# Patient Record
Sex: Female | Born: 1965 | Race: Black or African American | Hispanic: No | Marital: Single | Smoking: Never smoker
Health system: Southern US, Community
[De-identification: ages and names within clinical notes are randomized; demographics above are authoritative.]

## PROBLEM LIST (undated history)

## (undated) DIAGNOSIS — I10 Essential (primary) hypertension: Secondary | ICD-10-CM

## (undated) DIAGNOSIS — J309 Allergic rhinitis, unspecified: Secondary | ICD-10-CM

## (undated) DIAGNOSIS — E785 Hyperlipidemia, unspecified: Secondary | ICD-10-CM

## (undated) DIAGNOSIS — D649 Anemia, unspecified: Secondary | ICD-10-CM

## (undated) DIAGNOSIS — E559 Vitamin D deficiency, unspecified: Secondary | ICD-10-CM

## (undated) DIAGNOSIS — E113293 Type 2 diabetes mellitus with mild nonproliferative diabetic retinopathy without macular edema, bilateral: Secondary | ICD-10-CM

## (undated) DIAGNOSIS — K219 Gastro-esophageal reflux disease without esophagitis: Secondary | ICD-10-CM

## (undated) DIAGNOSIS — E119 Type 2 diabetes mellitus without complications: Secondary | ICD-10-CM

## (undated) HISTORY — PX: ENDOMETRIAL BIOPSY: SHX622

## (undated) HISTORY — DX: Type 2 diabetes mellitus without complications: E11.9

## (undated) HISTORY — DX: Allergic rhinitis, unspecified: J30.9

## (undated) HISTORY — DX: Hyperlipidemia, unspecified: E78.5

## (undated) HISTORY — DX: Gastro-esophageal reflux disease without esophagitis: K21.9

## (undated) HISTORY — DX: Vitamin D deficiency, unspecified: E55.9

## (undated) HISTORY — DX: Anemia, unspecified: D64.9

## (undated) HISTORY — DX: Essential (primary) hypertension: I10

## (undated) HISTORY — DX: Type 2 diabetes mellitus with mild nonproliferative diabetic retinopathy without macular edema, bilateral: E11.3293

---

## 1999-02-08 ENCOUNTER — Ambulatory Visit (HOSPITAL_COMMUNITY): Admission: RE | Admit: 1999-02-08 | Discharge: 1999-02-08 | Payer: Self-pay | Admitting: Pulmonary Disease

## 1999-02-08 ENCOUNTER — Encounter: Payer: Self-pay | Admitting: Pulmonary Disease

## 1999-02-11 ENCOUNTER — Encounter: Payer: Self-pay | Admitting: Pulmonary Disease

## 1999-02-11 ENCOUNTER — Ambulatory Visit (HOSPITAL_COMMUNITY): Admission: RE | Admit: 1999-02-11 | Discharge: 1999-02-11 | Payer: Self-pay | Admitting: Pulmonary Disease

## 1999-08-06 ENCOUNTER — Other Ambulatory Visit: Admission: RE | Admit: 1999-08-06 | Discharge: 1999-08-06 | Payer: Self-pay | Admitting: Obstetrics and Gynecology

## 2000-08-18 ENCOUNTER — Other Ambulatory Visit: Admission: RE | Admit: 2000-08-18 | Discharge: 2000-08-18 | Payer: Self-pay | Admitting: Obstetrics and Gynecology

## 2001-02-18 ENCOUNTER — Encounter (INDEPENDENT_AMBULATORY_CARE_PROVIDER_SITE_OTHER): Payer: Self-pay

## 2001-02-18 ENCOUNTER — Other Ambulatory Visit: Admission: RE | Admit: 2001-02-18 | Discharge: 2001-02-18 | Payer: Self-pay | Admitting: Obstetrics and Gynecology

## 2004-08-05 ENCOUNTER — Ambulatory Visit: Payer: Self-pay | Admitting: Family Medicine

## 2004-08-05 ENCOUNTER — Ambulatory Visit: Payer: Self-pay | Admitting: *Deleted

## 2005-02-24 ENCOUNTER — Ambulatory Visit (HOSPITAL_COMMUNITY): Admission: RE | Admit: 2005-02-24 | Discharge: 2005-02-24 | Payer: Self-pay | Admitting: Family Medicine

## 2005-02-24 ENCOUNTER — Ambulatory Visit: Payer: Self-pay | Admitting: Family Medicine

## 2005-03-06 ENCOUNTER — Ambulatory Visit (HOSPITAL_COMMUNITY): Admission: RE | Admit: 2005-03-06 | Discharge: 2005-03-06 | Payer: Self-pay | Admitting: Family Medicine

## 2005-08-20 ENCOUNTER — Ambulatory Visit: Payer: Self-pay | Admitting: Family Medicine

## 2005-11-09 ENCOUNTER — Ambulatory Visit: Payer: Self-pay | Admitting: Family Medicine

## 2006-01-27 ENCOUNTER — Ambulatory Visit: Payer: Self-pay | Admitting: Family Medicine

## 2006-01-28 ENCOUNTER — Ambulatory Visit: Payer: Self-pay | Admitting: Family Medicine

## 2006-02-02 ENCOUNTER — Ambulatory Visit: Payer: Self-pay | Admitting: Family Medicine

## 2006-06-18 ENCOUNTER — Ambulatory Visit: Payer: Self-pay | Admitting: Family Medicine

## 2006-08-02 ENCOUNTER — Ambulatory Visit: Payer: Self-pay | Admitting: Family Medicine

## 2006-10-27 ENCOUNTER — Ambulatory Visit: Payer: Self-pay | Admitting: Family Medicine

## 2007-01-03 ENCOUNTER — Ambulatory Visit: Payer: Self-pay | Admitting: Family Medicine

## 2007-01-17 ENCOUNTER — Ambulatory Visit: Payer: Self-pay | Admitting: Family Medicine

## 2007-02-22 ENCOUNTER — Ambulatory Visit: Payer: Self-pay | Admitting: Family Medicine

## 2007-05-25 ENCOUNTER — Encounter (INDEPENDENT_AMBULATORY_CARE_PROVIDER_SITE_OTHER): Payer: Self-pay | Admitting: *Deleted

## 2007-05-30 ENCOUNTER — Ambulatory Visit: Payer: Self-pay | Admitting: Internal Medicine

## 2007-06-01 ENCOUNTER — Ambulatory Visit: Payer: Self-pay | Admitting: Internal Medicine

## 2007-06-06 ENCOUNTER — Encounter: Payer: Self-pay | Admitting: Family Medicine

## 2007-06-06 ENCOUNTER — Ambulatory Visit: Payer: Self-pay | Admitting: Internal Medicine

## 2007-06-06 LAB — CONVERTED CEMR LAB
ALT: 17 units/L (ref 0–35)
Albumin: 3.6 g/dL (ref 3.5–5.2)
Alkaline Phosphatase: 39 units/L (ref 39–117)
BUN: 17 mg/dL (ref 6–23)
Basophils Absolute: 0 10*3/uL (ref 0.0–0.1)
Basophils Relative: 0 % (ref 0–1)
Chlamydia, DNA Probe: NEGATIVE
Eosinophils Relative: 1 % (ref 0–5)
Hemoglobin: 11.8 g/dL — ABNORMAL LOW (ref 12.0–15.0)
Lymphocytes Relative: 31 % (ref 12–46)
MCHC: 31.1 g/dL (ref 30.0–36.0)
Monocytes Absolute: 0.5 10*3/uL (ref 0.2–0.7)
Neutro Abs: 4.1 10*3/uL (ref 1.7–7.7)
Neutrophils Relative %: 61 % (ref 43–77)
Platelets: 319 10*3/uL (ref 150–400)
Potassium: 4.3 meq/L (ref 3.5–5.3)
RBC: 4.4 M/uL (ref 3.87–5.11)
RDW: 14.5 % — ABNORMAL HIGH (ref 11.5–14.0)
Sodium: 141 meq/L (ref 135–145)
Total Bilirubin: 0.3 mg/dL (ref 0.3–1.2)
Total Protein: 5.8 g/dL — ABNORMAL LOW (ref 6.0–8.3)
Triglycerides: 88 mg/dL (ref <150)
WBC: 6.8 10*3/uL (ref 4.0–10.5)

## 2007-10-19 ENCOUNTER — Ambulatory Visit: Payer: Self-pay | Admitting: Internal Medicine

## 2007-10-27 ENCOUNTER — Ambulatory Visit: Payer: Self-pay | Admitting: Internal Medicine

## 2007-11-21 ENCOUNTER — Ambulatory Visit: Payer: Self-pay | Admitting: Internal Medicine

## 2007-12-19 ENCOUNTER — Ambulatory Visit: Payer: Self-pay | Admitting: Internal Medicine

## 2008-01-23 ENCOUNTER — Ambulatory Visit: Payer: Self-pay | Admitting: Internal Medicine

## 2008-01-23 ENCOUNTER — Encounter (INDEPENDENT_AMBULATORY_CARE_PROVIDER_SITE_OTHER): Payer: Self-pay | Admitting: Family Medicine

## 2008-01-23 LAB — CONVERTED CEMR LAB
Albumin: 4 g/dL (ref 3.5–5.2)
BUN: 13 mg/dL (ref 6–23)
Calcium: 9.2 mg/dL (ref 8.4–10.5)
Cholesterol: 175 mg/dL (ref 0–200)
LDL Cholesterol: 85 mg/dL (ref 0–99)
Potassium: 3.9 meq/L (ref 3.5–5.3)
Total CHOL/HDL Ratio: 2.9
Total Protein: 7 g/dL (ref 6.0–8.3)
VLDL: 30 mg/dL (ref 0–40)

## 2008-01-24 ENCOUNTER — Ambulatory Visit (HOSPITAL_COMMUNITY): Admission: RE | Admit: 2008-01-24 | Discharge: 2008-01-24 | Payer: Self-pay | Admitting: Family Medicine

## 2008-02-13 ENCOUNTER — Ambulatory Visit: Payer: Self-pay | Admitting: Internal Medicine

## 2008-02-28 ENCOUNTER — Ambulatory Visit: Payer: Self-pay | Admitting: Internal Medicine

## 2008-03-11 ENCOUNTER — Emergency Department (HOSPITAL_COMMUNITY): Admission: EM | Admit: 2008-03-11 | Discharge: 2008-03-11 | Payer: Self-pay | Admitting: *Deleted

## 2008-04-02 ENCOUNTER — Ambulatory Visit: Payer: Self-pay | Admitting: Family Medicine

## 2008-06-11 ENCOUNTER — Ambulatory Visit: Payer: Self-pay | Admitting: Internal Medicine

## 2008-06-26 ENCOUNTER — Encounter (INDEPENDENT_AMBULATORY_CARE_PROVIDER_SITE_OTHER): Payer: Self-pay | Admitting: Family Medicine

## 2008-06-26 ENCOUNTER — Ambulatory Visit: Payer: Self-pay | Admitting: Family Medicine

## 2008-06-26 LAB — CONVERTED CEMR LAB
Basophils Absolute: 0 10*3/uL (ref 0.0–0.1)
CO2: 23 meq/L (ref 19–32)
Eosinophils Absolute: 0.1 10*3/uL (ref 0.0–0.7)
Eosinophils Relative: 1 % (ref 0–5)
HCT: 37.1 % (ref 36.0–46.0)
Helicobacter Pylori Antibody-IgG: 0.4
Hemoglobin: 11.6 g/dL — ABNORMAL LOW (ref 12.0–15.0)
Lymphocytes Relative: 25 % (ref 12–46)
Lymphs Abs: 2 10*3/uL (ref 0.7–4.0)
MCHC: 31.3 g/dL (ref 30.0–36.0)
MCV: 85.7 fL (ref 78.0–100.0)
Monocytes Absolute: 0.6 10*3/uL (ref 0.1–1.0)
Monocytes Relative: 7 % (ref 3–12)
Neutro Abs: 5.5 10*3/uL (ref 1.7–7.7)
Potassium: 3.9 meq/L (ref 3.5–5.3)
RBC: 4.33 M/uL (ref 3.87–5.11)
TSH: 1.01 microintl units/mL (ref 0.350–4.50)
Total Protein: 6.4 g/dL (ref 6.0–8.3)
WBC: 8.2 10*3/uL (ref 4.0–10.5)

## 2008-07-09 ENCOUNTER — Ambulatory Visit (HOSPITAL_COMMUNITY): Admission: RE | Admit: 2008-07-09 | Discharge: 2008-07-09 | Payer: Self-pay | Admitting: Family Medicine

## 2008-08-20 ENCOUNTER — Ambulatory Visit (HOSPITAL_COMMUNITY): Admission: RE | Admit: 2008-08-20 | Discharge: 2008-08-20 | Payer: Self-pay | Admitting: Internal Medicine

## 2008-08-20 ENCOUNTER — Ambulatory Visit: Payer: Self-pay | Admitting: Internal Medicine

## 2008-09-17 ENCOUNTER — Ambulatory Visit (HOSPITAL_COMMUNITY): Admission: RE | Admit: 2008-09-17 | Discharge: 2008-09-17 | Payer: Self-pay | Admitting: Internal Medicine

## 2008-10-25 ENCOUNTER — Encounter (INDEPENDENT_AMBULATORY_CARE_PROVIDER_SITE_OTHER): Payer: Self-pay | Admitting: Family Medicine

## 2008-10-25 ENCOUNTER — Ambulatory Visit: Payer: Self-pay | Admitting: Internal Medicine

## 2008-10-25 LAB — CONVERTED CEMR LAB

## 2008-11-26 ENCOUNTER — Ambulatory Visit: Payer: Self-pay | Admitting: Internal Medicine

## 2008-11-27 ENCOUNTER — Ambulatory Visit: Payer: Self-pay | Admitting: Internal Medicine

## 2009-01-21 ENCOUNTER — Ambulatory Visit: Payer: Self-pay | Admitting: Internal Medicine

## 2009-02-18 ENCOUNTER — Ambulatory Visit: Payer: Self-pay | Admitting: Internal Medicine

## 2009-04-01 ENCOUNTER — Ambulatory Visit: Payer: Self-pay | Admitting: Internal Medicine

## 2009-05-20 ENCOUNTER — Encounter (INDEPENDENT_AMBULATORY_CARE_PROVIDER_SITE_OTHER): Payer: Self-pay | Admitting: Adult Health

## 2009-05-20 ENCOUNTER — Ambulatory Visit: Payer: Self-pay | Admitting: Internal Medicine

## 2009-05-20 LAB — CONVERTED CEMR LAB
AST: 15 units/L (ref 0–37)
Albumin: 4.4 g/dL (ref 3.5–5.2)
Alkaline Phosphatase: 52 units/L (ref 39–117)
Basophils Absolute: 0 10*3/uL (ref 0.0–0.1)
Basophils Relative: 0 % (ref 0–1)
CO2: 20 meq/L (ref 19–32)
Chloride: 107 meq/L (ref 96–112)
Eosinophils Relative: 0 % (ref 0–5)
Lymphocytes Relative: 21 % (ref 12–46)
Lymphs Abs: 1.8 10*3/uL (ref 0.7–4.0)
MCHC: 30.9 g/dL (ref 30.0–36.0)
MCV: 79.3 fL (ref 78.0–100.0)
Neutro Abs: 6.1 10*3/uL (ref 1.7–7.7)
Potassium: 4.2 meq/L (ref 3.5–5.3)
RBC: 4.45 M/uL (ref 3.87–5.11)
RDW: 15.8 % — ABNORMAL HIGH (ref 11.5–15.5)
Sodium: 141 meq/L (ref 135–145)
Total Bilirubin: 0.3 mg/dL (ref 0.3–1.2)
Total Protein: 7.5 g/dL (ref 6.0–8.3)
WBC: 8.6 10*3/uL (ref 4.0–10.5)

## 2009-05-23 ENCOUNTER — Ambulatory Visit: Payer: Self-pay | Admitting: Internal Medicine

## 2009-06-17 ENCOUNTER — Ambulatory Visit: Payer: Self-pay | Admitting: Internal Medicine

## 2009-06-17 ENCOUNTER — Encounter (INDEPENDENT_AMBULATORY_CARE_PROVIDER_SITE_OTHER): Payer: Self-pay | Admitting: Adult Health

## 2009-06-17 ENCOUNTER — Encounter: Payer: Self-pay | Admitting: Internal Medicine

## 2009-06-17 LAB — CONVERTED CEMR LAB
Basophils Absolute: 0 10*3/uL (ref 0.0–0.1)
Basophils Relative: 1 % (ref 0–1)
Hemoglobin: 10.4 g/dL — ABNORMAL LOW (ref 12.0–15.0)
Lymphocytes Relative: 25 % (ref 12–46)
MCHC: 29.8 g/dL — ABNORMAL LOW (ref 30.0–36.0)
Monocytes Absolute: 0.7 10*3/uL (ref 0.1–1.0)
Neutrophils Relative %: 63 % (ref 43–77)
Platelets: 379 10*3/uL (ref 150–400)
RDW: 15.9 % — ABNORMAL HIGH (ref 11.5–15.5)
TSH: 1.012 microintl units/mL (ref 0.350–4.500)
WBC: 6.7 10*3/uL (ref 4.0–10.5)

## 2009-08-19 ENCOUNTER — Ambulatory Visit: Payer: Self-pay | Admitting: Internal Medicine

## 2009-08-19 ENCOUNTER — Encounter (INDEPENDENT_AMBULATORY_CARE_PROVIDER_SITE_OTHER): Payer: Self-pay | Admitting: Adult Health

## 2009-08-19 LAB — CONVERTED CEMR LAB
AST: 12 units/L (ref 0–37)
Albumin: 4.3 g/dL (ref 3.5–5.2)
BUN: 16 mg/dL (ref 6–23)
Basophils Absolute: 0 10*3/uL (ref 0.0–0.1)
Chloride: 103 meq/L (ref 96–112)
Eosinophils Relative: 1 % (ref 0–5)
HCT: 37.3 % (ref 36.0–46.0)
Hemoglobin: 11.4 g/dL — ABNORMAL LOW (ref 12.0–15.0)
MCHC: 30.6 g/dL (ref 30.0–36.0)
Neutro Abs: 6.1 10*3/uL (ref 1.7–7.7)
Neutrophils Relative %: 69 % (ref 43–77)
Potassium: 4 meq/L (ref 3.5–5.3)
RBC: 4.63 M/uL (ref 3.87–5.11)
Sodium: 138 meq/L (ref 135–145)
Total Bilirubin: 0.3 mg/dL (ref 0.3–1.2)
WBC: 8.8 10*3/uL (ref 4.0–10.5)

## 2009-08-22 ENCOUNTER — Encounter (INDEPENDENT_AMBULATORY_CARE_PROVIDER_SITE_OTHER): Payer: Self-pay | Admitting: Adult Health

## 2009-11-18 ENCOUNTER — Ambulatory Visit: Payer: Self-pay | Admitting: Internal Medicine

## 2009-11-18 ENCOUNTER — Encounter (INDEPENDENT_AMBULATORY_CARE_PROVIDER_SITE_OTHER): Payer: Self-pay | Admitting: Adult Health

## 2009-11-18 LAB — CONVERTED CEMR LAB
Alkaline Phosphatase: 51 units/L (ref 39–117)
BUN: 15 mg/dL (ref 6–23)
Calcium: 9.1 mg/dL (ref 8.4–10.5)
Chloride: 103 meq/L (ref 96–112)
Cholesterol: 163 mg/dL (ref 0–200)
Creatinine, Ser: 0.7 mg/dL (ref 0.40–1.20)
LDL Cholesterol: 89 mg/dL (ref 0–99)
Potassium: 4.5 meq/L (ref 3.5–5.3)
Total CHOL/HDL Ratio: 2.5
Total Protein: 7 g/dL (ref 6.0–8.3)
Triglycerides: 51 mg/dL (ref <150)

## 2009-12-02 ENCOUNTER — Ambulatory Visit (HOSPITAL_COMMUNITY): Admission: RE | Admit: 2009-12-02 | Discharge: 2009-12-02 | Payer: Self-pay | Admitting: Internal Medicine

## 2009-12-02 ENCOUNTER — Ambulatory Visit: Payer: Self-pay | Admitting: Internal Medicine

## 2010-02-17 ENCOUNTER — Encounter (INDEPENDENT_AMBULATORY_CARE_PROVIDER_SITE_OTHER): Payer: Self-pay | Admitting: Adult Health

## 2010-02-17 ENCOUNTER — Ambulatory Visit: Payer: Self-pay | Admitting: Internal Medicine

## 2010-02-17 LAB — CONVERTED CEMR LAB
Eosinophils Relative: 1 % (ref 0–5)
HCT: 37.6 % (ref 36.0–46.0)
Lymphs Abs: 1.7 10*3/uL (ref 0.7–4.0)
MCHC: 32.2 g/dL (ref 30.0–36.0)
Monocytes Absolute: 0.8 10*3/uL (ref 0.1–1.0)
Vit D, 25-Hydroxy: 29 ng/mL — ABNORMAL LOW (ref 30–89)
WBC: 6.9 10*3/uL (ref 4.0–10.5)

## 2010-02-27 ENCOUNTER — Ambulatory Visit: Payer: Self-pay | Admitting: Internal Medicine

## 2010-02-27 ENCOUNTER — Telehealth (INDEPENDENT_AMBULATORY_CARE_PROVIDER_SITE_OTHER): Payer: Self-pay | Admitting: *Deleted

## 2010-02-28 ENCOUNTER — Encounter (INDEPENDENT_AMBULATORY_CARE_PROVIDER_SITE_OTHER): Payer: Self-pay | Admitting: Internal Medicine

## 2010-03-14 ENCOUNTER — Ambulatory Visit: Payer: Self-pay | Admitting: Family Medicine

## 2010-07-21 ENCOUNTER — Encounter (INDEPENDENT_AMBULATORY_CARE_PROVIDER_SITE_OTHER): Payer: Self-pay | Admitting: *Deleted

## 2010-07-21 LAB — CONVERTED CEMR LAB
BUN: 15 mg/dL (ref 6–23)
Calcium: 9.2 mg/dL (ref 8.4–10.5)
Creatinine, Ser: 0.69 mg/dL (ref 0.40–1.20)
MCV: 83.1 fL (ref 78.0–100.0)
Platelets: 363 10*3/uL (ref 150–400)
Potassium: 4 meq/L (ref 3.5–5.3)
RBC: 4.33 M/uL (ref 3.87–5.11)
RDW: 14.9 % (ref 11.5–15.5)
WBC: 6.7 10*3/uL (ref 4.0–10.5)

## 2010-07-24 ENCOUNTER — Encounter (INDEPENDENT_AMBULATORY_CARE_PROVIDER_SITE_OTHER): Payer: Self-pay | Admitting: *Deleted

## 2010-07-24 LAB — CONVERTED CEMR LAB: Ferritin: 27 ng/mL (ref 10–291)

## 2010-08-08 ENCOUNTER — Encounter: Admission: RE | Admit: 2010-08-08 | Discharge: 2010-08-08 | Payer: Self-pay | Admitting: Cardiovascular Disease

## 2010-08-08 ENCOUNTER — Ambulatory Visit: Payer: Self-pay | Admitting: Cardiovascular Disease

## 2010-09-18 ENCOUNTER — Telehealth (INDEPENDENT_AMBULATORY_CARE_PROVIDER_SITE_OTHER): Payer: Self-pay | Admitting: *Deleted

## 2010-09-19 ENCOUNTER — Ambulatory Visit (HOSPITAL_COMMUNITY)
Admission: RE | Admit: 2010-09-19 | Discharge: 2010-09-19 | Payer: Self-pay | Source: Home / Self Care | Attending: Cardiovascular Disease | Admitting: Cardiovascular Disease

## 2010-09-19 ENCOUNTER — Ambulatory Visit: Admit: 2010-09-19 | Payer: Self-pay

## 2010-09-19 ENCOUNTER — Encounter: Payer: Self-pay | Admitting: Cardiovascular Disease

## 2010-09-19 ENCOUNTER — Ambulatory Visit: Admission: RE | Admit: 2010-09-19 | Discharge: 2010-09-19 | Payer: Self-pay | Source: Home / Self Care

## 2010-10-03 ENCOUNTER — Encounter: Payer: Self-pay | Admitting: Cardiovascular Disease

## 2010-10-03 DIAGNOSIS — J45909 Unspecified asthma, uncomplicated: Secondary | ICD-10-CM | POA: Insufficient documentation

## 2010-10-03 DIAGNOSIS — K219 Gastro-esophageal reflux disease without esophagitis: Secondary | ICD-10-CM | POA: Insufficient documentation

## 2010-10-03 DIAGNOSIS — I10 Essential (primary) hypertension: Secondary | ICD-10-CM | POA: Insufficient documentation

## 2010-10-03 DIAGNOSIS — D649 Anemia, unspecified: Secondary | ICD-10-CM | POA: Insufficient documentation

## 2010-10-03 DIAGNOSIS — E119 Type 2 diabetes mellitus without complications: Secondary | ICD-10-CM | POA: Insufficient documentation

## 2010-10-07 NOTE — Progress Notes (Signed)
Summary: triage/abnormal vaginal bleeding  Phone Note Call from Patient   Caller: Patient Reason for Call: Talk to Nurse Summary of Call: Patient called on call nurse last night to report abnormal vaginal bleeding..She is 45 years old. Patient states she has been having her period since 02/15/10 and that she is now having bright red blood and using super tampons every 2 hours. Patient put in acute slot with Dr. Pamalee Leyden. Initial call taken by: Conchita Paris,  February 27, 2010 9:02 AM

## 2010-10-09 NOTE — Progress Notes (Signed)
Summary: stress echo appt  Phone Note Outgoing Call Call back at [facility] Phone [phone]   Call placed by: Stanton Kidney, EMT-P,  September 18, 2010 1:10 PM Action Taken: Phone Call Completed Summary of Call: Left message reference stress echo appt. Stanton Kidney, EMT-P  September 18, 2010 1:10 PM

## 2010-11-25 ENCOUNTER — Encounter: Payer: Self-pay | Admitting: Internal Medicine

## 2010-11-25 LAB — CONVERTED CEMR LAB
ALT: 22 units/L (ref 0–35)
AST: 16 units/L (ref 0–37)
Alkaline Phosphatase: 74 units/L (ref 39–117)
BUN: 15 mg/dL (ref 6–23)
Basophils Absolute: 0 10*3/uL (ref 0.0–0.1)
Creatinine, Ser: 0.7 mg/dL (ref 0.40–1.20)
Glucose, Bld: 231 mg/dL — ABNORMAL HIGH (ref 70–99)
Hemoglobin: 12.1 g/dL (ref 12.0–15.0)
Lymphocytes Relative: 29 % (ref 12–46)
Lymphs Abs: 1.7 10*3/uL (ref 0.7–4.0)
MCHC: 31 g/dL (ref 30.0–36.0)
Monocytes Absolute: 0.5 10*3/uL (ref 0.1–1.0)
Monocytes Relative: 8 % (ref 3–12)
Neutro Abs: 3.5 10*3/uL (ref 1.7–7.7)
Neutrophils Relative %: 61 % (ref 43–77)
Platelets: 331 10*3/uL (ref 150–400)
Potassium: 4.2 meq/L (ref 3.5–5.3)
Sed Rate: 5 mm/hr (ref 0–22)
Total Bilirubin: 0.4 mg/dL (ref 0.3–1.2)
Total Protein: 7.6 g/dL (ref 6.0–8.3)
WBC: 5.8 10*3/uL (ref 4.0–10.5)

## 2010-11-27 ENCOUNTER — Ambulatory Visit: Payer: Self-pay | Admitting: Cardiovascular Disease

## 2010-12-29 ENCOUNTER — Encounter: Payer: Self-pay | Admitting: Cardiovascular Disease

## 2010-12-29 ENCOUNTER — Ambulatory Visit (INDEPENDENT_AMBULATORY_CARE_PROVIDER_SITE_OTHER): Payer: Self-pay | Admitting: Cardiovascular Disease

## 2010-12-29 VITALS — BP 142/98 | HR 102 | Ht 64.0 in | Wt 224.0 lb

## 2010-12-29 DIAGNOSIS — I10 Essential (primary) hypertension: Secondary | ICD-10-CM

## 2010-12-29 MED ORDER — METOPROLOL TARTRATE 25 MG PO TABS: 25.0000 mg | ORAL_TABLET | Freq: Two times a day (BID) | ORAL | Status: DC

## 2010-12-29 NOTE — Progress Notes (Signed)
Huntley Dec Date of Birth  07-Mar-1966 [facility] [address]     [address] [place], [place]  [address] [phone]  Fax  [phone]  History of Present Illness:  Kristine Bauer is a middle-aged female with a history of hypertension and tachycardia. She also has had some atypical chest pain. She was sent for stress echocardiogram which was negative for ischemia. I felt that her symptoms sounded more like gastroesophageal reflux disease.  She's not had any recent episodes of chest pain. She does have a chronic cough which she relates to being surrounded by lots of the perfume and other respiratory irritants such as cigarette smoke.  She does not get any regular exercise. She does not have any episodes of chest pain or shortness breath.  Current Outpatient Prescriptions on File Prior to Visit  Medication Sig Dispense Refill  . Albuterol (VENTOLIN IN) Inhale into the lungs as needed.        Marland Kitchen amLODipine (NORVASC) 10 MG tablet Take 10 mg by mouth daily.        . benazepril-hydrochlorthiazide (LOTENSIN HCT) 20-12.5 MG per tablet Take 1 tablet by mouth daily.        . ferrous sulfate 325 (65 FE) MG tablet Take 325 mg by mouth daily.        . fluticasone (FLONASE) 50 MCG/ACT nasal spray 1 spray by Nasal route daily.        . Fluticasone-Salmeterol (ADVAIR DISKUS) 250-50 MCG/DOSE AEPB Inhale 1 puff into the lungs 2 (two) times daily.       Marland Kitchen glimepiride (AMARYL) 4 MG tablet Take 4 mg by mouth 2 (two) times daily.       . Loratadine-Pseudoephedrine (ALLERGY RELIEF D PO) Take by mouth as needed.        . Multiple Vitamins-Iron (DAILY MULTIVITAMINS/IRON PO) Take by mouth daily.        . pantoprazole (PROTONIX) 40 MG tablet Take 40 mg by mouth daily.          No Known Allergies  Past Medical History  Diagnosis Date  . Chest pain     LEFT SIDED-SEVERAL YEARS  . Diabetes mellitus, type 2   . HTN (hypertension)   . Asthma   . GERD (gastroesophageal reflux  disease)   . Anemia     Past Surgical History  Procedure Date  . Endometrial biopsy     History  Smoking status  . Never Smoker   Smokeless tobacco  . Not on file    History  Alcohol Use No    Family History  Problem Relation Age of Onset  . Hypertension Father   . Glaucoma Father   . Hypertension Mother   . Diabetes type II Mother   . Multiple sclerosis Sister     Reviw of Systems:  Reviewed in the HPI.  All other systems are negative.  Physical Exam: BP 142/98  Pulse 102  Ht 5\' 4"  (1.626 m)  Wt 224 lb (101.606 kg)  BMI 38.45 kg/m2  LMP 12/18/2010 The patient is alert and oriented x 3.  The mood and affect are normal.  The skin is warm and dry.  Color is normal.  The HEENT exam reveals that the sclera are nonicteric.  The mucous membranes are moist.  The carotids are 2+ without bruits.  There is no thyromegaly.  There is no JVD.  The lungs are clear.  The chest wall is non tender.  The heart exam reveals a  regular rate with a normal S1 and S2.  There are no murmurs, gallops, or rubs.  The PMI is not displaced.   Abdominal exam reveals good bowel sounds.  There is no guarding or rebound.  There is no hepatosplenomegaly or tenderness.  There are no masses.  Exam of the legs reveal no clubbing, cyanosis, or edema.  The legs are without rashes.  The distal pulses are intact.  Cranial nerves II - XII are intact.  Motor and sensory functions are intact.  The gait is normal.  Assessment / Plan:

## 2010-12-29 NOTE — Assessment & Plan Note (Signed)
Kristine Bauer presents with moderate hypertension and also some tachycardia. We'll start her on metoprolol 25 mg twice a day.  In talking with her, it is clear that she still to fairly high salt diet. She sprinkle salt and Lawry's seasoning salt A lot of the food.  I've asked her to cut out a lot of her salt. I've asked her to start a regular exercise program and to start by walking 1 mile a day. Given her a handout on low salt diets. I'll see her back in 3 months.

## 2010-12-29 NOTE — Patient Instructions (Signed)
1.5 Gram Low Sodium Diet A 1.5 gram sodium diet restricts the amount of sodium in the diet to no more than 1.5 grams (g) or 1500 milligrams (mg) daily. The American Heart Association recommends Americans over the age of 20 to consume no more than 1500 mg of sodium each day to reduce the risk of developing high blood pressure. Research also shows that limiting sodium may reduce heart attack and stroke risk. Many foods contain sodium for flavor and sometimes as a preservative. When the amount of sodium in a diet needs to be low, it is important to know what to look for when choosing foods and drinks. The following includes some information and guidelines to help make it easier for you to adapt to a low sodium diet. QUICK TIPS  Do not add salt to food.   Avoid convenience items and fast food.   Choose unsalted snack foods.   Buy lower sodium products, often labeled as "lower sodium" or "no salt added."   Check food labels to learn how much sodium is in 1 serving.   When eating at a restaurant, ask that your food be prepared with less salt or none, if possible.  READING FOOD LABELS FOR SODIUM INFORMATION The nutrition facts label is a good place to find how much sodium is in foods. Look for products with no more than 400 mg of sodium per serving. Remember that 1.5 g = 1500 mg. The food label may also list foods as:  Sodium-free: Less than 5 mg in a serving.   Very low sodium: 35 mg or less in a serving.   Low-sodium: 140 mg or less in a serving.   Light in sodium: 50% less sodium in a serving. For example, if a food that usually has 300 mg of sodium is changed to become light in sodium, it will have 150 mg of sodium.   Reduced sodium: 25% less sodium in a serving. For example, if a food that usually has 400 mg of sodium is changed to reduced sodium, it will have 300 mg of sodium.  CHOOSING FOODS AVOID  CHOOSE   Grains:  Salted crackers and snack items. Some cereals, including instant hot  cereals. Bread stuffing and biscuit mixes. Seasoned rice or pasta mixes.  Grains:  Unsalted snack items. Low-sodium cereals, oats, puffed wheat and rice, shredded wheat. English muffins and bread. Pasta.   Meats:  Salted, canned, smoked, spiced, pickled meats, including fish and poultry. Bacon, ham, sausage, cold cuts, hot dogs, anchovies.  Meats:  Low-sodium canned tuna and salmon. Fresh or frozen meat, poultry, and fish.   Dairy:  Processed cheese and spreads. Cottage cheese. Buttermilk and condensed milk. Regular cheese.  Dairy:  Milk. Low-sodium cottage cheese. Yogurt. Sour cream. Low-sodium cheese.   Fruits and Vegetables:  Regular canned vegetables. Regular canned tomato sauce and paste. Frozen vegetables in sauces. Olives. Pickles. Relishes. Sauerkraut.  Fruits and Vegetables:  Low-sodium canned vegetables. Low-sodium tomato sauce and paste. Frozen or fresh vegetables. Fresh and frozen fruit.   Condiments:  Canned and packaged gravies. Worcestershire sauce. Tartar sauce. Barbecue sauce. Soy sauce. Steak sauce. Ketchup. Onion, garlic, and table salt. Meat flavorings and tenderizers.  Condiments:  Fresh and dried herbs and spices. Low-sodium varieties of mustard and ketchup. Lemon juice. Tabasco sauce. Horseradish.   SAMPLE 1.5 GRAM SODIUM MEAL PLAN Meal  Foods  Sodium (mg)   Breakfast  1 cup low-fat milk  1 whole-wheat English muffin 1 tablespoon heart-healthy margarine 1 hard-boiled egg   1 small orange  143  [phone] 0   Lunch  1 cup raw carrots  2 tablespoons no salt added peanut butter 2 slices whole-wheat bread 1 tablespoon jelly  cup red grapes  76  5 270 6 2   Dinner  1 cup whole-wheat pasta  1 cup low-sodium tomato sauce 3 ounces (oz) lean ground beef 1 small side salad (1 cup raw spinach leaves,  cup cucumber,  cup yellow bell pepper) with 1 teaspoon olive oil and 1 teaspoon red wine vinegar  2  73 57 25   Snack  1  container low-fat vanilla yogurt  3 graham cracker squares  107  127   Nutrient Analysis Calories: 1745 Protein: 75 g Carbohydrate: 237 g Fat: 57 g Sodium: 1425 mg Information from www.eatright.org. Document Released: 08/24/2005 Document Re-Released: 02/11/2010 [employer] Patient Information 2011 [employer]. 

## 2011-03-30 ENCOUNTER — Ambulatory Visit (INDEPENDENT_AMBULATORY_CARE_PROVIDER_SITE_OTHER): Payer: Self-pay | Admitting: Cardiovascular Disease

## 2011-03-30 ENCOUNTER — Encounter: Payer: Self-pay | Admitting: Cardiovascular Disease

## 2011-03-30 VITALS — BP 120/90 | HR 72 | Ht 64.0 in | Wt 225.0 lb

## 2011-03-30 DIAGNOSIS — I10 Essential (primary) hypertension: Secondary | ICD-10-CM

## 2011-03-30 DIAGNOSIS — R079 Chest pain, unspecified: Secondary | ICD-10-CM

## 2011-03-30 MED ORDER — METOPROLOL TARTRATE 25 MG PO TABS: 25.0000 mg | ORAL_TABLET | Freq: Two times a day (BID) | ORAL | Status: DC

## 2011-03-30 NOTE — Assessment & Plan Note (Signed)
Kristine Bauer has not had any episodes of chest pain quite some time. I think that these are relatively atypical and I do not think that she has any significant coronary artery disease.

## 2011-03-30 NOTE — Assessment & Plan Note (Signed)
Kristine Bauer still eats a high fat high salt 5. I've encouraged her to stay away from fatty foods. I've asked her to bake food instead of putting in the deep fat fryer.

## 2011-03-30 NOTE — Progress Notes (Signed)
Kristine Bauer Date of Birth  [birth year] [facility] [address]     [address] [place], [place]  [address] [phone]  Fax  [phone]  History of Present Illness:  Kristine Bauer is a middle-aged female with a history of left-sided chest pain for the past many years. She also has a history of hypertension, gastroesophageal reflux disease, and type 2 diabetes mellitus. She seems a little depressed today. She has not been out exercise. She does not having her current episodes of chest pain. She's been trying to stick to her diet but she's had difficulty in staying away from fried foods.  Current Outpatient Prescriptions on File Prior to Visit  Medication Sig Dispense Refill  . Albuterol (VENTOLIN IN) Inhale into the lungs as needed.        Marland Kitchen amLODipine (NORVASC) 10 MG tablet Take 10 mg by mouth daily.        . benazepril-hydrochlorthiazide (LOTENSIN HCT) 20-12.5 MG per tablet Take 1 tablet by mouth daily.        . ferrous sulfate 325 (65 FE) MG tablet Take 325 mg by mouth daily.        . Fluticasone-Salmeterol (ADVAIR DISKUS) 250-50 MCG/DOSE AEPB Inhale 1 puff into the lungs 2 (two) times daily.       Marland Kitchen gabapentin (NEURONTIN) 300 MG capsule Take 300 mg by mouth as needed.        . metoprolol tartrate (LOPRESSOR) 25 MG tablet Take 1 tablet (25 mg total) by mouth 2 (two) times daily.  60 tablet  11  . Multiple Vitamins-Iron (DAILY MULTIVITAMINS/IRON PO) Take by mouth daily.        Marland Kitchen glimepiride (AMARYL) 4 MG tablet Take 4 mg by mouth 2 (two) times daily.         No Known Allergies  Past Medical History  Diagnosis Date  . Chest pain     LEFT SIDED-SEVERAL YEARS  . Diabetes mellitus, type 2   . HTN (hypertension)   . Asthma   . GERD (gastroesophageal reflux disease)   . Anemia     Past Surgical History  Procedure Date  . Endometrial biopsy     History  Smoking status  . Never Smoker   Smokeless tobacco  . Not on file    History    Alcohol Use No    Family History  Problem Relation Age of Onset  . Hypertension Father   . Glaucoma Father   . Hypertension Mother   . Diabetes type II Mother   . Multiple sclerosis Sister     Reviw of Systems:  Reviewed in the HPI.  All other systems are negative.  Physical Exam: BP 120/90  Pulse 72  Ht 5\' 4"  (1.626 m)  Wt 225 lb (102.059 kg)  BMI 38.62 kg/m2 The patient is alert and oriented x 3.  The mood and affect are normal.   Skin: warm and dry.  Color is normal.    HEENT:   the sclera are nonicteric.  The mucous membranes are moist.  The carotids are 2+ without bruits.  There is no thyromegaly.  There is no JVD.    Lungs: clear.  The chest wall is non tender.    Heart: regular rate with a normal S1 and S2.  There is a soft systolic murmur. The PMI is not displaced.     Abdomen: good bowel sounds.  There is no guarding or rebound.  There is no hepatosplenomegaly or tenderness.  There are no masses.   Extremities:  no clubbing, cyanosis, or edema.  The legs are without rashes.  The distal pulses are intact.   Neuro:  Cranial nerves II - XII are intact.  Motor and sensory functions are intact.    The gait is normal.   Assessment / Plan:

## 2011-06-08 ENCOUNTER — Other Ambulatory Visit (HOSPITAL_COMMUNITY): Payer: Self-pay | Admitting: Family Medicine

## 2011-06-08 DIAGNOSIS — Z1231 Encounter for screening mammogram for malignant neoplasm of breast: Secondary | ICD-10-CM

## 2011-06-15 ENCOUNTER — Ambulatory Visit (HOSPITAL_COMMUNITY): Payer: Self-pay

## 2011-06-29 ENCOUNTER — Ambulatory Visit (HOSPITAL_COMMUNITY)
Admission: RE | Admit: 2011-06-29 | Discharge: 2011-06-29 | Disposition: A | Discharge status: Home / Self Care | Payer: Self-pay | Source: Ambulatory Visit | Attending: Family Medicine | Admitting: Family Medicine

## 2011-06-29 DIAGNOSIS — Z1231 Encounter for screening mammogram for malignant neoplasm of breast: Secondary | ICD-10-CM | POA: Insufficient documentation

## 2011-09-11 ENCOUNTER — Encounter (HOSPITAL_COMMUNITY): Payer: Self-pay

## 2011-09-11 ENCOUNTER — Emergency Department (INDEPENDENT_AMBULATORY_CARE_PROVIDER_SITE_OTHER)
Admission: EM | Admit: 2011-09-11 | Discharge: 2011-09-11 | Disposition: A | Discharge status: Home / Self Care | Payer: Self-pay | Source: Home / Self Care | Attending: Emergency Medicine | Admitting: Emergency Medicine

## 2011-09-11 ENCOUNTER — Emergency Department (INDEPENDENT_AMBULATORY_CARE_PROVIDER_SITE_OTHER): Payer: Self-pay

## 2011-09-11 DIAGNOSIS — M778 Other enthesopathies, not elsewhere classified: Secondary | ICD-10-CM

## 2011-09-11 DIAGNOSIS — M65839 Other synovitis and tenosynovitis, unspecified forearm: Secondary | ICD-10-CM

## 2011-09-11 MED ORDER — MELOXICAM 7.5 MG PO TABS: 7.5000 mg | ORAL_TABLET | Freq: Every day | ORAL | Status: AC

## 2011-09-11 NOTE — ED Notes (Signed)
C/o rt wrist and thumb pain for 2 months.  Denies injury.  Taking ibuprofen without relief.

## 2011-09-11 NOTE — ED Provider Notes (Addendum)
History     CSN: [id number]  Arrival date & time 09/11/11  1155   First MD Initiated Contact with Patient 09/11/11 1409      Chief Complaint  Patient presents with  . Hand Pain    (Consider location/radiation/quality/duration/timing/severity/associated sxs/prior treatment) HPI Comments: For about 2 months , my thumb area has been hurting, I have not fallen or have any injury that I remember" I do write and use my hands a lot"   Patient is a 46 y.o. female presenting with hand pain. The history is provided by the patient.  Hand Pain This is a new problem. The current episode started more than 1 week ago (x 2 months). The problem has not changed since onset.The symptoms are aggravated by bending. The symptoms are relieved by ice. Treatments tried: motrin. The treatment provided no relief.    Past Medical History  Diagnosis Date  . Chest pain     LEFT SIDED-SEVERAL YEARS  . Diabetes mellitus, type 2   . HTN (hypertension)   . Asthma   . GERD (gastroesophageal reflux disease)   . Anemia     Past Surgical History  Procedure Date  . Endometrial biopsy     Family History  Problem Relation Age of Onset  . Hypertension Father   . Glaucoma Father   . Hypertension Mother   . Diabetes type II Mother   . Multiple sclerosis Sister     History  Substance Use Topics  . Smoking status: Never Smoker   . Smokeless tobacco: Not on file  . Alcohol Use: No    OB History    Grav Para Term Preterm Abortions TAB SAB Ect Mult Living                  Review of Systems  Constitutional: Negative for fever.  Skin: Negative for rash.  Neurological: Negative for weakness.    Allergies  Review of patient's allergies indicates no known allergies.  Home Medications   Current Outpatient Rx  Name Route Sig Dispense Refill  . VENTOLIN IN Inhalation Inhale into the lungs as needed.      Marland Kitchen AMLODIPINE BESYLATE 10 MG PO TABS Oral Take 10 mg by mouth daily.      Marland Kitchen  BENAZEPRIL-HYDROCHLOROTHIAZIDE 20-12.5 MG PO TABS Oral Take 1 tablet by mouth daily.      . CHLORPHENIRAMINE MALEATE 4 MG PO TABS Oral Take 4 mg by mouth.      Marland Kitchen FLUTICASONE-SALMETEROL 250-50 MCG/DOSE IN AEPB Inhalation Inhale 1 puff into the lungs 2 (two) times daily.     Marland Kitchen GLIMEPIRIDE 4 MG PO TABS Oral Take 4 mg by mouth 2 (two) times daily.     Marland Kitchen DAILY MULTIVITAMINS/IRON PO Oral Take by mouth daily.      Marland Kitchen SITAGLIPTIN PHOSPHATE 100 MG PO TABS Oral Take 100 mg by mouth daily.      Marland Kitchen CETIRIZINE HCL 10 MG PO TABS Oral Take 10 mg by mouth daily.      Marland Kitchen FERROUS SULFATE 325 (65 FE) MG PO TABS Oral Take 325 mg by mouth daily.      Marland Kitchen GABAPENTIN 300 MG PO CAPS Oral Take 300 mg by mouth as needed.      . MELOXICAM 7.5 MG PO TABS Oral Take 1 tablet (7.5 mg total) by mouth daily. 14 tablet 0  . METOPROLOL TARTRATE 25 MG PO TABS Oral Take 1 tablet (25 mg total) by mouth 2 (two) times daily. 180 tablet 3  BP 124/82  Pulse 82  Temp(Src) 97.7 F (36.5 C) (Oral)  Resp 18  SpO2 100%  LMP 08/29/2011  Physical Exam  Musculoskeletal: She exhibits tenderness.       Hands: Skin: Skin is warm. No erythema.    ED Course  Procedures (including critical care time)  Labs Reviewed - No data to display Dg Hand Complete Right  09/11/2011  *RADIOLOGY REPORT*  Clinical Data: Right hand pain  RIGHT HAND - COMPLETE 3+ VIEW  Comparison: None.  Findings: No fracture or dislocation is seen.  The joint spaces are preserved.  The visualized soft tissues are unremarkable.  IMPRESSION: No acute osseous abnormality is seen.  Original Report Authenticated By: Charline Bills, M.D.     1. Tendinitis of right wrist       MDM  R thumb and and radial distal region tenderness (atraumatic). Normal x-rays        [name], MD 09/11/11 1801  [name], MD 09/11/11 [phone]

## 2012-03-21 ENCOUNTER — Ambulatory Visit (INDEPENDENT_AMBULATORY_CARE_PROVIDER_SITE_OTHER): Payer: BC Managed Care – PPO | Admitting: Family Medicine

## 2012-03-21 VITALS — BP 124/76 | HR 85 | Temp 98.3°F | Resp 18 | Ht 63.0 in | Wt 227.4 lb

## 2012-03-21 DIAGNOSIS — R05 Cough: Secondary | ICD-10-CM

## 2012-03-21 DIAGNOSIS — J45909 Unspecified asthma, uncomplicated: Secondary | ICD-10-CM

## 2012-03-21 MED ORDER — AZITHROMYCIN 250 MG PO TABS: ORAL_TABLET | ORAL | Status: AC

## 2012-03-21 MED ORDER — FLUTICASONE FUROATE 27.5 MCG/SPRAY NA SUSP: 2.0000 | Freq: Every day | NASAL | Status: DC

## 2012-03-21 NOTE — Progress Notes (Signed)
Date:  03/21/2012   Name:  Kristine Bauer   DOB:  02/21/1966   MRN:  [id number]  PCP:  Norberto Sorenson, MD    Chief Complaint: Cough, Headache and Sinusitis   History of Present Illness:  Kristine Bauer is a 46 y.o. very pleasant female patient who presents with the following:  She has noted a cough, right sided HA, sneezing, ears are itchy/ draining, throat is itching.  She has also noted some post- tussive emesis.  She has not noted a fever.  No chills.  She did have some body aches a couple of days ago.  She thinks she has noted a "whoop" after she coughs sometimes.    She has a history of ?asthma vs RAD, as well as DM.  She has albuterol and advair inhalers.  Her PCP is Dr. Daphane Shepherd.  She is concerned about whooping cough.  She thinks she had a tetanus shot in the last few years but she is not sure.  Called [facility], where she has been treated in the past to inquire about her most recent Td or Tdap, but could not find this information.   Patient Active Problem List  Diagnosis  . Diabetes mellitus, type 2  . Chest pain  . HTN (hypertension)  . Asthma  . GERD (gastroesophageal reflux disease)  . Anemia    Past Medical History  Diagnosis Date  . Chest pain     LEFT SIDED-SEVERAL YEARS  . Diabetes mellitus, type 2   . HTN (hypertension)   . Asthma   . GERD (gastroesophageal reflux disease)   . Anemia     Past Surgical History  Procedure Date  . Endometrial biopsy     History  Substance Use Topics  . Smoking status: Never Smoker   . Smokeless tobacco: Not on file  . Alcohol Use: No    Family History  Problem Relation Age of Onset  . Hypertension Father   . Glaucoma Father   . Hypertension Mother   . Diabetes type II Mother   . Multiple sclerosis Sister     No Known Allergies  Medication list has been reviewed and updated.  Current Outpatient Prescriptions on File Prior to Visit  Medication Sig Dispense Refill  . Albuterol (VENTOLIN IN) Inhale into the lungs  as needed.        Marland Kitchen amLODipine (NORVASC) 10 MG tablet Take 10 mg by mouth daily.        . benazepril-hydrochlorthiazide (LOTENSIN HCT) 20-12.5 MG per tablet Take 1 tablet by mouth daily.        . cetirizine (ZYRTEC) 10 MG tablet Take 10 mg by mouth daily.        . fluticasone (VERAMYST) 27.5 MCG/SPRAY nasal spray Place 2 sprays into the nose daily.      . Fluticasone-Salmeterol (ADVAIR DISKUS) 250-50 MCG/DOSE AEPB Inhale 1 puff into the lungs 2 (two) times daily.       Marland Kitchen glimepiride (AMARYL) 4 MG tablet Take 4 mg by mouth 2 (two) times daily.       . Multiple Vitamins-Iron (DAILY MULTIVITAMINS/IRON PO) Take by mouth daily.        . sitaGLIPtin (JANUVIA) 100 MG tablet Take 100 mg by mouth daily.        . chlorpheniramine (CHLOR-TRIMETON) 4 MG tablet Take 4 mg by mouth.        . ferrous sulfate 325 (65 FE) MG tablet Take 325 mg by mouth daily.        Marland Kitchen  gabapentin (NEURONTIN) 300 MG capsule Take 300 mg by mouth as needed.        . meloxicam (MOBIC) 7.5 MG tablet Take 1 tablet (7.5 mg total) by mouth daily.  14 tablet  0  . metoprolol tartrate (LOPRESSOR) 25 MG tablet Take 1 tablet (25 mg total) by mouth 2 (two) times daily.  180 tablet  3    Review of Systems:  As per HPI- otherwise negative.   Physical Examination: Filed Vitals:   03/21/12 1130  BP: 124/76  Pulse: 85  Temp: 98.3 F (36.8 C)  Resp: 18   Filed Vitals:   03/21/12 1130  Height: 5\' 3"  (1.6 m)  Weight: 227 lb 6.4 oz (103.148 kg)   Body mass index is 40.28 kg/(m^2). Ideal Body Weight: Weight in (lb) to have BMI = 25: 140.8   GEN: WDWN, NAD, Non-toxic, A & O x 3, obese HEENT: Atraumatic, Normocephalic. Neck supple. No masses, No LAD.  Tm and oropharynx wnl.   Ears and Nose: No external deformity. CV: RRR, No M/G/R. No JVD. No thrill. No extra heart sounds. PULM: CTA B, no wheezes, crackles, rhonchi. No retractions. No resp. distress. No accessory muscle use. EXTR: No c/c/e NEURO Normal gait.  PSYCH: Normally  interactive. Conversant. Not depressed or anxious appearing.  Calm demeanor.    Assessment and Plan: 1. Cough  azithromycin (ZITHROMAX) 250 MG tablet  2. RAD (reactive airway disease)  fluticasone (VERAMYST) 27.5 MCG/SPRAY nasal spray  suspect bronchitis.  Will cover with azithromycin, which also treats pertussis.  Encouraged her to find out when she had her last tetanus shot.  If she did not have have a Tdap she should get this once well.   Refilled her veramyst.  She has albuterol at home.  Patient (or parent if minor) instructed to return to clinic or call if not better in 2-3 day(s). Sooner if worse.     Abbe Amsterdam, MD

## 2012-04-05 ENCOUNTER — Telehealth: Payer: Self-pay

## 2012-04-05 NOTE — Telephone Encounter (Signed)
Pt states she was feeling better but now she has started coughing again and she is concerned, please contact pt @ [phone]

## 2012-04-06 NOTE — Telephone Encounter (Signed)
Called patient she was here 15 days ago, concerned she is still coughing, she needs to return to clinic for recheck. I have left mssg for her to call me back.

## 2012-04-07 NOTE — Telephone Encounter (Signed)
I spoke to patient and advised she needs to follow up, she was given Dr Patsy Lager hours and will come back in to see her

## 2012-05-24 ENCOUNTER — Ambulatory Visit (INDEPENDENT_AMBULATORY_CARE_PROVIDER_SITE_OTHER): Payer: BC Managed Care – PPO | Admitting: Family Medicine

## 2012-05-24 ENCOUNTER — Other Ambulatory Visit: Payer: Self-pay | Admitting: Family Medicine

## 2012-05-24 ENCOUNTER — Ambulatory Visit: Payer: BC Managed Care – PPO

## 2012-05-24 ENCOUNTER — Telehealth: Payer: Self-pay

## 2012-05-24 VITALS — BP 156/88 | HR 100 | Temp 98.0°F | Resp 16 | Ht 63.0 in | Wt 223.0 lb

## 2012-05-24 DIAGNOSIS — R062 Wheezing: Secondary | ICD-10-CM

## 2012-05-24 DIAGNOSIS — R059 Cough, unspecified: Secondary | ICD-10-CM

## 2012-05-24 DIAGNOSIS — R05 Cough: Secondary | ICD-10-CM

## 2012-05-24 DIAGNOSIS — J309 Allergic rhinitis, unspecified: Secondary | ICD-10-CM

## 2012-05-24 LAB — POCT CBC
Granulocyte percent: 63.9 %G (ref 37–80)
HCT, POC: 40.8 % (ref 37.7–47.9)
Hemoglobin: 12.1 g/dL — AB (ref 12.2–16.2)
Lymph, poc: 2.7 (ref 0.6–3.4)
MCH, POC: 24.8 pg — AB (ref 27–31.2)
MCHC: 29.7 g/dL — AB (ref 31.8–35.4)
MCV: 83.7 fL (ref 80–97)
MID (cbc): 0.9 (ref 0–0.9)
MPV: 10.3 fL (ref 0–99.8)
POC Granulocyte: 6.3 (ref 2–6.9)
POC LYMPH PERCENT: 27.3 %L (ref 10–50)
POC MID %: 8.8 %M (ref 0–12)
Platelet Count, POC: 367 10*3/uL (ref 142–424)
RBC: 4.87 M/uL (ref 4.04–5.48)
RDW, POC: 14.8 %
WBC: 9.9 10*3/uL (ref 4.6–10.2)

## 2012-05-24 MED ORDER — IPRATROPIUM BROMIDE 0.03 % NA SOLN: 2.0000 | Freq: Two times a day (BID) | NASAL | Status: DC

## 2012-05-24 MED ORDER — ALBUTEROL SULFATE (2.5 MG/3ML) 0.083% IN NEBU
2.5000 mg | INHALATION_SOLUTION | Freq: Once | RESPIRATORY_TRACT | Status: AC
  Administered 2012-05-24: 2.5 mg via RESPIRATORY_TRACT

## 2012-05-24 MED ORDER — AZITHROMYCIN 250 MG PO TABS: ORAL_TABLET | ORAL | Status: DC

## 2012-05-24 MED ORDER — AMOXICILLIN 875 MG PO TABS: 875.0000 mg | ORAL_TABLET | Freq: Two times a day (BID) | ORAL | Status: DC

## 2012-05-24 MED ORDER — HYDROCODONE-HOMATROPINE 5-1.5 MG/5ML PO SYRP: 5.0000 mL | ORAL_SOLUTION | Freq: Three times a day (TID) | ORAL | Status: DC | PRN

## 2012-05-24 NOTE — Telephone Encounter (Signed)
PT HAD THE PHARMACY CALL ABOUT A REFILL AND IT WAS DENIED, SHE WOULD LIKE TO SPEAK WITH THE PERSON THAT DENIED HER REFILL REQUEST AND DIDN'T WANT TO GET OFF THE PHONE. PLEASE CALL [phone]

## 2012-05-24 NOTE — Telephone Encounter (Signed)
Spoke with patient and advised that she would need to be seen for a refill on antibiotics. Patient states understanding and will come in to be seen.

## 2012-05-24 NOTE — Addendum Note (Signed)
Addended by: Jacqualyn Posey on: 05/24/2012 04:37 PM   Modules accepted: Orders

## 2012-05-24 NOTE — Progress Notes (Signed)
  Subjective:    Patient ID: Kristine Bauer, female    DOB: 06/18/1966, 46 y.o.   MRN: [id number]  HPI 46 year old female presents with 1 week history of cough, nasal congestion, and fatigue. She has been taking mucinex and tussin which have not been helping. She does have a history of asthma treated with advair and ventolin prn.  She admits she has been using her ventolin more frequently with this illness.  Denies fever, chills, nausea, vomiting, headache, sinus pain/pressure, or abdominal pain.  She complains of tightness in her chest with subjective wheeze.      Review of Systems  All other systems reviewed and are negative.       Objective:   Physical Exam  Constitutional: She is oriented to person, place, and time. She appears well-developed and well-nourished.  HENT:  Head: Normocephalic and atraumatic.  Right Ear: External ear normal.  Left Ear: External ear normal.  Mouth/Throat: No oropharyngeal exudate.  Eyes: Conjunctivae normal are normal.  Neck: Normal range of motion.  Cardiovascular: Normal rate, regular rhythm and normal heart sounds.   Pulmonary/Chest: Effort normal and breath sounds normal.  Lymphadenopathy:    She has no cervical adenopathy.  Neurological: She is alert and oriented to person, place, and time.  Psychiatric: She has a normal mood and affect. Her behavior is normal. Judgment and thought content normal.    Results for orders placed in visit on 05/24/12  POCT CBC      Component Value Range   WBC 9.9  4.6 - 10.2 K/uL   Lymph, poc 2.7  0.6 - 3.4   POC LYMPH PERCENT 27.3  10 - 50 %L   MID (cbc) 0.9  0 - 0.9   POC MID % 8.8  0 - 12 %M   POC Granulocyte 6.3  2 - 6.9   Granulocyte percent 63.9  37 - 80 %G   RBC 4.87  4.04 - 5.48 M/uL   Hemoglobin 12.1 (*) 12.2 - 16.2 g/dL   HCT, POC 40.9  81.1 - 47.9 %   MCV 83.7  80 - 97 fL   MCH, POC 24.8 (*) 27 - 31.2 pg   MCHC 29.7 (*) 31.8 - 35.4 g/dL   RDW, POC 91.4     Platelet Count, POC 367  142 - 424  K/uL   MPV 10.3  0 - 99.8 fL   [facility] reading (PRIMARY) by  Dr. Milus Glazier as normal chest x-ray with slight hyperinflation.        Assessment & Plan:   1. Cough  Take hycodan as needed for cough Continue albuterol prn and advair bid as directed DG Chest 2 View, POCT CBC, azithromycin (ZITHROMAX) 250 MG tablet, HYDROcodone-homatropine (HYCODAN) 5-1.5 MG/5ML syrup  2. Wheezing  albuterol (PROVENTIL) (2.5 MG/3ML) 0.083% nebulizer solution 2.5 mg  3. Allergic rhinitis Add atrovent nasal spray  ipratropium (ATROVENT) 0.03 % nasal spray   Will cover for bronchitis with Z-pack.  Patient is a diabetic and has not had any follow up for quite some time so will hold on prednisone.  Follow up if symptoms worsen or fail to improve.

## 2012-06-07 ENCOUNTER — Other Ambulatory Visit: Payer: Self-pay | Admitting: Family Medicine

## 2012-06-07 DIAGNOSIS — Z1231 Encounter for screening mammogram for malignant neoplasm of breast: Secondary | ICD-10-CM

## 2012-06-24 ENCOUNTER — Ambulatory Visit: Payer: BC Managed Care – PPO

## 2012-06-24 ENCOUNTER — Ambulatory Visit (INDEPENDENT_AMBULATORY_CARE_PROVIDER_SITE_OTHER): Payer: BC Managed Care – PPO | Admitting: Family Medicine

## 2012-06-24 VITALS — BP 122/68 | HR 89 | Temp 98.2°F | Resp 18 | Ht 63.0 in | Wt 228.0 lb

## 2012-06-24 DIAGNOSIS — R079 Chest pain, unspecified: Secondary | ICD-10-CM

## 2012-06-24 DIAGNOSIS — E119 Type 2 diabetes mellitus without complications: Secondary | ICD-10-CM

## 2012-06-24 DIAGNOSIS — Z23 Encounter for immunization: Secondary | ICD-10-CM

## 2012-06-24 DIAGNOSIS — M545 Low back pain, unspecified: Secondary | ICD-10-CM

## 2012-06-24 DIAGNOSIS — G4700 Insomnia, unspecified: Secondary | ICD-10-CM

## 2012-06-24 DIAGNOSIS — R350 Frequency of micturition: Secondary | ICD-10-CM

## 2012-06-24 DIAGNOSIS — K219 Gastro-esophageal reflux disease without esophagitis: Secondary | ICD-10-CM

## 2012-06-24 LAB — POCT CBC
Lymph, poc: 2.8 (ref 0.6–3.4)
MCH, POC: 24.7 pg — AB (ref 27–31.2)
MCHC: 29.5 g/dL — AB (ref 31.8–35.4)
MCV: 83.9 fL (ref 80–97)
MID (cbc): 0.8 (ref 0–0.9)
POC MID %: 8 %M (ref 0–12)
Platelet Count, POC: 356 10*3/uL (ref 142–424)
RBC: 4.82 M/uL (ref 4.04–5.48)
WBC: 9.6 10*3/uL (ref 4.6–10.2)

## 2012-06-24 LAB — POCT UA - MICROSCOPIC ONLY
Casts, Ur, LPF, POC: NEGATIVE
Crystals, Ur, HPF, POC: NEGATIVE
Mucus, UA: POSITIVE
WBC, Ur, HPF, POC: NEGATIVE
Yeast, UA: NEGATIVE

## 2012-06-24 LAB — POCT URINALYSIS DIPSTICK
Bilirubin, UA: NEGATIVE
Blood, UA: NEGATIVE
Glucose, UA: 100
Ketones, UA: NEGATIVE
Leukocytes, UA: NEGATIVE
Nitrite, UA: NEGATIVE
Protein, UA: NEGATIVE
Spec Grav, UA: 1.025
Urobilinogen, UA: 0.2
pH, UA: 7

## 2012-06-24 LAB — GLUCOSE, POCT (MANUAL RESULT ENTRY): POC Glucose: 219 mg/dl — AB (ref 70–99)

## 2012-06-24 MED ORDER — TRAZODONE HCL 50 MG PO TABS: 25.0000 mg | ORAL_TABLET | Freq: Every evening | ORAL | Status: DC | PRN

## 2012-06-24 MED ORDER — CYCLOBENZAPRINE HCL 5 MG PO TABS: 5.0000 mg | ORAL_TABLET | Freq: Three times a day (TID) | ORAL | Status: DC | PRN

## 2012-06-24 MED ORDER — RANITIDINE HCL 150 MG PO TABS: 150.0000 mg | ORAL_TABLET | Freq: Two times a day (BID) | ORAL | Status: DC

## 2012-06-24 NOTE — Progress Notes (Signed)
[address]   [place], [place]  [address]   [phone]  Subjective:    Patient ID: Kristine Bauer, female    DOB: [birth year], 46 y.o.   MRN: [id number]  HPIThis 46 y.o. female presents for evaluation of low back pain, UTI.  Onset  Of severe lower back pain two days ago. No injury or overuse/excessive activity that could have caused back pain.   Difficulty walking.  Located diffusely lower back.  Intermittent radiation into legs.  No n/t/w.  Normal b/b function.  Increased urination in past two days.  No saddle paresthesias. No history of DDD or back issues.  Severity 10/10.  No dysuria; mild incontinence with coughing which is new in past two days.  No hematuria.  Nocturia x normal.  No fever/chills/sweats.  No nausea or vomiting.  Trying to get out of bed or chair makes worse.  No heavy lifting or overuse.  Desk work.  Has taken Ibuprofen and Neurontin for pain with minimal relief.  Pain is excrutiating getting out of chair or bed.  2. Chest pain:  Onset in past several days; pressure sensation. No associated radiation into jaw or shoulder; no shortness of breath; has suffered with cough in past few weeks. No associated nausea.  No vomiting.   No leg pain or swelling or leg redness; no recent immobilization or surgeries.  +heartburn recently.  Last stress test 2012 per cardiology.  Last visit with cardiology 04/2011.  Severity 9/10; duration of each episode varies 5 minutes - 30 minutes.  Occurred while laying supine for EKG; duration 5 minutes.  Occurs with back pain or stress.  No exertional component that patient has noticed. Very stressful job; sleep schedule is off.  Suffers with insomnia.  3.  DMII: last labs performed in at [facility] in 04/2012.  Does not recall last HgbA1c.  Did not check sugar this morning.  Has not received flu vaccine this fall.   Review of Systems  Constitutional: Negative for fever, chills, diaphoresis and fatigue.  Respiratory: Positive for cough. Negative for  shortness of breath, wheezing and stridor.   Cardiovascular: Positive for chest pain. Negative for palpitations and leg swelling.  Gastrointestinal: Negative for nausea, vomiting, abdominal pain, diarrhea and abdominal distention.  Genitourinary: Positive for frequency. Negative for dysuria, urgency, hematuria and flank pain.  Musculoskeletal: Positive for myalgias and back pain.        Past Medical History  Diagnosis Date  . Chest pain     LEFT SIDED-SEVERAL YEARS  . Diabetes mellitus, type 2   . HTN (hypertension)   . Asthma   . GERD (gastroesophageal reflux disease)   . Anemia     Past Surgical History  Procedure Date  . Endometrial biopsy     Prior to Admission medications   Medication Sig Start Date End Date Taking? Authorizing Provider  Albuterol (VENTOLIN IN) Inhale into the lungs as needed.     Yes Historical Provider, MD  amLODipine (NORVASC) 10 MG tablet Take 10 mg by mouth daily.     Yes Historical Provider, MD  aspirin 81 MG tablet Take 81 mg by mouth daily.   Yes Historical Provider, MD  benazepril-hydrochlorthiazide (LOTENSIN HCT) 20-12.5 MG per tablet Take 1 tablet by mouth daily.     Yes Historical Provider, MD  cetirizine (ZYRTEC) 10 MG tablet Take 10 mg by mouth daily.     Yes Historical Provider, MD  fluticasone (VERAMYST) 27.5 MCG/SPRAY nasal spray Place 2 sprays into the nose  daily. 03/21/12  Yes Gwenlyn Found Copland, MD  Fluticasone-Salmeterol (ADVAIR DISKUS) 250-50 MCG/DOSE AEPB Inhale 1 puff into the lungs 2 (two) times daily.    Yes Historical Provider, MD  gabapentin (NEURONTIN) 300 MG capsule Take 300 mg by mouth as needed.     Yes Historical Provider, MD  glimepiride (AMARYL) 4 MG tablet Take 4 mg by mouth 2 (two) times daily.    Yes Historical Provider, MD  ibuprofen (ADVIL,MOTRIN) 200 MG tablet Take 200 mg by mouth every 6 (six) hours as needed.   Yes Historical Provider, MD  Multiple Vitamins-Iron (DAILY MULTIVITAMINS/IRON PO) Take by mouth daily.      Yes Historical Provider, MD  sitaGLIPtin (JANUVIA) 100 MG tablet Take 100 mg by mouth daily.     Yes Historical Provider, MD  amoxicillin (AMOXIL) 875 MG tablet Take 1 tablet (875 mg total) by mouth 2 (two) times daily. 05/24/12   Heather Jaquita Rector, PA-C  chlorpheniramine (CHLOR-TRIMETON) 4 MG tablet Take 4 mg by mouth.      Historical Provider, MD  dextromethorphan-guaiFENesin (MUCINEX DM) 30-600 MG per 12 hr tablet Take 1 tablet by mouth every 12 (twelve) hours.    Historical Provider, MD  ferrous sulfate 325 (65 FE) MG tablet Take 325 mg by mouth daily.      Historical Provider, MD  HYDROcodone-homatropine (HYCODAN) 5-1.5 MG/5ML syrup Take 5 mLs by mouth every 8 (eight) hours as needed for cough. 05/24/12   Heather Jaquita Rector, PA-C  ipratropium (ATROVENT) 0.03 % nasal spray Place 2 sprays into the nose 2 (two) times daily. 05/24/12   Heather Jaquita Rector, PA-C  meloxicam (MOBIC) 7.5 MG tablet Take 1 tablet (7.5 mg total) by mouth daily. 09/11/11 09/17/12  [name], MD  metoprolol tartrate (LOPRESSOR) 25 MG tablet Take 1 tablet (25 mg total) by mouth 2 (two) times daily. 03/30/11 03/29/12  Vesta Mixer, MD    No Known Allergies  History   Social History  . Marital Status: Single    Spouse Name: N/A    Number of Children: N/A  . Years of Education: N/A   Occupational History  . Not on file.   Social History Main Topics  . Smoking status: Never Smoker   . Smokeless tobacco: Not on file  . Alcohol Use: No  . Drug Use: Not on file  . Sexually Active: Not on file   Other Topics Concern  . Not on file   Social History Narrative  . No narrative on file    Family History  Problem Relation Age of Onset  . Hypertension Father   . Glaucoma Father   . Hypertension Mother   . Diabetes type II Mother   . Multiple sclerosis Sister     Objective:   Physical Exam  Nursing note and vitals reviewed. Constitutional: She is oriented to person, place, and time. She appears well-developed and  well-nourished. No distress.  HENT:  Head: Normocephalic and atraumatic.  Eyes: Conjunctivae normal are normal. Pupils are equal, round, and reactive to light.  Neck: Normal range of motion. Neck supple.  Cardiovascular: Normal rate, regular rhythm, normal heart sounds and intact distal pulses.   No murmur heard. Pulmonary/Chest: Effort normal and breath sounds normal. No respiratory distress. She has no wheezes. She has no rales. She exhibits no tenderness.  Abdominal: Soft. Bowel sounds are normal. She exhibits no distension and no mass. There is no tenderness. There is no rebound and no guarding.  Musculoskeletal:  Right shoulder: She exhibits normal range of motion and no tenderness.       Left shoulder: She exhibits normal range of motion and no tenderness.       Lumbar back: She exhibits decreased range of motion and pain. She exhibits no tenderness, no bony tenderness, no swelling, no edema, no deformity, no spasm and normal pulse.       LUMBAR SPINE:  DECREASED ROM ALL DIRECTIONS DUE TO PAIN; STRAIGHT LEG RAISES EQUIVOCAL; TOE AND HEEL WALKING INTACT; MARCHING INTACT; MOTOR 5/5 BLE.   CHEST: NON-TENDER TO PALPATION.   Neurological: She is alert and oriented to person, place, and time. No cranial nerve deficit or sensory deficit. She exhibits normal muscle tone. Coordination normal.  Skin: Skin is warm and dry. No rash noted. She is not diaphoretic.  Psychiatric: She has a normal mood and affect. Her behavior is normal. Judgment and thought content normal.    [facility] reading (PRIMARY) by  Dr. Katrinka Blazing.  CXR: NAD; poor inspiration.   LS spine films:  Narrowing L1-2, L2-3; mild spurring; no acute.  EKG:  NSR; no acute changes.  Results for orders placed in visit on 06/24/12  POCT URINALYSIS DIPSTICK      Component Value Range   Color, UA yellow     Clarity, UA clear     Glucose, UA 100     Bilirubin, UA neg     Ketones, UA neg     Spec Grav, UA 1.025     Blood, UA neg     pH,  UA 7.0     Protein, UA neg     Urobilinogen, UA 0.2     Nitrite, UA neg     Leukocytes, UA Negative    POCT UA - MICROSCOPIC ONLY      Component Value Range   WBC, Ur, HPF, POC neg     RBC, urine, microscopic 0-1     Bacteria, U Microscopic 1+     Mucus, UA pos     Epithelial cells, urine per micros 2-3     Crystals, Ur, HPF, POC neg     Casts, Ur, LPF, POC neg     Yeast, UA neg    POCT CBC      Component Value Range   WBC 9.6  4.6 - 10.2 K/uL   Lymph, poc 2.8  0.6 - 3.4   POC LYMPH PERCENT 29.3  10 - 50 %L   MID (cbc) 0.8  0 - 0.9   POC MID % 8.0  0 - 12 %M   POC Granulocyte 6.0  2 - 6.9   Granulocyte percent 62.7  37 - 80 %G   RBC 4.82  4.04 - 5.48 M/uL   Hemoglobin 11.9 (*) 12.2 - 16.2 g/dL   HCT, POC 96.0  45.4 - 47.9 %   MCV 83.9  80 - 97 fL   MCH, POC 24.7 (*) 27 - 31.2 pg   MCHC 29.5 (*) 31.8 - 35.4 g/dL   RDW, POC 09.8     Platelet Count, POC 356  142 - 424 K/uL   MPV 10.3  0 - 99.8 fL  GLUCOSE, POCT (MANUAL RESULT ENTRY)      Component Value Range   POC Glucose 219 (*) 70 - 99 mg/dl    INFLUENZA VACCINE ADMINISTERED.     Assessment & Plan:   1. Urine frequency  POCT urinalysis dipstick, POCT UA - Microscopic Only, Urine culture  2. Back pain, lumbosacral  POCT CBC, Comprehensive metabolic panel, DG Lumbar Spine Complete  3. Chest pain  DG Chest 2 View, EKG 12-Lead  4. Type II or unspecified type diabetes mellitus without mention of complication, not stated as uncontrolled  POCT glucose (manual entry)  5. Flu vaccine need  Flu vaccine greater than or equal to 3yo preservative free IM     1. Urinary Frequency: New.  Send urine culture.  Separate issue from back pain.    2.  Lumbosacral pain/strain: New.  Consistent with musculoskeletal etiology and not urological process.  Treat with rest, frequent ambulation, stretches.  Rx provided for Flexeril 5mg  qhs; continue Advil/Ibuprofen or Naproxen tid scheduled for next week.  Home exercises provided to perform  daily. Call office in 2-3 weeks if no improvement for ortho referral. 3.  Chest pain, atypical: New to this provider. Per cardiology notes, chronic left sided chest pain.  Atypical chest pain; no exertional component at this time.  Occurs with back pain and with stress/anxiety; also suffering with some GERD symptoms; rx for Ranitidine 150mg  bid provided to start.  CXR normal; EKG stable. If persists, recommend cardiology follow-up. If acutely worsens, to ED.   4.  DMII: stable; recommend monitoring sugars closely. 5.  S/p flu vaccine. 6.  GERD: worsening; rx for Ranitidine provided. 7. Insomnia: worsening/uncontrolled; rx for Trazodone 50mg  one po qhs provided.   Meds ordered this encounter  Medications  . cyclobenzaprine (FLEXERIL) 5 MG tablet    Sig: Take 1 tablet (5 mg total) by mouth 3 (three) times daily as needed for muscle spasms.    Dispense:  30 tablet    Refill:  1  . traZODone (DESYREL) 50 MG tablet    Sig: Take 0.5-1 tablets (25-50 mg total) by mouth at bedtime as needed for sleep.    Dispense:  30 tablet    Refill:  3  . ranitidine (ZANTAC) 150 MG tablet    Sig: Take 1 tablet (150 mg total) by mouth 2 (two) times daily.    Dispense:  60 tablet    Refill:  3

## 2012-06-24 NOTE — Patient Instructions (Addendum)
1. Urine frequency  POCT urinalysis dipstick, POCT UA - Microscopic Only, Urine culture  2. Back pain, lumbosacral  POCT CBC, Comprehensive metabolic panel, DG Lumbar Spine Complete, cyclobenzaprine (FLEXERIL) 5 MG tablet  3. Chest pain  DG Chest 2 View, EKG 12-Lead, ranitidine (ZANTAC) 150 MG tablet  4. Type II or unspecified type diabetes mellitus without mention of complication, not stated as uncontrolled  POCT glucose (manual entry)  5. Flu vaccine need  Flu vaccine greater than or equal to 3yo preservative free IM  6. Insomnia  traZODone (DESYREL) 50 MG tablet   Low Back Sprain with Rehab  A sprain is an injury in which a ligament is torn. The ligaments of the lower back are vulnerable to sprains. However, they are strong and require great force to be injured. These ligaments are important for stabilizing the spinal column. Sprains are classified into three categories. Grade 1 sprains cause pain, but the tendon is not lengthened. Grade 2 sprains include a lengthened ligament, due to the ligament being stretched or partially ruptured. With grade 2 sprains there is still function, although the function may be decreased. Grade 3 sprains involve a complete tear of the tendon or muscle, and function is usually impaired. SYMPTOMS   Severe pain in the lower back.  Sometimes, a feeling of a "pop," "snap," or tear, at the time of injury.  Tenderness and sometimes swelling at the injury site.  Uncommonly, bruising (contusion) within 48 hours of injury.  Muscle spasms in the back. CAUSES  Low back sprains occur when a force is placed on the ligaments that is greater than they can handle. Common causes of injury include:  Performing a stressful act while off-balance.  Repetitive stressful activities that involve movement of the lower back.  Direct hit (trauma) to the lower back. RISK INCREASES WITH:  Contact sports (football, wrestling).  Collisions (major skiing accidents).  Sports that  require throwing or lifting (baseball, weightlifting).  Sports involving twisting of the spine (gymnastics, diving, tennis, golf).  Poor strength and flexibility.  Inadequate protection.  Previous back injury or surgery (especially fusion). PREVENTION  Wear properly fitted and padded protective equipment.  Warm up and stretch properly before activity.  Allow for adequate recovery between workouts.  Maintain physical fitness:  Strength, flexibility, and endurance.  Cardiovascular fitness.  Maintain a healthy body weight. PROGNOSIS  If treated properly, low back sprains usually heal with non-surgical treatment. The length of time for healing depends on the severity of the injury.  RELATED COMPLICATIONS   Recurring symptoms, resulting in a chronic problem.  Chronic inflammation and pain in the low back.  Delayed healing or resolution of symptoms, especially if activity is resumed too soon.  Prolonged impairment.  Unstable or arthritic joints of the low back. TREATMENT  Treatment first involves the use of ice and medicine, to reduce pain and inflammation. The use of strengthening and stretching exercises may help reduce pain with activity. These exercises may be performed at home or with a therapist. Severe injuries may require referral to a therapist for further evaluation and treatment, such as ultrasound. Your caregiver may advise that you wear a back brace or corset, to help reduce pain and discomfort. Often, prolonged bed rest results in greater harm then benefit. Corticosteroid injections may be recommended. However, these should be reserved for the most serious cases. It is important to avoid using your back when lifting objects. At night, sleep on your back on a firm mattress, with a pillow placed  under your knees. If non-surgical treatment is unsuccessful, surgery may be needed.  MEDICATION   If pain medicine is needed, nonsteroidal anti-inflammatory medicines (aspirin  and ibuprofen), or other minor pain relievers (acetaminophen), are often advised.  Do not take pain medicine for 7 days before surgery.  Prescription pain relievers may be given, if your caregiver thinks they are needed. Use only as directed and only as much as you need.  Ointments applied to the skin may be helpful.  Corticosteroid injections may be given by your caregiver. These injections should be reserved for the most serious cases, because they may only be given a certain number of times. HEAT AND COLD  Cold treatment (icing) should be applied for 10 to 15 minutes every 2 to 3 hours for inflammation and pain, and immediately after activity that aggravates your symptoms. Use ice packs or an ice massage.  Heat treatment may be used before performing stretching and strengthening activities prescribed by your caregiver, physical therapist, or athletic trainer. Use a heat pack or a warm water soak. SEEK MEDICAL CARE IF:   Symptoms get worse or do not improve in 2 to 4 weeks, despite treatment.  You develop numbness or weakness in either leg.  You lose bowel or bladder function.  Any of the following occur after surgery: fever, increased pain, swelling, redness, drainage of fluids, or bleeding in the affected area.  New, unexplained symptoms develop. (Drugs used in treatment may produce side effects.) EXERCISES  RANGE OF MOTION (ROM) AND STRETCHING EXERCISES - Low Back Sprain Most people with lower back pain will find that their symptoms get worse with excessive bending forward (flexion) or arching at the lower back (extension). The exercises that will help resolve your symptoms will focus on the opposite motion.  Your physician, physical therapist or athletic trainer will help you determine which exercises will be most helpful to resolve your lower back pain. Do not complete any exercises without first consulting with your caregiver. Discontinue any exercises which make your symptoms  worse, until you speak to your caregiver. If you have pain, numbness or tingling which travels down into your buttocks, leg or foot, the goal of the therapy is for these symptoms to move closer to your back and eventually resolve. Sometimes, these leg symptoms will get better, but your lower back pain may worsen. This is often an indication of progress in your rehabilitation. Be very alert to any changes in your symptoms and the activities in which you participated in the 24 hours prior to the change. Sharing this information with your caregiver will allow him or her to most efficiently treat your condition. These exercises may help you when beginning to rehabilitate your injury. Your symptoms may resolve with or without further involvement from your physician, physical therapist or athletic trainer. While completing these exercises, remember:   Restoring tissue flexibility helps normal motion to return to the joints. This allows healthier, less painful movement and activity.  An effective stretch should be held for at least 30 seconds.  A stretch should never be painful. You should only feel a gentle lengthening or release in the stretched tissue. FLEXION RANGE OF MOTION AND STRETCHING EXERCISES: STRETCH  Flexion, Single Knee to Chest   Lie on a firm bed or floor with both legs extended in front of you.  Keeping one leg in contact with the floor, bring your opposite knee to your chest. Hold your leg in place by either grabbing behind your thigh or at your  knee.  Pull until you feel a gentle stretch in your low back. Hold __________ seconds.  Slowly release your grasp and repeat the exercise with the opposite side. Repeat __________ times. Complete this exercise __________ times per day.  STRETCH  Flexion, Double Knee to Chest  Lie on a firm bed or floor with both legs extended in front of you.  Keeping one leg in contact with the floor, bring your opposite knee to your chest.  Tense your  stomach muscles to support your back and then lift your other knee to your chest. Hold your legs in place by either grabbing behind your thighs or at your knees.  Pull both knees toward your chest until you feel a gentle stretch in your low back. Hold __________ seconds.  Tense your stomach muscles and slowly return one leg at a time to the floor. Repeat __________ times. Complete this exercise __________ times per day.  STRETCH  Low Trunk Rotation  Lie on a firm bed or floor. Keeping your legs in front of you, bend your knees so they are both pointed toward the ceiling and your feet are flat on the floor.  Extend your arms out to the side. This will stabilize your upper body by keeping your shoulders in contact with the floor.  Gently and slowly drop both knees together to one side until you feel a gentle stretch in your low back. Hold for __________ seconds.  Tense your stomach muscles to support your lower back as you bring your knees back to the starting position. Repeat the exercise to the other side. Repeat __________ times. Complete this exercise __________ times per day  EXTENSION RANGE OF MOTION AND FLEXIBILITY EXERCISES: STRETCH  Extension, Prone on Elbows   Lie on your stomach on the floor, a bed will be too soft. Place your palms about shoulder width apart and at the height of your head.  Place your elbows under your shoulders. If this is too painful, stack pillows under your chest.  Allow your body to relax so that your hips drop lower and make contact more completely with the floor.  Hold this position for __________ seconds.  Slowly return to lying flat on the floor. Repeat __________ times. Complete this exercise __________ times per day.  RANGE OF MOTION  Extension, Prone Press Ups  Lie on your stomach on the floor, a bed will be too soft. Place your palms about shoulder width apart and at the height of your head.  Keeping your back as relaxed as possible, slowly  straighten your elbows while keeping your hips on the floor. You may adjust the placement of your hands to maximize your comfort. As you gain motion, your hands will come more underneath your shoulders.  Hold this position __________ seconds.  Slowly return to lying flat on the floor. Repeat __________ times. Complete this exercise __________ times per day.  RANGE OF MOTION- Quadruped, Neutral Spine   Assume a hands and knees position on a firm surface. Keep your hands under your shoulders and your knees under your hips. You may place padding under your knees for comfort.  Drop your head and point your tailbone toward the ground below you. This will round out your lower back like an angry cat. Hold this position for __________ seconds.  Slowly lift your head and release your tail bone so that your back sags into a large arch, like an old horse.  Hold this position for __________ seconds.  Repeat this until  you feel limber in your low back.  Now, find your "sweet spot." This will be the most comfortable position somewhere between the two previous positions. This is your neutral spine. Once you have found this position, tense your stomach muscles to support your low back.  Hold this position for __________ seconds. Repeat __________ times. Complete this exercise __________ times per day.  STRENGTHENING EXERCISES - Low Back Sprain These exercises may help you when beginning to rehabilitate your injury. These exercises should be done near your "sweet spot." This is the neutral, low-back arch, somewhere between fully rounded and fully arched, that is your least painful position. When performed in this safe range of motion, these exercises can be used for people who have either a flexion or extension based injury. These exercises may resolve your symptoms with or without further involvement from your physician, physical therapist or athletic trainer. While completing these exercises, remember:    Muscles can gain both the endurance and the strength needed for everyday activities through controlled exercises.  Complete these exercises as instructed by your physician, physical therapist or athletic trainer. Increase the resistance and repetitions only as guided.  You may experience muscle soreness or fatigue, but the pain or discomfort you are trying to eliminate should never worsen during these exercises. If this pain does worsen, stop and make certain you are following the directions exactly. If the pain is still present after adjustments, discontinue the exercise until you can discuss the trouble with your caregiver. STRENGTHENING Deep Abdominals, Pelvic Tilt   Lie on a firm bed or floor. Keeping your legs in front of you, bend your knees so they are both pointed toward the ceiling and your feet are flat on the floor.  Tense your lower abdominal muscles to press your low back into the floor. This motion will rotate your pelvis so that your tail bone is scooping upwards rather than pointing at your feet or into the floor. With a gentle tension and even breathing, hold this position for __________ seconds. Repeat __________ times. Complete this exercise __________ times per day.  STRENGTHENING  Abdominals, Crunches   Lie on a firm bed or floor. Keeping your legs in front of you, bend your knees so they are both pointed toward the ceiling and your feet are flat on the floor. Cross your arms over your chest.  Slightly tip your chin down without bending your neck.  Tense your abdominals and slowly lift your trunk high enough to just clear your shoulder blades. Lifting higher can put excessive stress on the lower back and does not further strengthen your abdominal muscles.  Control your return to the starting position. Repeat __________ times. Complete this exercise __________ times per day.  STRENGTHENING  Quadruped, Opposite UE/LE Lift   Assume a hands and knees position on a firm  surface. Keep your hands under your shoulders and your knees under your hips. You may place padding under your knees for comfort.  Find your neutral spine and gently tense your abdominal muscles so that you can maintain this position. Your shoulders and hips should form a rectangle that is parallel with the floor and is not twisted.  Keeping your trunk steady, lift your right hand no higher than your shoulder and then your left leg no higher than your hip. Make sure you are not holding your breath. Hold this position for __________ seconds.  Continuing to keep your abdominal muscles tense and your back steady, slowly return to your starting position.  Repeat with the opposite arm and leg. Repeat __________ times. Complete this exercise __________ times per day.  STRENGTHENING  Abdominals and Quadriceps, Straight Leg Raise   Lie on a firm bed or floor with both legs extended in front of you.  Keeping one leg in contact with the floor, bend the other knee so that your foot can rest flat on the floor.  Find your neutral spine, and tense your abdominal muscles to maintain your spinal position throughout the exercise.  Slowly lift your straight leg off the floor about 6 inches for a count of 15, making sure to not hold your breath.  Still keeping your neutral spine, slowly lower your leg all the way to the floor. Repeat this exercise with each leg __________ times. Complete this exercise __________ times per day. POSTURE AND BODY MECHANICS CONSIDERATIONS - Low Back Sprain Keeping correct posture when sitting, standing or completing your activities will reduce the stress put on different body tissues, allowing injured tissues a chance to heal and limiting painful experiences. The following are general guidelines for improved posture. Your physician or physical therapist will provide you with any instructions specific to your needs. While reading these guidelines, remember:  The exercises prescribed  by your provider will help you have the flexibility and strength to maintain correct postures.  The correct posture provides the best environment for your joints to work. All of your joints have less wear and tear when properly supported by a spine with good posture. This means you will experience a healthier, less painful body.  Correct posture must be practiced with all of your activities, especially prolonged sitting and standing. Correct posture is as important when doing repetitive low-stress activities (typing) as it is when doing a single heavy-load activity (lifting). RESTING POSITIONS Consider which positions are most painful for you when choosing a resting position. If you have pain with flexion-based activities (sitting, bending, stooping, squatting), choose a position that allows you to rest in a less flexed posture. You would want to avoid curling into a fetal position on your side. If your pain worsens with extension-based activities (prolonged standing, working overhead), avoid resting in an extended position such as sleeping on your stomach. Most people will find more comfort when they rest with their spine in a more neutral position, neither too rounded nor too arched. Lying on a non-sagging bed on your side with a pillow between your knees, or on your back with a pillow under your knees will often provide some relief. Keep in mind, being in any one position for a prolonged period of time, no matter how correct your posture, can still lead to stiffness. PROPER SITTING POSTURE In order to minimize stress and discomfort on your spine, you must sit with correct posture. Sitting with good posture should be effortless for a healthy body. Returning to good posture is a gradual process. Many people can work toward this most comfortably by using various supports until they have the flexibility and strength to maintain this posture on their own. When sitting with proper posture, your ears will fall  over your shoulders and your shoulders will fall over your hips. You should use the back of the chair to support your upper back. Your lower back will be in a neutral position, just slightly arched. You may place a small pillow or folded towel at the base of your lower back for  support.  When working at a desk, create an environment that supports good, upright posture. Without  extra support, muscles tire, which leads to excessive strain on joints and other tissues. Keep these recommendations in mind: CHAIR:  A chair should be able to slide under your desk when your back makes contact with the back of the chair. This allows you to work closely.  The chair's height should allow your eyes to be level with the upper part of your monitor and your hands to be slightly lower than your elbows. BODY POSITION  Your feet should make contact with the floor. If this is not possible, use a foot rest.  Keep your ears over your shoulders. This will reduce stress on your neck and low back. INCORRECT SITTING POSTURES  If you are feeling tired and unable to assume a healthy sitting posture, do not slouch or slump. This puts excessive strain on your back tissues, causing more damage and pain. Healthier options include:  Using more support, like a lumbar pillow.  Switching tasks to something that requires you to be upright or walking.  Talking a brief walk.  Lying down to rest in a neutral-spine position. PROLONGED STANDING WHILE SLIGHTLY LEANING FORWARD  When completing a task that requires you to lean forward while standing in one place for a long time, place either foot up on a stationary 2-4 inch high object to help maintain the best posture. When both feet are on the ground, the lower back tends to lose its slight inward curve. If this curve flattens (or becomes too large), then the back and your other joints will experience too much stress, tire more quickly, and can cause pain. CORRECT STANDING  POSTURES Proper standing posture should be assumed with all daily activities, even if they only take a few moments, like when brushing your teeth. As in sitting, your ears should fall over your shoulders and your shoulders should fall over your hips. You should keep a slight tension in your abdominal muscles to brace your spine. Your tailbone should point down to the ground, not behind your body, resulting in an over-extended swayback posture.  INCORRECT STANDING POSTURES  Common incorrect standing postures include a forward head, locked knees and/or an excessive swayback. WALKING Walk with an upright posture. Your ears, shoulders and hips should all line-up. PROLONGED ACTIVITY IN A FLEXED POSITION When completing a task that requires you to bend forward at your waist or lean over a low surface, try to find a way to stabilize 3 out of 4 of your limbs. You can place a hand or elbow on your thigh or rest a knee on the surface you are reaching across. This will provide you more stability, so that your muscles do not tire as quickly. By keeping your knees relaxed, or slightly bent, you will also reduce stress across your lower back. CORRECT LIFTING TECHNIQUES DO :  Assume a wide stance. This will provide you more stability and the opportunity to get as close as possible to the object which you are lifting.  Tense your abdominals to brace your spine. Bend at the knees and hips. Keeping your back locked in a neutral-spine position, lift using your leg muscles. Lift with your legs, keeping your back straight.  Test the weight of unknown objects before attempting to lift them.  Try to keep your elbows locked down at your sides in order get the best strength from your shoulders when carrying an object.  Always ask for help when lifting heavy or awkward objects. INCORRECT LIFTING TECHNIQUES DO NOT:   Lock your knees when  lifting, even if it is a small object.  Bend and twist. Pivot at your feet or  move your feet when needing to change directions.  Assume that you can safely pick up even a paperclip without proper posture. Document Released: 08/24/2005 Document Revised: 11/16/2011 Document Reviewed: 12/06/2008 [facility] Patient Information 2013 [place], [place].

## 2012-06-25 LAB — COMPREHENSIVE METABOLIC PANEL
AST: 18 U/L (ref 0–37)
BUN: 16 mg/dL (ref 6–23)
CO2: 22 mEq/L (ref 19–32)
Calcium: 9.6 mg/dL (ref 8.4–10.5)
Chloride: 102 mEq/L (ref 96–112)
Creat: 0.82 mg/dL (ref 0.50–1.10)
Total Bilirubin: 0.3 mg/dL (ref 0.3–1.2)

## 2012-06-26 LAB — URINE CULTURE: Colony Count: 30000

## 2012-07-04 ENCOUNTER — Ambulatory Visit (HOSPITAL_COMMUNITY)
Admission: RE | Admit: 2012-07-04 | Discharge: 2012-07-04 | Disposition: A | Discharge status: Home / Self Care | Payer: BC Managed Care – PPO | Source: Ambulatory Visit | Attending: Family Medicine | Admitting: Family Medicine

## 2012-07-04 DIAGNOSIS — Z1231 Encounter for screening mammogram for malignant neoplasm of breast: Secondary | ICD-10-CM | POA: Insufficient documentation

## 2012-08-01 ENCOUNTER — Ambulatory Visit (INDEPENDENT_AMBULATORY_CARE_PROVIDER_SITE_OTHER): Payer: BC Managed Care – PPO | Admitting: Physician Assistant

## 2012-08-01 VITALS — BP 139/78 | HR 103 | Temp 98.9°F | Resp 18 | Ht 62.75 in | Wt 226.4 lb

## 2012-08-01 DIAGNOSIS — I10 Essential (primary) hypertension: Secondary | ICD-10-CM

## 2012-08-01 DIAGNOSIS — J309 Allergic rhinitis, unspecified: Secondary | ICD-10-CM

## 2012-08-01 DIAGNOSIS — K219 Gastro-esophageal reflux disease without esophagitis: Secondary | ICD-10-CM

## 2012-08-01 DIAGNOSIS — E119 Type 2 diabetes mellitus without complications: Secondary | ICD-10-CM

## 2012-08-01 LAB — POCT GLYCOSYLATED HEMOGLOBIN (HGB A1C): Hemoglobin A1C: 8.5

## 2012-08-01 MED ORDER — SITAGLIPTIN PHOSPHATE 100 MG PO TABS: 100.0000 mg | ORAL_TABLET | Freq: Every day | ORAL | Status: DC

## 2012-08-01 MED ORDER — FLUTICASONE PROPIONATE 50 MCG/ACT NA SUSP: 2.0000 | Freq: Every day | NASAL | Status: DC

## 2012-08-01 MED ORDER — HYDROCHLOROTHIAZIDE 12.5 MG PO TABS: 12.5000 mg | ORAL_TABLET | Freq: Every day | ORAL | Status: DC

## 2012-08-01 MED ORDER — RANITIDINE HCL 150 MG PO TABS: 150.0000 mg | ORAL_TABLET | Freq: Two times a day (BID) | ORAL | Status: DC

## 2012-08-01 MED ORDER — GLIMEPIRIDE 4 MG PO TABS: 4.0000 mg | ORAL_TABLET | Freq: Every day | ORAL | Status: DC

## 2012-08-01 MED ORDER — BENAZEPRIL HCL 20 MG PO TABS: 20.0000 mg | ORAL_TABLET | Freq: Every day | ORAL | Status: DC

## 2012-08-01 MED ORDER — AMLODIPINE BESYLATE 10 MG PO TABS: 10.0000 mg | ORAL_TABLET | Freq: Every day | ORAL | Status: DC

## 2012-08-01 NOTE — Progress Notes (Signed)
  Subjective:    Patient ID: Kristine Bauer, female    DOB: [birth year], 46 y.o.   MRN: [id number]  HPI 46 year old female presents for medication refills.  She is a previous patient of [facility] and has come here to establish care with Dr. Clelia Croft. Has recently lost her job and so her insurance will be ending this week.  She is needing refills on her hypertension and diabetes medications.  Plans to find a new job and insurance within the next 6 months if possible.     Review of Systems  Respiratory: Negative for chest tightness, shortness of breath and wheezing.   Genitourinary: Negative for frequency.  All other systems reviewed and are negative.       Objective:   Physical Exam  Constitutional: She is oriented to person, place, and time. She appears well-developed and well-nourished.  HENT:  Head: Normocephalic and atraumatic.  Right Ear: External ear normal.  Left Ear: External ear normal.  Eyes: Conjunctivae normal are normal.  Neck: Normal range of motion.  Cardiovascular: Normal rate, regular rhythm and normal heart sounds.   Pulmonary/Chest: Effort normal and breath sounds normal.  Neurological: She is alert and oriented to person, place, and time.  Psychiatric: She has a normal mood and affect. Her behavior is normal. Judgment and thought content normal.          Assessment & Plan:   1. Diabetes mellitus  POCT glycosylated hemoglobin (Hb A1C), glimepiride (AMARYL) 4 MG tablet, sitaGLIPtin (JANUVIA) 100 MG tablet  2. Hypertension  benazepril (LOTENSIN) 20 MG tablet, hydrochlorothiazide (HYDRODIURIL) 12.5 MG tablet, amLODipine (NORVASC) 10 MG tablet  3. Allergic rhinitis  fluticasone (FLONASE) 50 MCG/ACT nasal spray  4. GERD (gastroesophageal reflux disease)  ranitidine (ZANTAC) 150 MG tablet  Diabetes - still uncontrolled.  I have refilled 6 months of her chronic medications Try coupon card for Januvia, will consider an alternative if too expensive Follow up in 6 months  for labwork and recheck due to financial restrictions.

## 2012-08-02 ENCOUNTER — Other Ambulatory Visit: Payer: Self-pay | Admitting: Physician Assistant

## 2012-08-02 DIAGNOSIS — R079 Chest pain, unspecified: Secondary | ICD-10-CM

## 2012-08-02 MED ORDER — RANITIDINE HCL 150 MG PO TABS: 150.0000 mg | ORAL_TABLET | Freq: Two times a day (BID) | ORAL | Status: DC

## 2012-08-05 ENCOUNTER — Telehealth: Payer: Self-pay

## 2012-08-05 DIAGNOSIS — I10 Essential (primary) hypertension: Secondary | ICD-10-CM

## 2012-08-05 DIAGNOSIS — E119 Type 2 diabetes mellitus without complications: Secondary | ICD-10-CM

## 2012-08-05 MED ORDER — SITAGLIPTIN PHOSPHATE 100 MG PO TABS: 100.0000 mg | ORAL_TABLET | Freq: Every day | ORAL | Status: DC

## 2012-08-05 MED ORDER — AMLODIPINE BESYLATE 10 MG PO TABS: 10.0000 mg | ORAL_TABLET | Freq: Every day | ORAL | Status: DC

## 2012-08-05 MED ORDER — BENAZEPRIL-HYDROCHLOROTHIAZIDE 20-12.5 MG PO TABS: 1.0000 | ORAL_TABLET | Freq: Every day | ORAL | Status: DC

## 2012-08-05 MED ORDER — GLIMEPIRIDE 4 MG PO TABS: 4.0000 mg | ORAL_TABLET | Freq: Two times a day (BID) | ORAL | Status: DC

## 2012-08-05 NOTE — Telephone Encounter (Signed)
PT WOULD LIKE FOR HEATHER TO CALL HER JANUVIA IN TO [facility] FOR A 90-DAY SUPPLY.  [phone]

## 2012-08-05 NOTE — Telephone Encounter (Signed)
Done and sent to pharmacy. 

## 2012-08-05 NOTE — Telephone Encounter (Signed)
Pt called back again and she now also wants her amlodipine changed to 90 day supply at [facility]. I have changed this Rx to [facility]. Herbert Seta, please see the last message in this phone encounter for Rxs that need to be changed by you.

## 2012-08-05 NOTE — Telephone Encounter (Signed)
Pt called back and asks Heather to change the following:  1. Pt now wants the Glimepiride Rx to be sent to [facility] for #180 w/1 RF  2. Pt had asked Heather to separate her benazepril and HCTZ, but now she would like to combine them after all and send 90 day supply to [facility] - should be benazepril - HCTZ 20 - 12.5 mg.

## 2012-08-05 NOTE — Telephone Encounter (Signed)
Notified pt on VM that all four of the medications we discussed have been sent to [facility].

## 2012-08-05 NOTE — Telephone Encounter (Signed)
Spoke w/pt to verify that the only Rx she needs to be sent to [facility] is Januvia. Pt agreed only this Rx. I have changed this Rx to [facility] for #90 w/1 RF.  Heather, pt reports that she takes two 4 mg tabs QD of her glimepiride, so she needs #60/mos and the sig to be changed to take two tabs daily. Can we change this? The glimepiride Rx needs to stay at [facility].

## 2013-01-19 ENCOUNTER — Telehealth: Payer: Self-pay

## 2013-01-19 MED ORDER — GLIMEPIRIDE 4 MG PO TABS: 4.0000 mg | ORAL_TABLET | Freq: Two times a day (BID) | ORAL | Status: DC

## 2013-01-19 NOTE — Telephone Encounter (Signed)
Patient is requesting an increase from 1 tablet to 2 tablets on a medication. Could not make out the name of the medication she stated. [phone]

## 2013-01-19 NOTE — Telephone Encounter (Signed)
Which medication? She is asking for amaryl bid, it is written bid, but she states when she picked up last time pharmacy indicated once daily. Resent to [employer] bid dosing.

## 2013-02-14 ENCOUNTER — Other Ambulatory Visit: Payer: Self-pay | Admitting: Physician Assistant

## 2013-03-29 ENCOUNTER — Other Ambulatory Visit: Payer: Self-pay | Admitting: Physician Assistant

## 2013-03-29 NOTE — Telephone Encounter (Signed)
Needs office visit.

## 2013-05-18 ENCOUNTER — Other Ambulatory Visit: Payer: Self-pay | Admitting: Physician Assistant

## 2013-06-22 ENCOUNTER — Other Ambulatory Visit: Payer: Self-pay | Admitting: Family Medicine

## 2013-06-23 ENCOUNTER — Other Ambulatory Visit: Payer: Self-pay

## 2013-06-23 DIAGNOSIS — E119 Type 2 diabetes mellitus without complications: Secondary | ICD-10-CM

## 2013-06-23 MED ORDER — BENAZEPRIL-HYDROCHLOROTHIAZIDE 20-12.5 MG PO TABS: 1.0000 | ORAL_TABLET | Freq: Every day | ORAL | Status: DC

## 2013-06-23 MED ORDER — BENAZEPRIL HCL 20 MG PO TABS: 20.0000 mg | ORAL_TABLET | Freq: Every day | ORAL | Status: DC

## 2013-06-23 MED ORDER — GLIMEPIRIDE 4 MG PO TABS: 4.0000 mg | ORAL_TABLET | Freq: Every day | ORAL | Status: DC

## 2013-06-23 MED ORDER — HYDROCHLOROTHIAZIDE 12.5 MG PO CAPS: ORAL_CAPSULE | ORAL | Status: DC

## 2013-06-23 MED ORDER — AMLODIPINE BESYLATE 10 MG PO TABS: 10.0000 mg | ORAL_TABLET | Freq: Every day | ORAL | Status: DC

## 2013-06-23 NOTE — Telephone Encounter (Signed)
Notified pt re: RFs and also need to come in BEFORE running out of meds pt agreed. Also advised pt to check all Rxs when she p/up to make sure that they are for the whole month because a couple of them were just sent for 2 week supplies. Pt understood.

## 2013-06-23 NOTE — Telephone Encounter (Signed)
Patient is requesting to have all these medications to a 30day supply not 15 day supply due to being expensive to keep going back every 2 weeks.: naproxen (NAPROSYN) 500 MG tablet,  , amLODipine (NORVASC) 10 MG tablet, gabapentin (NEURONTIN) 300 MG capsule and HYDROcodone-homatropine (HYCODAN) 5-1.5 MG/5ML syrup  Patient uses: [address] - [place], [place] - [address]   Best number: [phone]

## 2013-06-23 NOTE — Telephone Encounter (Signed)
Yes ok to send in a month supply to give pt a window to get into clinic - she needs to come in for evaluation while she is still on the medication BEFORE running out.

## 2013-06-23 NOTE — Telephone Encounter (Signed)
She needs office visit, if she comes in she can get larger supply.  Patient advised she can come in next month, can we send in a one month supply for her, she is very overdue for follow up, but she has just started new job.  Disregard med list below, these are not the ones she needs, pended what she needs.

## 2013-07-21 ENCOUNTER — Other Ambulatory Visit: Payer: Self-pay | Admitting: Family Medicine

## 2014-02-11 ENCOUNTER — Encounter (HOSPITAL_COMMUNITY): Payer: Self-pay | Admitting: Emergency Medicine

## 2014-02-11 ENCOUNTER — Emergency Department (HOSPITAL_COMMUNITY)
Admission: EM | Admit: 2014-02-11 | Discharge: 2014-02-11 | Disposition: A | Discharge status: Home / Self Care | Payer: BC Managed Care – PPO | Attending: Emergency Medicine | Admitting: Emergency Medicine

## 2014-02-11 ENCOUNTER — Emergency Department (HOSPITAL_COMMUNITY): Payer: BC Managed Care – PPO

## 2014-02-11 DIAGNOSIS — E119 Type 2 diabetes mellitus without complications: Secondary | ICD-10-CM | POA: Insufficient documentation

## 2014-02-11 DIAGNOSIS — I10 Essential (primary) hypertension: Secondary | ICD-10-CM | POA: Insufficient documentation

## 2014-02-11 DIAGNOSIS — Z792 Long term (current) use of antibiotics: Secondary | ICD-10-CM | POA: Insufficient documentation

## 2014-02-11 DIAGNOSIS — Z7982 Long term (current) use of aspirin: Secondary | ICD-10-CM | POA: Insufficient documentation

## 2014-02-11 DIAGNOSIS — J45909 Unspecified asthma, uncomplicated: Secondary | ICD-10-CM | POA: Insufficient documentation

## 2014-02-11 DIAGNOSIS — Z79899 Other long term (current) drug therapy: Secondary | ICD-10-CM | POA: Insufficient documentation

## 2014-02-11 DIAGNOSIS — H9209 Otalgia, unspecified ear: Secondary | ICD-10-CM | POA: Insufficient documentation

## 2014-02-11 DIAGNOSIS — D649 Anemia, unspecified: Secondary | ICD-10-CM | POA: Insufficient documentation

## 2014-02-11 DIAGNOSIS — K219 Gastro-esophageal reflux disease without esophagitis: Secondary | ICD-10-CM | POA: Insufficient documentation

## 2014-02-11 DIAGNOSIS — J069 Acute upper respiratory infection, unspecified: Secondary | ICD-10-CM | POA: Insufficient documentation

## 2014-02-11 DIAGNOSIS — R0789 Other chest pain: Secondary | ICD-10-CM | POA: Insufficient documentation

## 2014-02-11 DIAGNOSIS — R739 Hyperglycemia, unspecified: Secondary | ICD-10-CM

## 2014-02-11 DIAGNOSIS — Z791 Long term (current) use of non-steroidal anti-inflammatories (NSAID): Secondary | ICD-10-CM | POA: Insufficient documentation

## 2014-02-11 DIAGNOSIS — H9201 Otalgia, right ear: Secondary | ICD-10-CM

## 2014-02-11 DIAGNOSIS — IMO0002 Reserved for concepts with insufficient information to code with codable children: Secondary | ICD-10-CM | POA: Insufficient documentation

## 2014-02-11 LAB — COMPREHENSIVE METABOLIC PANEL
ALT: 29 U/L (ref 0–35)
AST: 22 U/L (ref 0–37)
Albumin: 3.4 g/dL — ABNORMAL LOW (ref 3.5–5.2)
Alkaline Phosphatase: 91 U/L (ref 39–117)
BUN: 11 mg/dL (ref 6–23)
CHLORIDE: 95 meq/L — AB (ref 96–112)
CO2: 24 meq/L (ref 19–32)
CREATININE: 0.45 mg/dL — AB (ref 0.50–1.10)
Calcium: 9.1 mg/dL (ref 8.4–10.5)
GLUCOSE: 345 mg/dL — AB (ref 70–99)
Potassium: 3.8 mEq/L (ref 3.7–5.3)
Sodium: 132 mEq/L — ABNORMAL LOW (ref 137–147)
Total Bilirubin: 0.3 mg/dL (ref 0.3–1.2)
Total Protein: 7.6 g/dL (ref 6.0–8.3)

## 2014-02-11 LAB — CBG MONITORING, ED
GLUCOSE-CAPILLARY: 255 mg/dL — AB (ref 70–99)
Glucose-Capillary: 360 mg/dL — ABNORMAL HIGH (ref 70–99)

## 2014-02-11 LAB — CBC WITH DIFFERENTIAL/PLATELET
Basophils Absolute: 0.1 10*3/uL (ref 0.0–0.1)
Basophils Relative: 1 % (ref 0–1)
Eosinophils Absolute: 0.2 10*3/uL (ref 0.0–0.7)
Eosinophils Relative: 2 % (ref 0–5)
HCT: 37.7 % (ref 36.0–46.0)
HEMOGLOBIN: 12.2 g/dL (ref 12.0–15.0)
LYMPHS PCT: 26 % (ref 12–46)
Lymphs Abs: 2.3 10*3/uL (ref 0.7–4.0)
MCH: 26.6 pg (ref 26.0–34.0)
MCHC: 32.4 g/dL (ref 30.0–36.0)
MCV: 82.3 fL (ref 78.0–100.0)
Monocytes Absolute: 0.8 10*3/uL (ref 0.1–1.0)
Monocytes Relative: 9 % (ref 3–12)
NEUTROS ABS: 5.4 10*3/uL (ref 1.7–7.7)
Neutrophils Relative %: 62 % (ref 43–77)
Platelets: 343 10*3/uL (ref 150–400)
RBC: 4.58 MIL/uL (ref 3.87–5.11)
RDW: 13.6 % (ref 11.5–15.5)
WBC: 8.7 10*3/uL (ref 4.0–10.5)

## 2014-02-11 LAB — TROPONIN I

## 2014-02-11 MED ORDER — AMLODIPINE BESYLATE 10 MG PO TABS: 10.0000 mg | ORAL_TABLET | Freq: Every day | ORAL | Status: DC

## 2014-02-11 MED ORDER — BENAZEPRIL HCL 20 MG PO TABS: 20.0000 mg | ORAL_TABLET | Freq: Every day | ORAL | Status: DC

## 2014-02-11 MED ORDER — BENZONATATE 100 MG PO CAPS: 100.0000 mg | ORAL_CAPSULE | Freq: Three times a day (TID) | ORAL | Status: DC | PRN

## 2014-02-11 MED ORDER — SODIUM CHLORIDE 0.9 % IV BOLUS (SEPSIS)
1000.0000 mL | INTRAVENOUS | Status: AC
  Administered 2014-02-11: 1000 mL via INTRAVENOUS

## 2014-02-11 MED ORDER — HYDROCHLOROTHIAZIDE 25 MG PO TABS: 25.0000 mg | ORAL_TABLET | Freq: Every day | ORAL | Status: DC

## 2014-02-11 MED ORDER — GLIMEPIRIDE 4 MG PO TABS: 4.0000 mg | ORAL_TABLET | Freq: Every day | ORAL | Status: DC

## 2014-02-11 NOTE — ED Notes (Signed)
Pt presents to ed with multiple complaints, she sts she ahs been having cold like symptoms for a few weeks, she also has some chest pain with it. Pt sts her extremities are feeling numb and thinks it's from her diabetes (cbg 360 in triage). She sts didn't take her diabetes meds in a while.

## 2014-02-11 NOTE — Discharge Instructions (Signed)
Chest Pain (Nonspecific) °Chest pain has many causes. Your pain could be caused by something serious, such as a heart attack or a blood clot in the lungs. It could also be caused by something less serious, such as a chest bruise or a virus. Follow up with your doctor. More lab tests or other studies may be needed to find the cause of your pain. Most of the time, nonspecific chest pain will improve within 2 to 3 days of rest and mild pain medicine. °HOME CARE °· For chest bruises, you may put ice on the sore area for 15-20 minutes, 03-04 times a day. Do this only if it makes you feel better. °· Put ice in a plastic bag. °· Place a towel between the skin and the bag. °· Rest for the next 2 to 3 days. °· Go back to work if the pain improves. °· See your doctor if the pain lasts longer than 1 to 2 weeks. °· Only take medicine as told by your doctor. °· Quit smoking if you smoke. °GET HELP RIGHT AWAY IF:  °· There is more pain or pain that spreads to the arm, neck, jaw, back, or belly (abdomen). °· You have shortness of breath. °· You cough more than usual or cough up blood. °· You have very bad back or belly pain, feel sick to your stomach (nauseous), or throw up (vomit). °· You have very bad weakness. °· You pass out (faint). °· You have a fever. °Any of these problems may be serious and may be an emergency. Do not wait to see if the problems will go away. Get medical help right away. Call your local emergency services 911 in [place] Do not drive yourself to the hospital. °MAKE SURE YOU:  °· Understand these instructions. °· Will watch this condition. °· Will get help right away if you or your child is not doing well or gets worse. °Document Released: 02/10/2008 Document Revised: 11/16/2011 Document Reviewed: 02/10/2008 °ExitCare® Patient Information ©2014 [employer]. ° °

## 2014-02-11 NOTE — ED Provider Notes (Signed)
CSN: [id number]     Arrival date & time 02/11/14  0753 History   First MD Initiated Contact with Patient 02/11/14 [phone]     Chief Complaint  Patient presents with  . URI  . Chest Pain  . Diabetes     (Consider location/radiation/quality/duration/timing/severity/associated sxs/prior Treatment) Patient is a 48 y.o. female presenting with URI, chest pain, and diabetes problem. The history is provided by the patient.  URI Presenting symptoms: congestion, cough, ear pain (right ear), rhinorrhea and sore throat   Presenting symptoms: no fatigue and no fever   Congestion:    Location:  Nasal Cough:    Cough characteristics:  Productive   Sputum characteristics:  White   Severity:  Mild   Onset quality:  Gradual   Duration:  3 days   Timing:  Constant   Progression:  Worsening   Chronicity:  New Ear pain:    Location:  Right   Severity:  Mild   Onset quality:  Gradual   Duration:  3 days   Progression:  Unchanged   Chronicity:  New Severity:  Mild Associated symptoms: no headaches and no neck pain   Chest Pain Associated symptoms: cough and vomiting (post tussive)   Associated symptoms: no abdominal pain, no back pain, no dizziness, no fatigue, no fever, no headache, no nausea and no shortness of breath   Diabetes Associated symptoms include chest pain. Pertinent negatives include no abdominal pain, no headaches and no shortness of breath.    Past Medical History  Diagnosis Date  . Chest pain     LEFT SIDED-SEVERAL YEARS  . Diabetes mellitus, type 2   . HTN (hypertension)   . Asthma   . GERD (gastroesophageal reflux disease)   . Anemia    Past Surgical History  Procedure Laterality Date  . Endometrial biopsy     Family History  Problem Relation Age of Onset  . Hypertension Father   . Glaucoma Father   . Hypertension Mother   . Diabetes type II Mother   . Multiple sclerosis Sister    History  Substance Use Topics  . Smoking status: Never Smoker   . Smokeless  tobacco: Not on file  . Alcohol Use: No   OB History   Grav Para Term Preterm Abortions TAB SAB Ect Mult Living                 Review of Systems  Constitutional: Negative for fever and fatigue.  HENT: Positive for congestion, ear pain (right ear), rhinorrhea and sore throat. Negative for drooling.   Eyes: Negative for pain.  Respiratory: Positive for cough. Negative for shortness of breath.   Cardiovascular: Positive for chest pain.  Gastrointestinal: Positive for vomiting (post tussive). Negative for nausea, abdominal pain and diarrhea.  Genitourinary: Negative for dysuria and hematuria.  Musculoskeletal: Negative for back pain, gait problem and neck pain.  Skin: Negative for color change.  Neurological: Negative for dizziness and headaches.  Hematological: Negative for adenopathy.  Psychiatric/Behavioral: Negative for behavioral problems.  All other systems reviewed and are negative.     Allergies  Review of patient's allergies indicates no known allergies.  Home Medications   Prior to Admission medications   Medication Sig Start Date End Date Taking? Authorizing Provider  amLODipine (NORVASC) 10 MG tablet Take 10 mg by mouth daily.   Yes Historical Provider, MD  aspirin EC 81 MG tablet Take 81 mg by mouth daily.   Yes Historical Provider, MD  benazepril (LOTENSIN) 20  MG tablet Take 20 mg by mouth daily.   Yes Historical Provider, MD  cetirizine (ZYRTEC) 10 MG tablet Take 10 mg by mouth daily.     Yes Historical Provider, MD  dextromethorphan-guaiFENesin (TUSSIN DM) 10-100 MG/5ML liquid Take 10 mLs by mouth every 4 (four) hours as needed for cough.   Yes Historical Provider, MD  fluticasone (FLONASE) 50 MCG/ACT nasal spray Place 1 spray into both nostrils daily.   Yes Historical Provider, MD  glimepiride (AMARYL) 4 MG tablet Take 1 tablet (4 mg total) by mouth daily before breakfast. 06/23/13  Yes Shawnee Knapp, MD  hydrochlorothiazide (HYDRODIURIL) 25 MG tablet Take 12.5 mg  by mouth daily.   Yes Historical Provider, MD  Multiple Vitamins-Iron (DAILY MULTIVITAMINS/IRON PO) Take 1 tablet by mouth daily.    Yes Historical Provider, MD  oxymetazoline (NASAL DECONGESTANT SPRAY) 0.05 % nasal spray Place 1 spray into both nostrils continuous as needed for congestion.   Yes Historical Provider, MD  Albuterol (VENTOLIN IN) Inhale into the lungs as needed.      Historical Provider, MD  amoxicillin (AMOXIL) 875 MG tablet Take 1 tablet (875 mg total) by mouth 2 (two) times daily. 05/24/12   Heather Elnora Morrison, PA-C  benazepril-hydrochlorthiazide (LOTENSIN HCT) 20-12.5 MG per tablet Take 1 tablet by mouth daily. 06/23/13   Shawnee Knapp, MD  chlorpheniramine (CHLOR-TRIMETON) 4 MG tablet Take 4 mg by mouth.      Historical Provider, MD  cyclobenzaprine (FLEXERIL) 5 MG tablet Take 1 tablet (5 mg total) by mouth 3 (three) times daily as needed for muscle spasms. 06/24/12   Wardell Honour, MD  dextromethorphan-guaiFENesin (MUCINEX DM) 30-600 MG per 12 hr tablet Take 1 tablet by mouth every 12 (twelve) hours.    Historical Provider, MD  ferrous sulfate 325 (65 FE) MG tablet Take 325 mg by mouth daily.      Historical Provider, MD  fluticasone (VERAMYST) 27.5 MCG/SPRAY nasal spray Place 2 sprays into the nose daily. 03/21/12   Gay Filler Copland, MD  Fluticasone-Salmeterol (ADVAIR DISKUS) 250-50 MCG/DOSE AEPB Inhale 1 puff into the lungs 2 (two) times daily.     Historical Provider, MD  gabapentin (NEURONTIN) 300 MG capsule Take 300 mg by mouth as needed.      Historical Provider, MD  ibuprofen (ADVIL,MOTRIN) 200 MG tablet Take 200 mg by mouth every 6 (six) hours as needed.    Historical Provider, MD  ipratropium (ATROVENT) 0.03 % nasal spray Place 2 sprays into the nose 2 (two) times daily. 05/24/12   Heather Elnora Morrison, PA-C  metoprolol tartrate (LOPRESSOR) 25 MG tablet Take 1 tablet (25 mg total) by mouth 2 (two) times daily. 03/30/11 03/29/12  Thayer Headings, MD  naproxen (NAPROSYN) 500 MG tablet  Take 500 mg by mouth once.    Historical Provider, MD  ranitidine (ZANTAC) 150 MG tablet Take 1 tablet (150 mg total) by mouth 2 (two) times daily. 08/02/12   Eleanore Kurtis Bushman, PA-C  sitaGLIPtin (JANUVIA) 100 MG tablet Take 100 mg by mouth daily.      Historical Provider, MD  traZODone (DESYREL) 50 MG tablet Take 1/2 - 1 tablet by mouth at bedtime as needed. PATIENT NEEDS OFFICE VISIT FOR ADDITIONAL REFILLS 07/21/13   Theda Sers, PA-C   BP 132/77  Pulse 98  Temp(Src) 98.5 F (36.9 C) (Oral)  Resp 20  SpO2 99%  LMP 01/31/2014 Physical Exam  Nursing note and vitals reviewed. Constitutional: She is oriented to person, place,  and time. She appears well-developed and well-nourished.  HENT:  Head: Normocephalic and atraumatic.  Right Ear: External ear normal.  Left Ear: External ear normal.  Mouth/Throat: Oropharynx is clear and moist. No oropharyngeal exudate.  TM's clear bilaterally.   Eyes: Conjunctivae and EOM are normal. Pupils are equal, round, and reactive to light.  Neck: Normal range of motion. Neck supple.  Cardiovascular: Normal rate, regular rhythm, normal heart sounds and intact distal pulses.  Exam reveals no gallop and no friction rub.   No murmur heard. Pulmonary/Chest: Effort normal and breath sounds normal. No respiratory distress. She has no wheezes.  Abdominal: Soft. Bowel sounds are normal. There is no tenderness. There is no rebound and no guarding.  Musculoskeletal: Normal range of motion. She exhibits no edema and no tenderness.  Neurological: She is alert and oriented to person, place, and time.  Skin: Skin is warm and dry.  Psychiatric: She has a normal mood and affect. Her behavior is normal.    ED Course  Procedures (including critical care time) Labs Review Labs Reviewed  COMPREHENSIVE METABOLIC PANEL - Abnormal; Notable for the following:    Sodium 132 (*)    Chloride 95 (*)    Glucose, Bld 345 (*)    Creatinine, Ser 0.45 (*)    Albumin 3.4 (*)     All other components within normal limits  CBG MONITORING, ED - Abnormal; Notable for the following:    Glucose-Capillary 360 (*)    All other components within normal limits  CBG MONITORING, ED - Abnormal; Notable for the following:    Glucose-Capillary 255 (*)    All other components within normal limits  CBC WITH DIFFERENTIAL  TROPONIN I    Imaging Review Dg Chest 2 View  02/11/2014   CLINICAL DATA:  Left upper chest pain, nausea, vomiting, 2-3 week history of cough  EXAM: CHEST  2 VIEW  COMPARISON:  Prior chest x-ray 06/24/2012  FINDINGS: The lungs are clear and negative for focal airspace consolidation, pulmonary edema or suspicious pulmonary nodule. Mild bronchial wall thickening. No pleural effusion or pneumothorax. Cardiac and mediastinal contours are within normal limits. No acute fracture or lytic or blastic osseous lesions. The visualized upper abdominal bowel gas pattern is unremarkable.  IMPRESSION: No active cardiopulmonary disease.  Stable central bronchial wall thickening as can be seen in the setting of chronic bronchitis or asthma.   Electronically Signed   By: Jacqulynn Cadet M.D.   On: 02/11/2014 09:18     EKG Interpretation   Date/Time:  Sunday February 11 2014 08:18:27 EDT Ventricular Rate:  97 PR Interval:  164 QRS Duration: 84 QT Interval:  363 QTC Calculation: 461 R Axis:   24 Text Interpretation:  Sinus rhythm Biatrial enlargement Baseline wander in  lead(s) V1 Confirmed by Ason Heslin  MD, Nashawn Hillock ([id number]) on 02/11/2014 8:23:41  AM      MDM   Final diagnoses:  URI (upper respiratory infection)  Right ear pain  Hyperglycemia  Atypical chest pain    8:52 AM 48 y.o. female with a history of diabetes, hypertension who presents with URI symptoms for the last 3 days. She denies any fevers. She does have some posttussive emesis. She also notes that she has chronic intermittent chest pain in the left side of her chest which is aching in nature and lasts only a few  minutes at a time while at rest. She denies any chest pain or shortness of breath currently on exam. She is afebrile and vital  signs are unremarkable here. Will get screening labwork and imaging. Pt also having tingling sensation in distal extremities which is worsening over the last few weeks. She was prescribed [place] but has not been taking it due to financial reasons. BS is 360 here. Will give IVF.   11:46 AM: I interpreted/reviewed the labs and/or imaging which were non-contributory.  BS has dec appropriately w/ IVF. Suspect bronchitis. CP atypical and sounds to be chronic. I have discussed the diagnosis/risks/treatment options with the patient and believe the pt to be eligible for discharge home to follow-up with pcp w/in 1-2 weeks to discuss BP and DM medicines. She asked me to refill her BP and DM Rx's. We also discussed returning to the ED immediately if new or worsening sx occur. We discussed the sx which are most concerning (e.g., sob, worsening cp, fever) that necessitate immediate return. Medications administered to the patient during their visit and any new prescriptions provided to the patient are listed below.  Medications given during this visit Medications  sodium chloride 0.9 % bolus 1,000 mL (1,000 mLs Intravenous New Bag/Given 02/11/14 0910)    New Prescriptions   AMLODIPINE (NORVASC) 10 MG TABLET    Take 1 tablet (10 mg total) by mouth daily.   BENAZEPRIL (LOTENSIN) 20 MG TABLET    Take 1 tablet (20 mg total) by mouth daily.   BENZONATATE (TESSALON) 100 MG CAPSULE    Take 1 capsule (100 mg total) by mouth 3 (three) times daily as needed for cough.   GLIMEPIRIDE (AMARYL) 4 MG TABLET    Take 1 tablet (4 mg total) by mouth daily with breakfast.   HYDROCHLOROTHIAZIDE (HYDRODIURIL) 25 MG TABLET    Take 1 tablet (25 mg total) by mouth daily.     Blanchard Kelch, MD 02/11/14 [phone]

## 2014-04-27 ENCOUNTER — Other Ambulatory Visit (HOSPITAL_COMMUNITY): Payer: Self-pay | Admitting: Obstetrics and Gynecology

## 2014-04-27 DIAGNOSIS — Z1231 Encounter for screening mammogram for malignant neoplasm of breast: Secondary | ICD-10-CM

## 2014-05-02 ENCOUNTER — Ambulatory Visit (HOSPITAL_COMMUNITY)
Admission: RE | Admit: 2014-05-02 | Discharge: 2014-05-02 | Disposition: A | Discharge status: Home / Self Care | Payer: BC Managed Care – PPO | Source: Ambulatory Visit | Attending: Obstetrics and Gynecology | Admitting: Obstetrics and Gynecology

## 2014-05-02 DIAGNOSIS — Z1231 Encounter for screening mammogram for malignant neoplasm of breast: Secondary | ICD-10-CM

## 2014-06-05 ENCOUNTER — Encounter: Payer: Self-pay | Admitting: Cardiology

## 2015-06-12 ENCOUNTER — Other Ambulatory Visit: Payer: Self-pay

## 2015-06-12 DIAGNOSIS — Z1231 Encounter for screening mammogram for malignant neoplasm of breast: Secondary | ICD-10-CM

## 2015-06-13 ENCOUNTER — Other Ambulatory Visit: Payer: Self-pay | Admitting: Family Medicine

## 2015-06-17 ENCOUNTER — Other Ambulatory Visit: Payer: Self-pay | Admitting: Family Medicine

## 2015-06-19 ENCOUNTER — Ambulatory Visit
Admission: RE | Admit: 2015-06-19 | Discharge: 2015-06-19 | Disposition: A | Discharge status: Home / Self Care | Payer: BLUE CROSS/BLUE SHIELD | Source: Ambulatory Visit

## 2015-06-19 DIAGNOSIS — Z1231 Encounter for screening mammogram for malignant neoplasm of breast: Secondary | ICD-10-CM

## 2015-09-23 ENCOUNTER — Emergency Department (HOSPITAL_COMMUNITY)
Admission: EM | Admit: 2015-09-23 | Discharge: 2015-09-23 | Disposition: A | Discharge status: Home / Self Care | Payer: BLUE CROSS/BLUE SHIELD | Attending: Emergency Medicine | Admitting: Emergency Medicine

## 2015-09-23 ENCOUNTER — Encounter (HOSPITAL_COMMUNITY): Payer: Self-pay | Admitting: Emergency Medicine

## 2015-09-23 DIAGNOSIS — Z79899 Other long term (current) drug therapy: Secondary | ICD-10-CM | POA: Diagnosis not present

## 2015-09-23 DIAGNOSIS — Z862 Personal history of diseases of the blood and blood-forming organs and certain disorders involving the immune mechanism: Secondary | ICD-10-CM | POA: Diagnosis not present

## 2015-09-23 DIAGNOSIS — E119 Type 2 diabetes mellitus without complications: Secondary | ICD-10-CM | POA: Diagnosis not present

## 2015-09-23 DIAGNOSIS — J45909 Unspecified asthma, uncomplicated: Secondary | ICD-10-CM | POA: Insufficient documentation

## 2015-09-23 DIAGNOSIS — Z7951 Long term (current) use of inhaled steroids: Secondary | ICD-10-CM | POA: Diagnosis not present

## 2015-09-23 DIAGNOSIS — Z8719 Personal history of other diseases of the digestive system: Secondary | ICD-10-CM | POA: Insufficient documentation

## 2015-09-23 DIAGNOSIS — Z7984 Long term (current) use of oral hypoglycemic drugs: Secondary | ICD-10-CM | POA: Diagnosis not present

## 2015-09-23 DIAGNOSIS — R21 Rash and other nonspecific skin eruption: Secondary | ICD-10-CM | POA: Diagnosis present

## 2015-09-23 DIAGNOSIS — Z7982 Long term (current) use of aspirin: Secondary | ICD-10-CM | POA: Diagnosis not present

## 2015-09-23 DIAGNOSIS — I10 Essential (primary) hypertension: Secondary | ICD-10-CM | POA: Diagnosis not present

## 2015-09-23 MED ORDER — PERMETHRIN 5 % EX CREA: TOPICAL_CREAM | CUTANEOUS | Status: DC

## 2015-09-23 NOTE — ED Provider Notes (Signed)
CSN: [id number]     Arrival date & time 09/23/15  1027 History  By signing my name below, I, Rayna Sexton, attest that this documentation has been prepared under the direction and in the presence of Lacretia Leigh, MD. Electronically Signed: Rayna Sexton, ED Scribe. 09/23/2015. 12:24 PM.    Chief Complaint  Patient presents with  . Rash   The history is provided by the patient. No language interpreter was used.    HPI Comments: Anastassia Noack is a 50 y.o. female who presents to the Emergency Department complaining of a worsening, moderate, diffuse, burning, rash with onset 1.5 months ago. Pt notes being dx with contact dermatitis and has been applying topical triamcinolone acetonide (prescribed by Mervyn Gay at [facility]) and taking 50 mg hydroxy pam with little relief. She notes associated itchiness and mild swelling to the affected regions s/p scratching. Pt notes the rash is currently across her lower back, extremities, neck and face. Pt notes she has had co-workers in the past with similar symptoms and were told it was "carpet mites" further noting her work takes her to many different homes. She denies fever, chills, n/v or any other associated symptoms at this time.    Past Medical History  Diagnosis Date  . Chest pain     LEFT SIDED-SEVERAL YEARS  . Diabetes mellitus, type 2 ([place])   . HTN (hypertension)   . Asthma   . GERD (gastroesophageal reflux disease)   . Anemia    Past Surgical History  Procedure Laterality Date  . Endometrial biopsy     Family History  Problem Relation Age of Onset  . Hypertension Father   . Glaucoma Father   . Hypertension Mother   . Diabetes type II Mother   . Multiple sclerosis Sister    Social History  Substance Use Topics  . Smoking status: Never Smoker   . Smokeless tobacco: None  . Alcohol Use: No   OB History    No data available     Review of Systems  Constitutional: Negative for fever and  chills.  Gastrointestinal: Negative for nausea and vomiting.  Skin: Positive for color change and rash.    Allergies  Review of patient's allergies indicates no known allergies.  Home Medications   Prior to Admission medications   Medication Sig Start Date End Date Taking? Authorizing Provider  Albuterol (VENTOLIN IN) Inhale 2 puffs into the lungs as needed (For wheezing).     Historical Provider, MD  amLODipine (NORVASC) 10 MG tablet Take 10 mg by mouth daily.    Historical Provider, MD  amLODipine (NORVASC) 10 MG tablet Take 1 tablet (10 mg total) by mouth daily. 02/11/14   Pamella Pert, MD  aspirin EC 81 MG tablet Take 81 mg by mouth daily.    Historical Provider, MD  benazepril (LOTENSIN) 20 MG tablet Take 20 mg by mouth daily.    Historical Provider, MD  benazepril (LOTENSIN) 20 MG tablet Take 1 tablet (20 mg total) by mouth daily. 02/11/14   Pamella Pert, MD  benzonatate (TESSALON) 100 MG capsule Take 1 capsule (100 mg total) by mouth 3 (three) times daily as needed for cough. 02/11/14   Pamella Pert, MD  carbamide peroxide (EAR DROPS) 6.5 % otic solution Place 3 drops into the right ear daily as needed (for ear pain).    Historical Provider, MD  cetirizine (ZYRTEC) 10 MG tablet Take 10 mg by mouth daily.      Historical Provider,  MD  cyclobenzaprine (FLEXERIL) 10 MG tablet Take 10 mg by mouth 3 (three) times daily as needed for muscle spasms.    Historical Provider, MD  dextromethorphan-guaiFENesin (TUSSIN DM) 10-100 MG/5ML liquid Take 10 mLs by mouth every 4 (four) hours as needed for cough.    Historical Provider, MD  diphenhydrAMINE (BENADRYL) 25 MG tablet Take 50 mg by mouth every 6 (six) hours as needed for allergies.    Historical Provider, MD  fluticasone (FLONASE) 50 MCG/ACT nasal spray Place 1 spray into both nostrils daily.    Historical Provider, MD  glimepiride (AMARYL) 4 MG tablet Take 1 tablet (4 mg total) by mouth daily before breakfast. 06/23/13   Shawnee Knapp, MD   glimepiride (AMARYL) 4 MG tablet Take 1 tablet (4 mg total) by mouth daily with breakfast. 02/11/14   Pamella Pert, MD  guaiFENesin (MUCINEX) 600 MG 12 hr tablet Take 600 mg by mouth 2 (two) times daily as needed for cough.    Historical Provider, MD  hydrochlorothiazide (HYDRODIURIL) 25 MG tablet Take 12.5 mg by mouth daily.    Historical Provider, MD  hydrochlorothiazide (HYDRODIURIL) 25 MG tablet Take 1 tablet (25 mg total) by mouth daily. 02/11/14   Pamella Pert, MD  ibuprofen (ADVIL,MOTRIN) 200 MG tablet Take 800 mg by mouth every 6 (six) hours as needed for moderate pain.     Historical Provider, MD  miconazole (MONISTAT-3) KIT Place 1 each vaginally at bedtime.    Historical Provider, MD  Multiple Vitamins-Iron (DAILY MULTIVITAMINS/IRON PO) Take 1 tablet by mouth daily.     Historical Provider, MD  naphazoline-pheniramine (VISINE-A) 0.025-0.3 % ophthalmic solution Place 3-4 drops into both eyes 4 (four) times daily as needed for irritation.    Historical Provider, MD  oxymetazoline (NASAL DECONGESTANT SPRAY) 0.05 % nasal spray Place 1 spray into both nostrils continuous as needed for congestion.    Historical Provider, MD  Pseudoephedrine-Acetaminophen (SINUS PO) Take 2 tablets by mouth every 12 (twelve) hours as needed (for cold).    Historical Provider, MD  sitaGLIPtin (JANUVIA) 100 MG tablet Take 100 mg by mouth daily.      Historical Provider, MD   BP 117/78 mmHg  Pulse 94  Temp(Src) 98.8 F (37.1 C) (Oral)  Resp 14  SpO2 100%  LMP 08/08/2015    Physical Exam  Constitutional: She is oriented to person, place, and time. She appears well-developed and well-nourished.  HENT:  Head: Normocephalic.  Mouth/Throat: Oropharynx is clear and moist. No oropharyngeal exudate.  No swelling of the lips, tongue or throat.   Eyes: EOM are normal.  Neck: Normal range of motion.  Cardiovascular: Normal rate, regular rhythm and normal heart sounds.   Pulmonary/Chest: Effort normal and  breath sounds normal.  Abdominal: She exhibits no distension.  Musculoskeletal: Normal range of motion.  Neurological: She is alert and oriented to person, place, and time.  Skin: Rash noted. There is erythema.  Erythematous papules located on both forearms and the posterior neck. No rash present in the webbed spaces of the fingers.   Psychiatric: She has a normal mood and affect.  Nursing note and vitals reviewed.   ED Course  Procedures  DIAGNOSTIC STUDIES: Oxygen Saturation is 100% on RA, normal by my interpretation.    COORDINATION OF CARE: 12:23 PM Pt presents today due to a worsening rash. Discussed treatment plan with pt at bedside including permethrin. Return precautions noted. Pt agreed to plan.  Labs Review Labs Reviewed - No data to display  Imaging Review No results found.   EKG Interpretation None      MDM   Final diagnoses:  None  Patient presents today with a rash.  Rash most consistent with bed bugs.  Patient is afebrile.  No swelling of lips, tongue, or throat.  Lungs CTAB.  Feel that the patient is stable for discharge.  Return precautions given. I personally performed the services described in this documentation, which was scribed in my presence. The recorded information has been reviewed and is accurate.     Hyman Bible, PA-C 09/23/15 1407  Lacretia Leigh, MD 09/24/15 [phone]

## 2015-09-23 NOTE — ED Notes (Signed)
Pt reports ongoing rash post diagnosis of contact dermitis; using triamcinolone acetonide cream for treatment.

## 2016-02-10 ENCOUNTER — Emergency Department (HOSPITAL_COMMUNITY)
Admission: EM | Admit: 2016-02-10 | Discharge: 2016-02-10 | Disposition: A | Discharge status: Home / Self Care | Payer: BLUE CROSS/BLUE SHIELD | Attending: Emergency Medicine | Admitting: Emergency Medicine

## 2016-02-10 ENCOUNTER — Encounter (HOSPITAL_COMMUNITY): Payer: Self-pay | Admitting: Emergency Medicine

## 2016-02-10 ENCOUNTER — Emergency Department (HOSPITAL_COMMUNITY): Payer: BLUE CROSS/BLUE SHIELD

## 2016-02-10 DIAGNOSIS — E119 Type 2 diabetes mellitus without complications: Secondary | ICD-10-CM | POA: Insufficient documentation

## 2016-02-10 DIAGNOSIS — R42 Dizziness and giddiness: Secondary | ICD-10-CM | POA: Diagnosis not present

## 2016-02-10 DIAGNOSIS — I10 Essential (primary) hypertension: Secondary | ICD-10-CM | POA: Insufficient documentation

## 2016-02-10 DIAGNOSIS — Z7982 Long term (current) use of aspirin: Secondary | ICD-10-CM | POA: Diagnosis not present

## 2016-02-10 DIAGNOSIS — J45909 Unspecified asthma, uncomplicated: Secondary | ICD-10-CM | POA: Diagnosis not present

## 2016-02-10 DIAGNOSIS — R0789 Other chest pain: Secondary | ICD-10-CM | POA: Insufficient documentation

## 2016-02-10 DIAGNOSIS — Z7984 Long term (current) use of oral hypoglycemic drugs: Secondary | ICD-10-CM | POA: Insufficient documentation

## 2016-02-10 LAB — BASIC METABOLIC PANEL
ANION GAP: 8 (ref 5–15)
BUN: 14 mg/dL (ref 6–20)
CALCIUM: 9.1 mg/dL (ref 8.9–10.3)
CO2: 26 mmol/L (ref 22–32)
CREATININE: 0.68 mg/dL (ref 0.44–1.00)
Chloride: 101 mmol/L (ref 101–111)
Glucose, Bld: 279 mg/dL — ABNORMAL HIGH (ref 65–99)
Potassium: 3.5 mmol/L (ref 3.5–5.1)
SODIUM: 135 mmol/L (ref 135–145)

## 2016-02-10 LAB — CBC
HCT: 34.4 % — ABNORMAL LOW (ref 36.0–46.0)
HEMOGLOBIN: 11.4 g/dL — AB (ref 12.0–15.0)
MCH: 26 pg (ref 26.0–34.0)
MCHC: 33.1 g/dL (ref 30.0–36.0)
MCV: 78.5 fL (ref 78.0–100.0)
PLATELETS: 306 10*3/uL (ref 150–400)
RBC: 4.38 MIL/uL (ref 3.87–5.11)
RDW: 14.4 % (ref 11.5–15.5)
WBC: 8.2 10*3/uL (ref 4.0–10.5)

## 2016-02-10 LAB — I-STAT TROPONIN, ED: TROPONIN I, POC: 0 ng/mL (ref 0.00–0.08)

## 2016-02-10 MED ORDER — MECLIZINE HCL 25 MG PO TABS
25.0000 mg | ORAL_TABLET | Freq: Once | ORAL | Status: AC
  Administered 2016-02-10: 25 mg via ORAL
  Filled 2016-02-10: qty 1

## 2016-02-10 MED ORDER — DIAZEPAM 5 MG PO TABS: 5.0000 mg | ORAL_TABLET | Freq: Three times a day (TID) | ORAL | Status: DC | PRN

## 2016-02-10 MED ORDER — MECLIZINE HCL 25 MG PO TABS: 25.0000 mg | ORAL_TABLET | Freq: Three times a day (TID) | ORAL | Status: DC | PRN

## 2016-02-10 NOTE — ED Notes (Signed)
Pt states that she has been having central CP with "extreme dizziness" x a week and a half. States that she has been seen at her doctors office and they haven't found anything.  They did an EKG and blood work.  Pt describes a slight pain on the left side of her neck.

## 2016-02-10 NOTE — Discharge Instructions (Signed)
Benign Positional Vertigo Vertigo is the feeling that you or your surroundings are moving when they are not. Benign positional vertigo is the most common form of vertigo. The cause of this condition is not serious (is benign). This condition is triggered by certain movements and positions (is positional). This condition can be dangerous if it occurs while you are doing something that could endanger you or others, such as driving.  CAUSES In many cases, the cause of this condition is not known. It may be caused by a disturbance in an area of the inner ear that helps your brain to sense movement and balance. This disturbance can be caused by a viral infection (labyrinthitis), head injury, or repetitive motion. RISK FACTORS This condition is more likely to develop in:  Women.  People who are 50 years of age or older. SYMPTOMS Symptoms of this condition usually happen when you move your head or your eyes in different directions. Symptoms may start suddenly, and they usually last for less than a minute. Symptoms may include:  Loss of balance and falling.  Feeling like you are spinning or moving.  Feeling like your surroundings are spinning or moving.  Nausea and vomiting.  Blurred vision.  Dizziness.  Involuntary eye movement (nystagmus). Symptoms can be mild and cause only slight annoyance, or they can be severe and interfere with daily life. Episodes of benign positional vertigo may return (recur) over time, and they may be triggered by certain movements. Symptoms may improve over time. DIAGNOSIS This condition is usually diagnosed by medical history and a physical exam of the head, neck, and ears. You may be referred to a health care provider who specializes in ear, nose, and throat (ENT) problems (otolaryngologist) or a provider who specializes in disorders of the nervous system (neurologist). You may have additional testing, including:  MRI.  A CT scan.  Eye movement tests. Your  health care provider may ask you to change positions quickly while he or she watches you for symptoms of benign positional vertigo, such as nystagmus. Eye movement may be tested with an electronystagmogram (ENG), caloric stimulation, the Dix-Hallpike test, or the roll test.  An electroencephalogram (EEG). This records electrical activity in your brain.  Hearing tests. TREATMENT Usually, your health care provider will treat this by moving your head in specific positions to adjust your inner ear back to normal. Surgery may be needed in severe cases, but this is rare. In some cases, benign positional vertigo may resolve on its own in 2-4 weeks. HOME CARE INSTRUCTIONS Safety  Move slowly.Avoid sudden body or head movements.  Avoid driving.  Avoid operating heavy machinery.  Avoid doing any tasks that would be dangerous to you or others if a vertigo episode would occur.  If you have trouble walking or keeping your balance, try using a cane for stability. If you feel dizzy or unstable, sit down right away.  Return to your normal activities as told by your health care provider. Ask your health care provider what activities are safe for you. General Instructions  Take over-the-counter and prescription medicines only as told by your health care provider.  Avoid certain positions or movements as told by your health care provider.  Drink enough fluid to keep your urine clear or pale yellow.  Keep all follow-up visits as told by your health care provider. This is important. SEEK MEDICAL CARE IF:  You have a fever.  Your condition gets worse or you develop new symptoms.  Your family or friends   notice any behavioral changes.  Your nausea or vomiting gets worse.  You have numbness or a "pins and needles" sensation. SEEK IMMEDIATE MEDICAL CARE IF:  You have difficulty speaking or moving.  You are always dizzy.  You faint.  You develop severe headaches.  You have weakness in your  legs or arms.  You have changes in your hearing or vision.  You develop a stiff neck.  You develop sensitivity to light.   This information is not intended to replace advice given to you by your health care provider. Make sure you discuss any questions you have with your health care provider.   Document Released: 06/01/2006 Document Revised: 05/15/2015 Document Reviewed: 12/17/2014 Elsevier Interactive Patient Education 2016 [employer]  

## 2016-02-10 NOTE — ED Provider Notes (Signed)
CSN: [id number]     Arrival date & time 02/10/16  1129 History   First MD Initiated Contact with Patient 02/10/16 1420     Chief Complaint  Patient presents with  . Dizziness  . Chest Pain     (Consider location/radiation/quality/duration/timing/severity/associated sxs/prior Treatment) HPI Patient reports she has been getting episodes of spinning dizziness for about a week and a half. She reports it started suddenly. She woke up one morning and just open her eyes in the room was spinning. She reports since then it has waxed and waned. It seems to be at is worse when she bends over to either pick something up on the floor or do an activity that requires her to bend forward such as making the bed. When she stands up from that position the spinning becomes intense. She reports is also often made worse by lying flat. No associated nausea or vomiting. No associated focal weakness numbness or tingling. She reports some chronic decreased sensation in her fingertips from neuropathy but there is been no change. No visual change. Patient has also noted intermittently that she's had a slight pain on the left side of her posterior neck. She reports it feels kind of like a spasm or a tightness and it is not a pain and it does not radiate into her head nor does she have posterior headache. This discomfort does not correlate with episodes of vertigo. No associated upper extremity numbness or paresthesia aside from baseline as described. Patient also notes she's been getting a focal area of discomfort in her left anterior chest. That is well has come and gone several times. Again this does not correlate with her symptoms. No syncope or near syncope. Patient describes the dizziness as being very much of an intense spinning quality. Past Medical History  Diagnosis Date  . Chest pain     LEFT SIDED-SEVERAL YEARS  . Diabetes mellitus, type 2 ([place])   . HTN (hypertension)   . Asthma   . GERD (gastroesophageal reflux  disease)   . Anemia    Past Surgical History  Procedure Laterality Date  . Endometrial biopsy     Family History  Problem Relation Age of Onset  . Hypertension Father   . Glaucoma Father   . Hypertension Mother   . Diabetes type II Mother   . Multiple sclerosis Sister    Social History  Substance Use Topics  . Smoking status: Never Smoker   . Smokeless tobacco: None  . Alcohol Use: No   OB History    No data available     Review of Systems 10 Systems reviewed and are negative for acute change except as noted in the HPI.   Allergies  Review of patient's allergies indicates no known allergies.  Home Medications   Prior to Admission medications   Medication Sig Start Date End Date Taking? Authorizing Provider  albuterol (PROVENTIL HFA;VENTOLIN HFA) 108 (90 Base) MCG/ACT inhaler Inhale 1-2 puffs into the lungs every 6 (six) hours as needed for wheezing or shortness of breath.   Yes Historical Provider, MD  amLODipine (NORVASC) 10 MG tablet Take 1 tablet (10 mg total) by mouth daily. 02/11/14  Yes Pamella Pert, MD  aspirin EC 81 MG tablet Take 81 mg by mouth daily.   Yes Historical Provider, MD  atorvastatin (LIPITOR) 80 MG tablet Take 80 mg by mouth daily.   Yes Historical Provider, MD  benazepril-hydrochlorthiazide (LOTENSIN HCT) 20-25 MG tablet Take 1 tablet by mouth daily.  Yes Historical Provider, MD  calcium carbonate (TUMS - DOSED IN MG ELEMENTAL CALCIUM) 500 MG chewable tablet Chew 2 tablets by mouth daily as needed for indigestion or heartburn.   Yes Historical Provider, MD  cetirizine (ZYRTEC) 10 MG tablet Take 10 mg by mouth daily.     Yes Historical Provider, MD  diphenhydrAMINE (BENADRYL) 25 MG tablet Take 50 mg by mouth every 6 (six) hours as needed for allergies.   Yes Historical Provider, MD  fluticasone (FLONASE) 50 MCG/ACT nasal spray Place 1 spray into both nostrils daily.   Yes Historical Provider, MD  gabapentin (NEURONTIN) 100 MG capsule Take 100 mg by  mouth 3 (three) times daily.   Yes Historical Provider, MD  glimepiride (AMARYL) 4 MG tablet Take 1 tablet (4 mg total) by mouth daily with breakfast. Patient taking differently: Take 8 mg by mouth daily with breakfast.  02/11/14  Yes Pamella Pert, MD  hydrOXYzine (VISTARIL) 25 MG capsule Take 25-50 mg by mouth at bedtime as needed for itching.   Yes Historical Provider, MD  metFORMIN (GLUCOPHAGE-XR) 500 MG 24 hr tablet Take 1,500 mg by mouth daily with breakfast.   Yes Historical Provider, MD  Multiple Vitamins-Iron (DAILY MULTIVITAMINS/IRON PO) Take 1 tablet by mouth daily.    Yes Historical Provider, MD  oxymetazoline (NASAL DECONGESTANT SPRAY) 0.05 % nasal spray Place 1 spray into both nostrils daily as needed for congestion.    Yes Historical Provider, MD  sitaGLIPtin (JANUVIA) 100 MG tablet Take 100 mg by mouth daily.     Yes Historical Provider, MD  traZODone (DESYREL) 50 MG tablet Take 25-50 mg by mouth at bedtime as needed for sleep.   Yes Historical Provider, MD  benazepril (LOTENSIN) 20 MG tablet Take 1 tablet (20 mg total) by mouth daily. Patient not taking: Reported on 02/10/2016 02/11/14   Pamella Pert, MD  diazepam (VALIUM) 5 MG tablet Take 1 tablet (5 mg total) by mouth every 8 (eight) hours as needed (Vertigo). 02/10/16   Charlesetta Shanks, MD  hydrochlorothiazide (HYDRODIURIL) 25 MG tablet Take 1 tablet (25 mg total) by mouth daily. Patient not taking: Reported on 02/10/2016 02/11/14   Pamella Pert, MD  meclizine (ANTIVERT) 25 MG tablet Take 1 tablet (25 mg total) by mouth 3 (three) times daily as needed for dizziness. 02/10/16   Charlesetta Shanks, MD  permethrin (ELIMITE) 5 % cream Apply to affected area once Patient not taking: Reported on 02/10/2016 09/23/15   Heather Laisure, PA-C   BP 164/83 mmHg  Pulse 96  Temp(Src) 98.5 F (36.9 C) (Oral)  Resp 16  SpO2 99%  LMP 01/24/2016 Physical Exam  Constitutional: She is oriented to person, place, and time. She appears well-developed and  well-nourished. No distress.  HENT:  Head: Normocephalic and atraumatic.  Right Ear: External ear normal.  Left Ear: External ear normal.  Nose: Nose normal.  Mouth/Throat: Oropharynx is clear and moist.  Eyes: EOM are normal. Pupils are equal, round, and reactive to light. Right eye exhibits no discharge. Left eye exhibits no discharge. No scleral icterus.  Neck: Neck supple.  Cardiovascular: Normal rate, regular rhythm, normal heart sounds and intact distal pulses.   Pulmonary/Chest: Effort normal and breath sounds normal.  Abdominal: Soft. Bowel sounds are normal. She exhibits no distension. There is no tenderness.  Musculoskeletal: Normal range of motion. She exhibits no edema.  Neurological: She is alert and oriented to person, place, and time. She has normal strength. No cranial nerve deficit. She exhibits normal muscle tone.  Coordination normal. GCS eye subscore is 4. GCS verbal subscore is 5. GCS motor subscore is 6.  Normal cranial nerve examination. Normal finger-nose examination. Normal heel shin examination. Sensation intact to light touch 4 extremity is. Dix-Hallpike maneuver positive. When patient is transitioned upright seated from supine positive for nystagmus was most notable with leftward gaze.  Skin: Skin is warm, dry and intact.  Psychiatric: She has a normal mood and affect.    ED Course  Procedures (including critical care time) Labs Review Labs Reviewed  BASIC METABOLIC PANEL - Abnormal; Notable for the following:    Glucose, Bld 279 (*)    All other components within normal limits  CBC - Abnormal; Notable for the following:    Hemoglobin 11.4 (*)    HCT 34.4 (*)    All other components within normal limits  I-STAT TROPOININ, ED    Imaging Review Dg Chest 2 View  02/10/2016  CLINICAL DATA:  Mid chest pain, extreme dizziness worsening over the last 1.5 weeks. EXAM: CHEST  2 VIEW COMPARISON:  None. FINDINGS: The heart size and mediastinal contours are within  normal limits. Both lungs are clear. The visualized skeletal structures are unremarkable. IMPRESSION: No active cardiopulmonary disease. Electronically Signed   By: Kathreen Devoid   On: 02/10/2016 12:39   I have personally reviewed and evaluated these images and lab results as part of my medical decision-making.   EKG Interpretation   Date/Time:  Monday February 10 2016 11:34:35 EDT Ventricular Rate:  94 PR Interval:  184 QRS Duration: 94 QT Interval:  370 QTC Calculation: 463 R Axis:   21 Text Interpretation:  Sinus rhythm Probable left atrial enlargement  Confirmed by Johnney Killian, MD, Jeannie Done [phone]) on 02/10/2016 2:26:59 PM      MDM   Final diagnoses:  Vertigo   Patient has symptoms very consistent with benign positional vertigo. They are triggered by position change particularly from head and downward position to upward. As well, patient has reproducible nystagmus by Dix-Hallpike maneuver. No associated headache or neurologic dysfunction. Patient is otherwise clinically well. She has had atypical local chest pain without associated symptoms. I do not feel this is any correlation with the patient's vertiginous symptoms. EKG and cardiac enzymes are within normal limits. Patient does not currently have pain and this is been episodic. She is instructed to follow-up with her family physician for further diagnostic evaluation.    Charlesetta Shanks, MD 02/10/16 1520

## 2016-02-10 NOTE — ED Notes (Signed)
Pt reports intermittent central chest pressure and left neck pain onset 1.5 weeks ago. Pt request further evaluation related to continuous dizziness at present time; pt reports dizziness worse when left neck pain presents. Pt denies chest or neck pain at present time.

## 2016-07-02 ENCOUNTER — Other Ambulatory Visit: Payer: Self-pay | Admitting: Primary Care

## 2016-07-02 DIAGNOSIS — Z0000 Encounter for general adult medical examination without abnormal findings: Secondary | ICD-10-CM

## 2016-07-14 ENCOUNTER — Ambulatory Visit: Payer: BLUE CROSS/BLUE SHIELD

## 2016-09-08 ENCOUNTER — Encounter: Payer: Self-pay | Admitting: Podiatry

## 2016-09-08 ENCOUNTER — Ambulatory Visit (INDEPENDENT_AMBULATORY_CARE_PROVIDER_SITE_OTHER): Payer: BLUE CROSS/BLUE SHIELD | Admitting: Podiatry

## 2016-09-08 ENCOUNTER — Telehealth: Payer: Self-pay | Admitting: *Deleted

## 2016-09-08 ENCOUNTER — Ambulatory Visit: Payer: BLUE CROSS/BLUE SHIELD

## 2016-09-08 ENCOUNTER — Ambulatory Visit (INDEPENDENT_AMBULATORY_CARE_PROVIDER_SITE_OTHER): Payer: BLUE CROSS/BLUE SHIELD

## 2016-09-08 VITALS — BP 131/86 | HR 95 | Resp 16

## 2016-09-08 DIAGNOSIS — E119 Type 2 diabetes mellitus without complications: Secondary | ICD-10-CM

## 2016-09-08 DIAGNOSIS — M76821 Posterior tibial tendinitis, right leg: Secondary | ICD-10-CM

## 2016-09-08 DIAGNOSIS — M79672 Pain in left foot: Secondary | ICD-10-CM | POA: Diagnosis not present

## 2016-09-08 DIAGNOSIS — Q6650 Congenital pes planus, unspecified foot: Secondary | ICD-10-CM

## 2016-09-08 DIAGNOSIS — M79671 Pain in right foot: Secondary | ICD-10-CM

## 2016-09-08 MED ORDER — TRAMADOL HCL 50 MG PO TABS: 50.0000 mg | ORAL_TABLET | Freq: Three times a day (TID) | ORAL | 0 refills | Status: DC | PRN

## 2016-09-08 NOTE — Progress Notes (Signed)
   Subjective:    Patient ID: Kristine Bauer, female    DOB: [birth year], 51 y.o.   MRN: [id number]  HPI: She presents today chief complaint of pain to the medial aspect of her right foot states that several years ago she saw Dr. Blenda Mounts for similar pain. 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 She states that the pain is along side of the foot and she points to the navicular tuberosity.    Review of Systems  Constitutional: Positive for fatigue.  HENT: Positive for ear pain, sinus pressure and sneezing.   Eyes: Positive for itching.  Endocrine: Positive for heat intolerance, polydipsia and polyuria.  Musculoskeletal: Positive for arthralgias, back pain and gait problem.  Hematological: Positive for adenopathy.  All other systems reviewed and are negative.      Objective:   Physical Exam: Vital signs are stable alert and oriented 3. Pulses are palpable. Neurologic sensorium is intact. Deep tendon reflexes are intact. Muscle strength is 5 over 5 dorsiflexion plantar flexors and inverters everters all into the musculature is intact. Orthopedic evaluation demonstrates severe pes planus with pain on palpation of the posterior tibial tendon as it inserts on the navicular tuberosity. 3 views radiographs taken in the office today of the bilateral foot right most specifically demonstrates pes planus with a os navicularis and spurring of the ossicle. 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 Cutaneous evaluation does not demonstrate any type of open wounds.        Assessment & Plan:  Assessment: Diabetes mellitus not well controlled hemoglobin A1c currently 10. 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  Plan: I injected the area today with Kenalog and local anesthetic making certain not to inject into the tendon sheath itself. Place her in the Tri-Lock brace and she was  scanned for set of orthotics. I will follow-up with her in 1 month or after she's been in her orthotics whichever comes first.

## 2016-09-08 NOTE — Telephone Encounter (Signed)
Probably a cortisone flare and this would be a similar drug as he would have used. It make take a day or so to calm down.. If the brace doesn't help then consider a short cam walker.

## 2016-09-08 NOTE — Telephone Encounter (Addendum)
Pt states she is in severe pain after the visit and wanted to know if the ankle brace was causing. I told pt it was a steroid flare and to remove TriLok brace and elevate and ice and to reapply brace if up walking, and I would inform Dr. Milinda Pointer and call with more instructions. Pt called again and states it is not right that she should get an injection and feel worse and asked what Dr. Blenda Mounts gave her. I told her I didn't see her in the EPIC system and our schedulers could not find her in the old charting system, I told her I would call again with more information. I informed pt of Dr. Stephenie Acres orders. Then I asked Dr. Milinda Pointer if he would prescribe pt a pain medication and Dr. Milinda Pointer ordered Tramadol 50mg  #30 one tablet every 8 hours. I informed pt and called to the [facility].12/03/2016-Pt states she is concerned about discoloration at the injury site and injection site. I offered pt an appt to be reevaluated and she states she noticed the area Tuesday. I told pt it most likely was not related to the injection and may possibly be a new problem. Pt states she has a scab after a pedicure and I told her we could evaluate at the same appt. Pt states understanding and I transferred to schedulers.

## 2016-10-06 ENCOUNTER — Ambulatory Visit (INDEPENDENT_AMBULATORY_CARE_PROVIDER_SITE_OTHER): Payer: Self-pay | Admitting: Podiatry

## 2016-10-06 DIAGNOSIS — Q828 Other specified congenital malformations of skin: Secondary | ICD-10-CM

## 2016-10-06 DIAGNOSIS — M76821 Posterior tibial tendinitis, right leg: Secondary | ICD-10-CM

## 2016-10-06 NOTE — Progress Notes (Signed)
She presents today to pick up her orthotics for her posterior tibial tendinitis of the right lower extremity. She states that her nutrition issues and orthotics feel very good once we trialed them. She is concerned today about calluses to the forefoot bilateral.  Objective: Vital signs are stable she is alert and oriented 3 calluses to the forefoot along the medial aspect of the hallux right are thick without signs of skin breakdown.  Assessment: Posterior tibial tendinitis right with porokeratosis and callus bilateral foot.  Plan: Debridement of all reactive hyperkeratosis she was provided with both volar and home going instructions for care and use of her orthotics. Follow up with her in 4-6 weeks.

## 2016-10-06 NOTE — Patient Instructions (Signed)

## 2016-11-05 ENCOUNTER — Ambulatory Visit: Payer: BLUE CROSS/BLUE SHIELD | Admitting: Podiatry

## 2016-12-17 ENCOUNTER — Ambulatory Visit (INDEPENDENT_AMBULATORY_CARE_PROVIDER_SITE_OTHER): Payer: BLUE CROSS/BLUE SHIELD | Admitting: Podiatry

## 2016-12-17 ENCOUNTER — Encounter: Payer: Self-pay | Admitting: Podiatry

## 2016-12-17 DIAGNOSIS — E119 Type 2 diabetes mellitus without complications: Secondary | ICD-10-CM

## 2016-12-17 DIAGNOSIS — Q828 Other specified congenital malformations of skin: Secondary | ICD-10-CM

## 2016-12-17 DIAGNOSIS — M76821 Posterior tibial tendinitis, right leg: Secondary | ICD-10-CM | POA: Diagnosis not present

## 2016-12-20 NOTE — Progress Notes (Signed)
She presents today for follow-up of her posterior tibial tendon right. She is concerned about the discoloration of the skin. She states is doing better and the orthotics are comfortable and seem to be helping. She is concerned about calluses on the forefoot bilaterally.  Objective: Vital signs are stable she is alert and oriented 3. Pulses are palpable. Neurologic sensorium is intact. Calluses to the medial aspect of the right foot along the medial longitudinal arch are present. These are no open wounds no lesions no ulcerations. She does have some hypopigmentation this is secondary to the injection of steroid last visit.  Assessment: Well-healing posterior tibial tendinitis continue use of the orthotics. Porokeratosis.  Plan: Debridement of reactive hyperkeratoses. Continues to the orthotics follow up with [place] as needed.

## 2017-03-05 ENCOUNTER — Telehealth: Payer: Self-pay | Admitting: Podiatry

## 2017-03-05 MED ORDER — TRAMADOL HCL 50 MG PO TABS: 50.0000 mg | ORAL_TABLET | Freq: Three times a day (TID) | ORAL | 0 refills | Status: DC | PRN

## 2017-03-05 NOTE — Telephone Encounter (Signed)
Dr. Berton Lan refill of the tramadol as previously. faxed to [place].

## 2017-03-05 NOTE — Telephone Encounter (Signed)
Hi Valery, I'm calling to get a refill on my Tramadol. My [facility] is [facility] [address].

## 2017-04-20 DIAGNOSIS — J343 Hypertrophy of nasal turbinates: Secondary | ICD-10-CM

## 2017-04-20 DIAGNOSIS — H6983 Other specified disorders of Eustachian tube, bilateral: Secondary | ICD-10-CM

## 2017-04-20 DIAGNOSIS — H6993 Unspecified Eustachian tube disorder, bilateral: Secondary | ICD-10-CM

## 2017-04-20 DIAGNOSIS — J302 Other seasonal allergic rhinitis: Secondary | ICD-10-CM | POA: Insufficient documentation

## 2017-04-20 HISTORY — DX: Hypertrophy of nasal turbinates: J34.3

## 2017-04-20 HISTORY — DX: Other specified disorders of eustachian tube, bilateral: H69.83

## 2017-04-20 HISTORY — DX: Unspecified eustachian tube disorder, bilateral: H69.93

## 2017-07-05 ENCOUNTER — Other Ambulatory Visit: Payer: Self-pay | Admitting: Primary Care

## 2017-07-05 DIAGNOSIS — Z1231 Encounter for screening mammogram for malignant neoplasm of breast: Secondary | ICD-10-CM

## 2017-08-03 ENCOUNTER — Ambulatory Visit
Admission: RE | Admit: 2017-08-03 | Discharge: 2017-08-03 | Disposition: A | Discharge status: Home / Self Care | Payer: BLUE CROSS/BLUE SHIELD | Source: Ambulatory Visit | Attending: Primary Care | Admitting: Primary Care

## 2017-08-03 DIAGNOSIS — Z1231 Encounter for screening mammogram for malignant neoplasm of breast: Secondary | ICD-10-CM

## 2017-09-03 ENCOUNTER — Telehealth: Payer: Self-pay | Admitting: Podiatry

## 2017-09-03 NOTE — Telephone Encounter (Signed)
I was calling to see if I could get a refill of Tramadol sent to my pharmacy which is [facility] [address]. My number is [phone]. I have an appointment with Dr. Milinda Pointer on 14 September 2017. Thank you.

## 2017-09-03 NOTE — Telephone Encounter (Signed)
I informed pt she had not been seen in the office or prescribed the Tramadol in over 6 months, that she should use OTC ibuprofen and the package instructs if she tolerates and to ice the area 3-4 times a day for 15 minutes each session protecting the site from the ice with fabric. Pt states understanding.

## 2017-09-14 ENCOUNTER — Encounter: Payer: Self-pay | Admitting: Podiatry

## 2017-09-14 ENCOUNTER — Telehealth: Payer: Self-pay | Admitting: *Deleted

## 2017-09-14 ENCOUNTER — Ambulatory Visit: Payer: BLUE CROSS/BLUE SHIELD | Admitting: Podiatry

## 2017-09-14 DIAGNOSIS — Q828 Other specified congenital malformations of skin: Secondary | ICD-10-CM | POA: Diagnosis not present

## 2017-09-14 DIAGNOSIS — M779 Enthesopathy, unspecified: Principal | ICD-10-CM

## 2017-09-14 DIAGNOSIS — M7752 Other enthesopathy of left foot: Secondary | ICD-10-CM | POA: Diagnosis not present

## 2017-09-14 DIAGNOSIS — M778 Other enthesopathies, not elsewhere classified: Secondary | ICD-10-CM

## 2017-09-14 MED ORDER — TRAMADOL HCL 50 MG PO TABS: 50.0000 mg | ORAL_TABLET | Freq: Three times a day (TID) | ORAL | 0 refills | Status: DC | PRN

## 2017-09-14 NOTE — Progress Notes (Signed)
She presents today not wearing her orthotics setting that the orthotics caused her painful fifth toe of her left foot.  She states that she still has calluses plantar aspect of bilateral foot and pain to the fifth digit.  She states that I get a burning pain in the toe and the sheets even bother it.  Objective: Vital signs are stable she is alert and oriented x3.  Pulses are palpable.  Multiple calluses plantar aspect of the bilateral foot no open lesions.  Fifth digit of the left foot is painful on palpation.  There is no erythema cellulitis drainage or odor but there is pain on palpation of the PIPJ.  Assessment: Porokeratosis history of plantar fasciitis and posterior tibial tendinitis.  And capsulitis and fifth digit left foot.  Plan: Debrided all reactive hyperkeratosis I injected dexamethasone 2 mg and 2-1/2 mg of Marcaine to the point of maximal tenderness of her fifth digit left foot after sterile Betadine skin prep.  Tolerated procedure well without complications.  I also had Liliane Channel to evaluate her orthotics and to change them slightly.  This should alleviate her symptoms.  I also instructed the patient to purchase a new pair of tennis shoes.

## 2017-09-14 NOTE — Telephone Encounter (Signed)
Faxed rx for [facility]adol to [id number].

## 2018-06-06 ENCOUNTER — Other Ambulatory Visit: Payer: Self-pay | Admitting: Family Medicine

## 2018-06-06 ENCOUNTER — Other Ambulatory Visit: Payer: Self-pay | Admitting: Primary Care

## 2018-06-06 DIAGNOSIS — Z1231 Encounter for screening mammogram for malignant neoplasm of breast: Secondary | ICD-10-CM

## 2018-07-05 ENCOUNTER — Encounter: Payer: Self-pay | Admitting: Physical Therapy

## 2018-07-05 ENCOUNTER — Ambulatory Visit: Payer: BLUE CROSS/BLUE SHIELD | Attending: Family Medicine | Admitting: Physical Therapy

## 2018-07-05 DIAGNOSIS — M6281 Muscle weakness (generalized): Secondary | ICD-10-CM | POA: Diagnosis not present

## 2018-07-05 DIAGNOSIS — R2689 Other abnormalities of gait and mobility: Secondary | ICD-10-CM | POA: Diagnosis present

## 2018-07-05 NOTE — Therapy (Signed)
[facility] [address] [place], [place], [address] Phone: [phone]   Fax:  [phone]  Physical Therapy Evaluation  Patient Details  Name: Kristine Bauer MRN: [id number] Date of Birth: Mar 27, 1966 Referring Provider (PT): Eston Esters, NP   Encounter Date: 07/05/2018  PT End of Session - 07/05/18 1328    Visit Number  1    Number of Visits  12    Date for PT Re-Evaluation  08/16/18    Authorization Type  BCBS    PT Start Time  1105    PT Stop Time  1150    PT Time Calculation (min)  45 min    Activity Tolerance  Patient tolerated treatment well    Behavior During Therapy  [facility] for tasks assessed/performed       Past Medical History:  Diagnosis Date  . Anemia   . Asthma   . Chest pain    LEFT SIDED-SEVERAL YEARS  . Diabetes mellitus, type 2 ([place])   . GERD (gastroesophageal reflux disease)   . HTN (hypertension)     Past Surgical History:  Procedure Laterality Date  . ENDOMETRIAL BIOPSY      There were no vitals filed for this visit.   Subjective Assessment - 07/05/18 1108    Subjective  Pt relays her legs have become weak over the last couple months. She now has difficulty with stairs, getting in and out of car, putting on her socks and pants, getting in and out of tub. She did have fall at end of May trying to step up curb, and fell again 2 weeks ago when she was trying to change clothes and on one leg, she could not get up this time from floor and had to call for help.     Limitations  Lifting;Standing;Walking    How long can you walk comfortably?  grocery store distance    Diagnostic tests  none of legs    Patient Stated Goals  get up and down stairs better, have less difficulty with sit to stand and LE dressing, improve balance    Currently in Pain?  No/denies   denies pain but still has some back pain from time to time        [facility] PT Assessment - 07/05/18 0001      Assessment    Medical Diagnosis  bilat LE weakness    Referring Provider (PT)  Eston Esters, NP    Onset Date/Surgical Date  --   3 months onset of weakness   Next MD Visit  --   not scheduled   Prior Therapy  --   previous PT for back, sept 2018     Precautions   Precautions  Fall   history of falls but BERG test shows low risk     Balance Screen   Has the patient fallen in the past 6 months  Yes    How many times?  2    Has the patient had a decrease in activity level because of a fear of falling?   Yes    Is the patient reluctant to leave their home because of a fear of falling?   No      Home Environment   Living Environment  Private residence    Additional Comments  15 stairs to enter her apartment with Lt handrail going up      Prior Function   Level of Independence  Independent    Vocation  Part time  employment    Education officer, museum for [employer] / Strength   AROM / PROM / Strength  Strength      Strength   Overall Strength Comments  hip strength 4/5 bilat, knee strength and ankle strength 4+/5 bilat      Ambulation/Gait   Gait Comments  slightly slower speed and wider BOS      Balance   Balance Assessed  Yes      Standardized Balance Assessment   Standardized Balance Assessment  Berg Balance Test;Timed Up and Go Test;Five Times Sit to Stand    Five times sit to stand comments   23 seconds using UE support      Berg Balance Test   Sit to Stand  Able to stand  independently using hands    Standing Unsupported  Able to stand safely 2 minutes    Sitting with Back Unsupported but Feet Supported on Floor or Stool  Able to sit safely and securely 2 minutes    Stand to Sit  Sits safely with minimal use of hands    Transfers  Able to transfer safely, definite need of hands    Standing Unsupported with Eyes Closed  Able to stand 10 seconds with supervision    Standing Ubsupported with Feet Together  Able to place feet together independently and  stand 1 minute safely    From Standing, Reach Forward with Outstretched Arm  Can reach forward >12 cm safely (5")    From Standing Position, Pick up Object from Floor  Able to pick up shoe safely and easily    From Standing Position, Turn to Look Behind Over each Shoulder  Looks behind one side only/other side shows less weight shift    Turn 360 Degrees  Able to turn 360 degrees safely in 4 seconds or less    Standing Unsupported, Alternately Place Feet on Step/Stool  Able to stand independently and safely and complete 8 steps in 20 seconds    Standing Unsupported, One Foot in Front  Able to place foot tandem independently and hold 30 seconds    Standing on One Leg  Able to lift leg independently and hold equal to or more than 3 seconds    Total Score  49      Timed Up and Go Test   TUG  Normal TUG    Normal TUG (seconds)  12                Objective measurements completed on examination: See above findings.              PT Education - 07/05/18 1328    Education Details  HEP, exam findings, POC    Person(s) Educated  Patient    Methods  Explanation;Demonstration;Verbal cues;Handout    Comprehension  Verbalized understanding;Need further instruction          PT Long Term Goals - 07/05/18 1335      PT LONG TERM GOAL #1   Title  Pt will be I and compliant with HEP. 6 weeks 08/16/18    Status  New      PT LONG TERM GOAL #2   Title  Pt will improve knee strength to 5/5 bilat and hip strength to at least 4+/5 bilat to improve function. 6 weeks 08/16/18      PT LONG TERM GOAL #3   Title  Pt will be able to walk up/down 15 stairs using on  one handrail on Lt while carrying in groceries in Rt hand without having to lean agains wall. 6 weeks 08/16/18    Status  New      PT LONG TERM GOAL #4   Title  Pt will report no more than minor difficulty with sit to stand, getting in/out of car, and with LE dressing. 6 weeks 08/16/18    Status  New      PT LONG TERM GOAL  #5   Title  Pt will improve BERG balance >52 to show improved balance. 6 weeks 08/16/18    Status  New             Plan - 07/05/18 1330    Clinical Impression Statement  Pt presents with moderate bilat leg weakness and mild balance deficits but this may be more to weakness as BERG and TUG scores show low risk of falls. She was given HEP to begin for strength, endurance and balance. She will benefit from skilled PT to address her deficits.     History and Personal Factors relevant to plan of care:  DM,HTN,GERD,asthma,anemia    Clinical Presentation  Evolving    Clinical Presentation due to:  worsening leg weakness over last 3 weeks, 2 falls in last 6 months    Clinical Decision Making  Moderate    Rehab Potential  Good    PT Frequency  2x / week   1-2   PT Duration  6 weeks    PT Treatment/Interventions  Cryotherapy;Moist Heat;Gait training;Stair training;Therapeutic exercise;Balance training;Neuromuscular re-education;Manual techniques    PT Next Visit Plan  review HEP, LE strength and endurance, dynamic balance    PT Home Exercise Plan  [id number]    Consulted and Agree with Plan of Care  Patient       Patient will benefit from skilled therapeutic intervention in order to improve the following deficits and impairments:  Abnormal gait, Decreased activity tolerance, Decreased endurance, Decreased strength  Visit Diagnosis: Muscle weakness (generalized)  Other abnormalities of gait and mobility     Problem List Patient Active Problem List   Diagnosis Date Noted  . Diabetes mellitus, type 2 ([place])   . Chest pain   . HTN (hypertension)   . Asthma   . GERD (gastroesophageal reflux disease)   . Anemia     Debbe Odea 07/05/2018, 1:39 PM  [facility] [address] [place], [place], [address] Phone: [phone]   Fax:  [phone]  Name: Kristine Bauer MRN: [id number] Date of Birth: 03/25/1966

## 2018-07-12 ENCOUNTER — Ambulatory Visit: Payer: BLUE CROSS/BLUE SHIELD | Admitting: Physical Therapy

## 2018-07-15 ENCOUNTER — Ambulatory Visit: Payer: BLUE CROSS/BLUE SHIELD | Admitting: Physical Therapy

## 2018-07-19 ENCOUNTER — Ambulatory Visit: Payer: BLUE CROSS/BLUE SHIELD | Admitting: Physical Therapy

## 2018-07-19 ENCOUNTER — Other Ambulatory Visit (HOSPITAL_COMMUNITY): Payer: Self-pay | Admitting: Gastroenterology

## 2018-07-19 DIAGNOSIS — R1319 Other dysphagia: Secondary | ICD-10-CM

## 2018-07-19 DIAGNOSIS — R1310 Dysphagia, unspecified: Secondary | ICD-10-CM

## 2018-07-19 DIAGNOSIS — R945 Abnormal results of liver function studies: Principal | ICD-10-CM

## 2018-07-19 DIAGNOSIS — R7989 Other specified abnormal findings of blood chemistry: Secondary | ICD-10-CM

## 2018-07-20 ENCOUNTER — Ambulatory Visit: Payer: BLUE CROSS/BLUE SHIELD | Admitting: Physical Therapy

## 2018-07-21 ENCOUNTER — Ambulatory Visit (INDEPENDENT_AMBULATORY_CARE_PROVIDER_SITE_OTHER): Payer: BLUE CROSS/BLUE SHIELD | Admitting: Podiatry

## 2018-07-21 ENCOUNTER — Encounter: Payer: Self-pay | Admitting: Podiatry

## 2018-07-21 DIAGNOSIS — E119 Type 2 diabetes mellitus without complications: Secondary | ICD-10-CM

## 2018-07-21 DIAGNOSIS — M76821 Posterior tibial tendinitis, right leg: Secondary | ICD-10-CM | POA: Diagnosis not present

## 2018-07-21 DIAGNOSIS — M722 Plantar fascial fibromatosis: Secondary | ICD-10-CM

## 2018-07-21 DIAGNOSIS — Q828 Other specified congenital malformations of skin: Secondary | ICD-10-CM

## 2018-07-21 MED ORDER — TRAMADOL HCL 50 MG PO TABS: 50.0000 mg | ORAL_TABLET | Freq: Three times a day (TID) | ORAL | 0 refills | Status: DC | PRN

## 2018-07-22 ENCOUNTER — Ambulatory Visit: Payer: BLUE CROSS/BLUE SHIELD | Admitting: Physical Therapy

## 2018-07-24 NOTE — Progress Notes (Signed)
She presents today after having not seen her since January.  She states that she is got some calluses to the forefoot bilateral and would like to have them trimmed.  She is also interested in new orthotics.  Objective: Vital signs are stable alert and oriented x3.  Pulses are palpable.  She has pain on palpation left foot of the heel.  Consistent with plantar fasciitis.  She has painful elongated fifth toenails bilateral stating that she cannot get to them.  And she is got calluses to the forefoot bilateral porokeratotic lesions.  Objective: Vital signs are stable alert and oriented x3.  Fasciitis left porokeratosis nail dystrophy.  Plan: Discussed etiology pathology conservative surgical therapy debrided toenails debrided calluses injected the left heel plantar fascial area with 20 mg Kenalog 5 mg Marcaine point maximal tenderness.  And sent her to [facility] for a new set of orthotics.

## 2018-07-26 ENCOUNTER — Ambulatory Visit: Payer: BLUE CROSS/BLUE SHIELD | Admitting: Physical Therapy

## 2018-07-28 ENCOUNTER — Ambulatory Visit (HOSPITAL_COMMUNITY)
Admission: RE | Admit: 2018-07-28 | Discharge: 2018-07-28 | Disposition: A | Discharge status: Home / Self Care | Payer: BLUE CROSS/BLUE SHIELD | Source: Ambulatory Visit | Attending: Gastroenterology | Admitting: Gastroenterology

## 2018-07-28 DIAGNOSIS — R945 Abnormal results of liver function studies: Principal | ICD-10-CM

## 2018-07-28 DIAGNOSIS — K449 Diaphragmatic hernia without obstruction or gangrene: Secondary | ICD-10-CM | POA: Diagnosis not present

## 2018-07-28 DIAGNOSIS — R1310 Dysphagia, unspecified: Secondary | ICD-10-CM | POA: Diagnosis not present

## 2018-07-28 DIAGNOSIS — K224 Dyskinesia of esophagus: Secondary | ICD-10-CM | POA: Diagnosis not present

## 2018-07-28 DIAGNOSIS — R1319 Other dysphagia: Secondary | ICD-10-CM

## 2018-07-28 DIAGNOSIS — R7989 Other specified abnormal findings of blood chemistry: Secondary | ICD-10-CM

## 2018-07-29 ENCOUNTER — Ambulatory Visit: Payer: BLUE CROSS/BLUE SHIELD | Admitting: Physical Therapy

## 2018-08-02 ENCOUNTER — Telehealth: Payer: Self-pay | Admitting: Podiatry

## 2018-08-02 ENCOUNTER — Ambulatory Visit: Payer: BLUE CROSS/BLUE SHIELD | Admitting: Physical Therapy

## 2018-08-02 NOTE — Telephone Encounter (Signed)
Left message for pt to call me back regarding ordering a second pair of orthotics. I left message with benefit information.

## 2018-08-08 ENCOUNTER — Ambulatory Visit
Admission: RE | Admit: 2018-08-08 | Discharge: 2018-08-08 | Disposition: A | Discharge status: Home / Self Care | Payer: BLUE CROSS/BLUE SHIELD | Source: Ambulatory Visit | Attending: Family Medicine | Admitting: Family Medicine

## 2018-08-08 DIAGNOSIS — Z1231 Encounter for screening mammogram for malignant neoplasm of breast: Secondary | ICD-10-CM

## 2018-08-08 NOTE — Telephone Encounter (Signed)
Pt returned call and is wanting to go ahead and order the second pair of orthotics.

## 2018-08-08 NOTE — Telephone Encounter (Signed)
Charges have been entered

## 2018-08-12 ENCOUNTER — Encounter: Payer: Self-pay | Admitting: Neurology

## 2018-08-12 ENCOUNTER — Other Ambulatory Visit (INDEPENDENT_AMBULATORY_CARE_PROVIDER_SITE_OTHER): Payer: BLUE CROSS/BLUE SHIELD

## 2018-08-12 ENCOUNTER — Ambulatory Visit (INDEPENDENT_AMBULATORY_CARE_PROVIDER_SITE_OTHER): Payer: BLUE CROSS/BLUE SHIELD | Admitting: Neurology

## 2018-08-12 VITALS — BP 110/70 | HR 101 | Ht 64.0 in | Wt 213.5 lb

## 2018-08-12 DIAGNOSIS — M545 Low back pain, unspecified: Secondary | ICD-10-CM

## 2018-08-12 DIAGNOSIS — R292 Abnormal reflex: Secondary | ICD-10-CM

## 2018-08-12 DIAGNOSIS — R29898 Other symptoms and signs involving the musculoskeletal system: Secondary | ICD-10-CM

## 2018-08-12 DIAGNOSIS — G8929 Other chronic pain: Secondary | ICD-10-CM | POA: Diagnosis not present

## 2018-08-12 LAB — C-REACTIVE PROTEIN: CRP: 0.8 mg/dL (ref 0.5–20.0)

## 2018-08-12 LAB — SEDIMENTATION RATE: Sed Rate: 22 mm/hr (ref 0–30)

## 2018-08-12 LAB — CK: Total CK: 16213 U/L — ABNORMAL HIGH (ref 7–177)

## 2018-08-12 NOTE — Patient Instructions (Signed)
Check labs  MRI lumbar spine  We will call you with the results and let you know the next step

## 2018-08-12 NOTE — Progress Notes (Signed)
[facility] Clinic Note - Initial Visit   Date: 08/12/18  Kristine Bauer MRN: [id number] DOB: 1966-03-22   Dear Eston Esters, NP:  Thank you for your kind referral of Kristine Bauer for consultation of leg weakness. Although her history is well known to you, please allow [place] to reiterate it for the purpose of our medical record. The patient was accompanied to the clinic by self.   History of Present Illness: Kristine Bauer is a 52 y.o. right-handed African American female with hypertension, hyperlipidemia, diabetes mellitus (WCH8N 7) complicated by neuropathy presenting for evaluation of bilateral leg weakness.    Starting around October 2019, she began having difficulty climbing stairs and feels as if her legs are heavy.  Symptoms have steadily progressed and she has difficulty getting up from chairs, getting in and out of her car, transferring into her bathtub. Over thanksgiving holiday, she required assistance to help pull her up from the sofa.  She has started to avoid sitting at the break table at work, because of problems with standing back up. She had had three falls this year, first in Kristine 2019 and she tripped over the side walk, the latter two occurred while trying to put her pants on.  With her last fall, she hit her head. She also complains of low back pain which has been getting worse.  She was ordered PT by her PCP and went for initial evaluation only, due to cost she did not proceed with therapy sessions.   Rest does not help any of her symptoms.  She denies double vision, shortness of breath, difficulty swallowing/talking.   She had known diabetic neuropathy for the past year and has numbness of the feet.  She does not have numbness in her upper legs.   She works as a Radiation protection practitioner at [employer].  She lives alone in a 2nd floor apartment.   She also complains of right facial pain, described as throbbing and achy.   There is no numbness/tingling, or electrical shooting pain.    Past Medical History:  Diagnosis Date  . Allergic rhinitis   . Anemia   . Asthma   . Chest pain    LEFT SIDED-SEVERAL YEARS  . Diabetes mellitus, type 2 ([place])   . GERD (gastroesophageal reflux disease)   . HTN (hypertension)   . Hyperlipidemia   . Vitamin D deficiency     Past Surgical History:  Procedure Laterality Date  . ENDOMETRIAL BIOPSY       Medications:  Outpatient Encounter Medications as of 08/12/2018  Medication Sig  . albuterol (PROVENTIL HFA;VENTOLIN HFA) 108 (90 Base) MCG/ACT inhaler Inhale 1-2 puffs into the lungs every 6 (six) hours as needed for wheezing or shortness of breath.  Marland Kitchen amLODipine (NORVASC) 10 MG tablet Take 1 tablet (10 mg total) by mouth daily.  Marland Kitchen aspirin EC 81 MG tablet Take 81 mg by mouth daily.  Marland Kitchen atorvastatin (LIPITOR) 80 MG tablet Take 80 mg by mouth daily.  . benazepril-hydrochlorthiazide (LOTENSIN HCT) 20-25 MG tablet Take 1 tablet by mouth daily.  . cetirizine (ZYRTEC) 10 MG tablet Take 10 mg by mouth daily.    . diphenhydrAMINE (BENADRYL) 25 MG tablet Take 50 mg by mouth every 6 (six) hours as needed for allergies.  . fluticasone (FLONASE) 50 MCG/ACT nasal spray Place 1 spray into both nostrils daily.  Marland Kitchen gabapentin (NEURONTIN) 100 MG capsule Take 100 mg by mouth 3 (three) times daily.  Marland Kitchen glimepiride (AMARYL) 4  MG tablet Take 1 tablet (4 mg total) by mouth daily with breakfast. (Patient taking differently: Take 8 mg by mouth daily with breakfast. )  . hydrOXYzine (VISTARIL) 25 MG capsule Take 25-50 mg by mouth at bedtime as needed for itching.  . meclizine (ANTIVERT) 25 MG tablet Take 1 tablet (25 mg total) by mouth 3 (three) times daily as needed for dizziness.  . metFORMIN (GLUCOPHAGE-XR) 500 MG 24 hr tablet Take 1,500 mg by mouth daily with breakfast.  . Multiple Vitamins-Iron (DAILY MULTIVITAMINS/IRON PO) Take 1 tablet by mouth daily.   Marland Kitchen oxymetazoline (NASAL DECONGESTANT  SPRAY) 0.05 % nasal spray Place 1 spray into both nostrils daily as needed for congestion.   . pantoprazole (PROTONIX) 40 MG tablet   . permethrin (ELIMITE) 5 % cream Apply to affected area once  . sitaGLIPtin (JANUVIA) 100 MG tablet Take 100 mg by mouth daily.    . traMADol (ULTRAM) 50 MG tablet Take 1 tablet (50 mg total) by mouth every 8 (eight) hours as needed.  . [DISCONTINUED] benazepril (LOTENSIN) 20 MG tablet Take 1 tablet (20 mg total) by mouth daily.   No facility-administered encounter medications on file as of 08/12/2018.      Allergies: No Known Allergies  Family History: Family History  Problem Relation Age of Onset  . Hypertension Father   . Glaucoma Father   . Hypertension Mother   . Diabetes type II Mother   . Multiple sclerosis Sister   . Breast cancer Neg Hx     Social History: Social History   Tobacco Use  . Smoking status: Never Smoker  . Smokeless tobacco: Never Used  Substance Use Topics  . Alcohol use: No  . Drug use: Never   Social History   Social History Narrative   Lives in an apartment on the 2nd floor.  No children.  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  Education: college.     Review of Systems:  CONSTITUTIONAL: No fevers, chills, night sweats, or weight loss.   EYES: No visual changes or eye pain ENT: No hearing changes.  No history of nose bleeds.   RESPIRATORY: No cough, wheezing and shortness of breath.   CARDIOVASCULAR: Negative for chest pain, and palpitations.   GI: Negative for abdominal discomfort, blood in stools or black stools.  No recent change in bowel habits.   GU:  No history of incontinence.   MUSCLOSKELETAL: No history of joint pain or swelling.  No myalgias.   SKIN: Negative for lesions, rash, and itching.   HEMATOLOGY/ONCOLOGY: Negative for prolonged bleeding, bruising easily, and swollen nodes.  No history of cancer.   ENDOCRINE: Negative for cold or heat intolerance, polydipsia or goiter.   PSYCH:  Np depression or  anxiety symptoms.   NEURO: As Above.   Vital Signs:  BP 110/70   Pulse (!) 101   Ht 5' 4"  (1.626 m)   Wt 213 lb 8 oz (96.8 kg)   SpO2 98%   BMI 36.65 kg/m    General Medical Exam:   General:  Well appearing, comfortable.   Eyes/ENT: see cranial nerve examination.   Neck: No masses appreciated.  Full range of motion without tenderness.  No carotid bruits. Respiratory:  Clear to auscultation, good air entry bilaterally.   Cardiac:  Regular rate and rhythm, no murmur.   Extremities:  No deformities, edema, or skin discoloration.  Skin:  No rashes or lesions.  Neurological Exam: MENTAL STATUS including orientation to time, place, person, recent and  remote memory, attention span and concentration, language, and fund of knowledge is normal.  Speech is not dysarthric.  CRANIAL NERVES: II:  No visual field defects.  Unremarkable fundi.   III-IV-VI: Pupils equal round and reactive to light.  Normal conjugate, extra-ocular eye movements in all directions of gaze.  No nystagmus.  No ptosis.   V:  Normal facial sensation.    VII:  Normal facial symmetry and movements.  No pathologic facial reflexes.  VIII:  Normal hearing and vestibular function.   IX-X:  Normal palatal movement.   XI:  Normal shoulder shrug and head rotation.   XII:  Normal tongue strength and range of motion, no deviation or fasciculation.  MOTOR:  No atrophy, fasciculations or abnormal movements.  No pronator drift.  Tone is normal.    Right Upper Extremity:    Left Upper Extremity:    Deltoid  5/5   Deltoid  5/5   Biceps  5/5   Biceps  5/5   Triceps  5/5   Triceps  5/5   Wrist extensors  5/5   Wrist extensors  5/5   Wrist flexors  5/5   Wrist flexors  5/5   Finger extensors  5/5   Finger extensors  5/5   Finger flexors  5/5   Finger flexors  5/5   Dorsal interossei  5/5   Dorsal interossei  5/5   Abductor pollicis  5/5   Abductor pollicis  5/5   Tone (Ashworth scale)  0  Tone (Ashworth scale)  0   Right Lower  Extremity:    Left Lower Extremity:    Hip flexors  4/5   Hip flexors  4/5   Hip extensors  5/5   Hip extensors  5/5   Adductor 4/5  Adductor 4/5  Abductor 5/5  Abductor 4/5  Knee flexors  5/5   Knee flexors  5/5   Knee extensors  5/5   Knee extensors  5/5   Dorsiflexors  5/5   Dorsiflexors  5/5   Plantarflexors  5/5   Plantarflexors  5/5   Toe extensors  5/5   Toe extensors  5/5   Toe flexors  5/5   Toe flexors  5/5   Tone (Ashworth scale)  0  Tone (Ashworth scale)  0   MSRs:  Right                                                                 Left brachioradialis 2+  brachioradialis 2+  biceps 2+  biceps 2+  triceps 2+  triceps 2+  patellar 1+  Patellar 1+  ankle jerk 0  ankle jerk 0  Hoffman no  Hoffman no  plantar response down  plantar response down   SENSORY:  Normal and symmetric perception of light touch, pinprick, vibration, and proprioception.Marland Kitchen   COORDINATION/GAIT: Normal finger-to- nose-finger and heel-to-shin.  Intact rapid alternating movements bilaterally.  She is able to rise from a chair without using arms, but very slowly and with effort (1st attempt).  Gait appears slow, stable.  She can walk on heels and toes.  Unsteadiness with tandem gait.   IMPRESSION: 1.  Proximal leg weakness affecting hip flexors and adductors, which is most concerning for L2-4 radiculopathy.  Alternatively, myopathy can  also manifesting with similar pattern of weakness. She does not have proximal arm or bulbar weakness.  Lack of fatigable weakness makes myasthenia less likely.  I will screen her for myopathy, inflammatory conditions, and neuromuscular junction disorder.  MRI lumbar spine will be ordered to look for nerve impingement at the L2-4 levels.  If there is no structural pathology, the next step will be NCS/EMG of the legs.   2.  Right facial pain, described as achy/throbbing. She does not have features of trigeminal neuralgia.  No abnormalities on her cranial nerve exam is  reassuring.  We discussed potential possibilities such as TMJ, sinus disease, or headache.  If symptoms get worse, CNS imaging can be ordered.    PLAN/RECOMMENDATIONS:  Check CK, aldolase, ESR, CRP, ANA, myasthenia gravis panel MRI lumbar spine without contrast Fall precautions recommended, start using a cane for stability Consider NCS/EMG, if the above is nondiagnostic  Further recommendations pending results   Thank you for allowing me to participate in patient's care.  If I can answer any additional questions, I would be pleased to do so.    Sincerely,    Kristine Ozment K. Posey Pronto, DO

## 2018-08-15 ENCOUNTER — Telehealth: Payer: Self-pay | Admitting: Neurology

## 2018-08-15 NOTE — Telephone Encounter (Signed)
Called and informed patient that her muscle enzyme is markedly elevated (CK > 16,000) indicating that her weakness is stemming from a primary muscle pathology, moreso than nerve impingement in the back or neuromuscular junction disorder. ESR and CRP is normal.  I will check additional labs for autoimmune myopathy and proceed with NCS/EMG.  Plan: Check myositis panel, ACE, Coxsackie antibody, SPEP with IFE.  If these are negative, will check HMGCoA reductase antibody  ASAP NCS/EMG of the left arm and leg - myopathy protocol to guide potential muscle biopsy STOP Lipitor  Cancel MRI lumbar spine   Kristine K. Posey Pronto, DO

## 2018-08-16 NOTE — Telephone Encounter (Signed)
Patient given instructions and note faxed.

## 2018-08-17 ENCOUNTER — Telehealth: Payer: Self-pay | Admitting: Neurology

## 2018-08-17 NOTE — Telephone Encounter (Signed)
Patient is calling about muscle enzyme elevation and taking her off of the lipitor. What would that do for her? Please call her back at [phone]. Thanks!

## 2018-08-18 ENCOUNTER — Other Ambulatory Visit: Payer: Self-pay | Admitting: *Deleted

## 2018-08-18 DIAGNOSIS — M545 Low back pain, unspecified: Secondary | ICD-10-CM

## 2018-08-18 DIAGNOSIS — G729 Myopathy, unspecified: Secondary | ICD-10-CM

## 2018-08-18 DIAGNOSIS — G8929 Other chronic pain: Secondary | ICD-10-CM

## 2018-08-18 DIAGNOSIS — R29898 Other symptoms and signs involving the musculoskeletal system: Secondary | ICD-10-CM

## 2018-08-18 NOTE — Telephone Encounter (Signed)
I called patient and she will be coming in on Monday for labs.  MRI cancelled.

## 2018-08-19 LAB — ALDOLASE: Aldolase: 136.8 U/L — ABNORMAL HIGH (ref <=8.1)

## 2018-08-19 LAB — ANA: ANA: NEGATIVE

## 2018-08-19 LAB — MYASTHENIA GRAVIS PANEL 2
A CHR BINDING ABS: <0.3 nmol/L
ACETYLCHOL MODUL AB: 18 %{inhibition}

## 2018-08-22 ENCOUNTER — Ambulatory Visit: Payer: BLUE CROSS/BLUE SHIELD | Admitting: Orthotics

## 2018-08-22 ENCOUNTER — Other Ambulatory Visit (INDEPENDENT_AMBULATORY_CARE_PROVIDER_SITE_OTHER): Payer: BLUE CROSS/BLUE SHIELD

## 2018-08-22 DIAGNOSIS — Q828 Other specified congenital malformations of skin: Secondary | ICD-10-CM

## 2018-08-22 DIAGNOSIS — M76821 Posterior tibial tendinitis, right leg: Secondary | ICD-10-CM

## 2018-08-22 DIAGNOSIS — E119 Type 2 diabetes mellitus without complications: Secondary | ICD-10-CM

## 2018-08-22 DIAGNOSIS — M722 Plantar fascial fibromatosis: Secondary | ICD-10-CM

## 2018-08-22 DIAGNOSIS — G729 Myopathy, unspecified: Secondary | ICD-10-CM | POA: Diagnosis not present

## 2018-08-22 NOTE — Progress Notes (Signed)
Patient came in today to pick up custom made foot orthotics.  The goals were accomplished and the patient reported no dissatisfaction with said orthotics.  Patient was advised of breakin period and how to report any issues. 

## 2018-08-26 ENCOUNTER — Telehealth: Payer: Self-pay | Admitting: Neurology

## 2018-08-26 LAB — PROTEIN ELECTROPHORESIS, SERUM
Albumin ELP: 3.8 g/dL (ref 3.8–4.8)
Alpha 1: 0.3 g/dL (ref 0.2–0.3)
Alpha 2: 0.7 g/dL (ref 0.5–0.9)
Beta 2: 0.3 g/dL (ref 0.2–0.5)
Beta Globulin: 0.4 g/dL (ref 0.4–0.6)
GAMMA GLOBULIN: 0.9 g/dL (ref 0.8–1.7)
Total Protein: 6.5 g/dL (ref 6.1–8.1)

## 2018-08-26 LAB — CK: CK TOTAL: 9280 U/L — AB (ref 29–143)

## 2018-08-26 LAB — COXSACKIE A VIRUS ANTIBODIES
Coxsackie A16 Ab: <1:8 {titer}
Coxsackie A7 Ab: <1:8 {titer}
Coxsackie A9 Ab: <1:8 {titer}

## 2018-08-26 LAB — COXSACKIE B VIRUS ANTIBODIES
Coxsackie B3 Ab: <1:8 {titer}
Coxsackie B5 Ab: <1:8 {titer}
Coxsackie B6 Ab: <1:8 {titer}

## 2018-08-26 LAB — IMMUNOFIXATION ELECTROPHORESIS
IGM, SERUM: 20 mg/dL — AB (ref 50–300)
IgG (Immunoglobin G), Serum: 1054 mg/dL (ref 600–1640)
Immunofix Electr Int: NOT DETECTED
Immunoglobulin A: 146 mg/dL (ref 47–310)

## 2018-08-26 LAB — ANGIOTENSIN CONVERTING ENZYME: ANGIOTENSIN-CONVERTING ENZYME: 9 U/L (ref 9–67)

## 2018-08-26 NOTE — Telephone Encounter (Signed)
Called patient back and left message letting her know that we do not prescribe antibiotics and instructed her to call her PCP.

## 2018-08-26 NOTE — Telephone Encounter (Signed)
Patient called regarding her having a headache from Sinusitis and wanting to know if she can have a  Z Pack called in for her to [facility] on [address]? She said she has to go to work at 2:00PM.  Please Call. Thanks

## 2018-08-29 LAB — MYOSITIS PANEL III

## 2018-08-30 ENCOUNTER — Telehealth: Payer: Self-pay | Admitting: *Deleted

## 2018-08-30 ENCOUNTER — Other Ambulatory Visit: Payer: Self-pay | Admitting: *Deleted

## 2018-08-30 DIAGNOSIS — R29898 Other symptoms and signs involving the musculoskeletal system: Secondary | ICD-10-CM

## 2018-08-30 DIAGNOSIS — G729 Myopathy, unspecified: Secondary | ICD-10-CM

## 2018-08-30 NOTE — Telephone Encounter (Signed)
Patient given results and will be in on Thursday for EMG.

## 2018-08-30 NOTE — Telephone Encounter (Signed)
-----   Message from Alda Berthold, DO sent at 08/29/2018  2:27 PM EST ----- Please inform patient that her autoimmune panel for muscle disease is negative.  I recommend NCS/EMG of the left arm and leg to further evaluate her weakness - there is an opening on 3pm this Thursday.  Thanks.

## 2018-09-01 ENCOUNTER — Ambulatory Visit (INDEPENDENT_AMBULATORY_CARE_PROVIDER_SITE_OTHER): Payer: BLUE CROSS/BLUE SHIELD | Admitting: Neurology

## 2018-09-01 DIAGNOSIS — G729 Myopathy, unspecified: Secondary | ICD-10-CM

## 2018-09-01 DIAGNOSIS — R29898 Other symptoms and signs involving the musculoskeletal system: Secondary | ICD-10-CM | POA: Diagnosis not present

## 2018-09-01 NOTE — Progress Notes (Signed)
Follow-up Visit   Date: 09/01/18    Kristine Bauer MRN: [id number] DOB: Apr 03, 1966   Interim History: Kristine Bauer is a 52 y.o. right-handed African American female with hypertension, hyperlipidemia, diabetes mellitus (NTZ0Y 7) complicated by neuropathy returning to the clinic for follow-up of hyperCKemia and leg weakness.  The patient was accompanied to the clinic by self.  History of present illness: Starting around October 2019, she began having difficulty climbing stairs and feels as if her legs are heavy.  Symptoms have steadily progressed and she has difficulty getting up from chairs, getting in and out of her car, transferring into her bathtub. Over thanksgiving holiday, she required assistance to help pull her up from the sofa.  She has started to avoid sitting at the break table at work, because of problems with standing back up. She had had three falls this year, first in May 2019 and she tripped over the side walk, the latter two occurred while trying to put her pants on.  With her last fall, she hit her head. She also complains of low back pain which has been getting worse.  She was ordered PT by her PCP and went for initial evaluation only, due to cost she did not proceed with therapy sessions.   Rest does not help any of her symptoms.  She denies double vision, shortness of breath, difficulty swallowing/talking.   She had known diabetic neuropathy for the past year and has numbness of the feet.  She does not have numbness in her upper legs.   She works as a Radiation protection practitioner at [employer].  She lives alone in a 2nd floor apartment.   She also complains of right facial pain, described as throbbing and achy.  There is no numbness/tingling, or electrical shooting pain.   UPDATE 09/01/2018:  She is here for follow-up visit and electrodiagnostic testing of the left side.  Since her last visit, her labs indicated severely elevated CK > 16,000  > 9000.   Subsequent labs including myositis panel, inflammatory markers, coxsackie panel, ACE, and SPEP is normal.  She has stopped Lipitor and states that her leg weakness is somewhat better, such that she can stand up from low chairs without asking for help.  She continues to complain of a dull headache and was given Singulair by her PCP for possible allergies.  Headache and neck pain is worse with neck rotation, there is no vision changes, confusion, or paresthesias of the face.  Medications:  Current Outpatient Medications on File Prior to Visit  Medication Sig Dispense Refill  . albuterol (PROVENTIL HFA;VENTOLIN HFA) 108 (90 Base) MCG/ACT inhaler Inhale 1-2 puffs into the lungs every 6 (six) hours as needed for wheezing or shortness of breath.    Marland Kitchen amLODipine (NORVASC) 10 MG tablet Take 1 tablet (10 mg total) by mouth daily. 30 tablet 0  . aspirin EC 81 MG tablet Take 81 mg by mouth daily.    . benazepril-hydrochlorthiazide (LOTENSIN HCT) 20-25 MG tablet Take 1 tablet by mouth daily.  0  . cephALEXin (KEFLEX) 500 MG capsule Take 500 mg by mouth 4 (four) times daily.    . cetirizine (ZYRTEC) 10 MG tablet Take 10 mg by mouth daily.      . diphenhydrAMINE (BENADRYL) 25 MG tablet Take 50 mg by mouth every 6 (six) hours as needed for allergies.    . fluticasone (FLONASE) 50 MCG/ACT nasal spray Place 1 spray into both nostrils daily.    Marland Kitchen gabapentin (  NEURONTIN) 100 MG capsule Take 100 mg by mouth 3 (three) times daily.    Marland Kitchen glimepiride (AMARYL) 4 MG tablet Take 1 tablet (4 mg total) by mouth daily with breakfast. (Patient taking differently: Take 8 mg by mouth daily with breakfast. ) 30 tablet 0  . hydrOXYzine (VISTARIL) 25 MG capsule Take 25-50 mg by mouth at bedtime as needed for itching.    . meclizine (ANTIVERT) 25 MG tablet Take 1 tablet (25 mg total) by mouth 3 (three) times daily as needed for dizziness. 30 tablet 0  . metFORMIN (GLUCOPHAGE-XR) 500 MG 24 hr tablet Take 1,500 mg by mouth daily with  breakfast.    . Multiple Vitamins-Iron (DAILY MULTIVITAMINS/IRON PO) Take 1 tablet by mouth daily.     Marland Kitchen oxymetazoline (NASAL DECONGESTANT SPRAY) 0.05 % nasal spray Place 1 spray into both nostrils daily as needed for congestion.     . permethrin (ELIMITE) 5 % cream Apply to affected area once 60 g 0  . sitaGLIPtin (JANUVIA) 100 MG tablet Take 100 mg by mouth daily.      . traMADol (ULTRAM) 50 MG tablet Take 1 tablet (50 mg total) by mouth every 8 (eight) hours as needed. 20 tablet 0   No current facility-administered medications on file prior to visit.     Allergies:  Allergies  Allergen Reactions  . Statins Other (See Comments)    Myopathy    Review of Systems:  CONSTITUTIONAL: No fevers, chills, night sweats, or weight loss.  EYES: No visual changes or eye pain ENT: No hearing changes.  No history of nose bleeds.   RESPIRATORY: No cough, wheezing and shortness of breath.   CARDIOVASCULAR: Negative for chest pain, and palpitations.   GI: Negative for abdominal discomfort, blood in stools or black stools.  No recent change in bowel habits.   GU:  No history of incontinence.   MUSCLOSKELETAL: No history of joint pain or swelling.  No myalgias.   SKIN: Negative for lesions, rash, and itching.   ENDOCRINE: Negative for cold or heat intolerance, polydipsia or goiter.   PSYCH:  + depression or anxiety symptoms.   NEURO: As Above.   Vital Signs:  There were no vitals taken for this visit.  Neurological Exam: MENTAL STATUS including orientation to time, place, person, recent and remote memory, attention span and concentration, language, and fund of knowledge is normal.  Speech is not dysarthric.  CRANIAL NERVES:  Pupils equal round and reactive to light.  Normal conjugate, extra-ocular eye movements in all directions of gaze.  No ptosis. Normal facial sensation.  Face is symmetric. Palate elevates symmetrically.  Tongue is midline.  MOTOR:  Motor strength is 5/5 in all extremities,  except 4+/5 proximally in the arms and legs.  No atrophy, fasciculations or abnormal movements.  No pronator drift.  Tone is normal.    COORDINATION/GAIT: Gait is slow, stable.  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  IMPRESSION/PLAN: 1. 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  Normal inflammatory markers, ANA, and myositis panel.   Next step is muscle biopsy of the right deltoid and  rectus femoris muscles to evaluate for inflammatory changes.  She will be referred to general surgery for this. Check HMG-CoA reductase antibody Stay off all statins  2.  Tension headache, recommend conservative therapies with tylenol, heat/ice to neck.  Return to clinic in 2 weeks, recheck CK prior to this  Thank you for allowing me to participate in patient's care.  If I can answer any additional questions, I would be pleased to do so.    Sincerely,    Taryne Kiger K. Posey Pronto, DO

## 2018-09-01 NOTE — Procedures (Signed)
[facility]  [address]  [place], [place] [address] Tel: [phone] Fax:  [phone] Test Date:  09/01/2018  Patient: Kristine Bauer DOB: 1966/04/05 Physician: Narda Amber, DO  Sex: Female Height: 5\' 4"  Ref Phys: Narda Amber, DO  ID#: [id number] Temp: 33.0C Technician:    Patient Complaints: This is a 52 year old female with proximal arm and leg weakness and hyper CK anemia referred for evaluation of myopathy.  NCV & EMG Findings: Extensive electrodiagnostic testing of the left upper and lower extremity shows:  1. Left median, ulnar, sural, and superficial peroneal sensory responses are within normal limits. 2. Left median and ulnar motor responses are within normal limits.  Left peroneal motor response is reduced at the extensor digitorum brevis, and normal at the tibialis anterior. 3. There is evidence of small, complex motor units involving the biceps, triceps, deltoid, rectus femoris, gluteus medius, and gastrocnemius muscles with evidence of early recruitment.  Fibrillation potentials are seen in the biceps, triceps, and deltoid muscles.  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   ___________________________ Narda Amber, DO    Nerve Conduction Studies Anti Sensory Summary Table   Site NR Peak (ms) Norm Peak (ms) P-T Amp (V) Norm P-T Amp  Left Median Anti Sensory (2nd Digit)  Wrist    3.5 <3.6 29.9 >15  Left Sup Peroneal Anti Sensory (Ant Lat Mall)  12 cm    2.6 <4.6 5.4 >4  Left Sural Anti Sensory (Lat Mall)  Calf    3.0 <4.6 7.4 >4  Left Ulnar Anti Sensory (5th Digit)  Wrist    2.5 <3.1 18.0 >10   Motor Summary Table   Site NR Onset (ms) Norm Onset (ms) O-P Amp (mV) Norm O-P Amp Site1 Site2 Delta-0 (ms) Dist (cm) Vel (m/s) Norm Vel (m/s)  Left Median Motor (Abd Poll Brev)  Wrist    3.4 <4.0 7.5 >6 Elbow Wrist 5.7 30.0 53 >50  Elbow     9.1  7.5         Left Peroneal Motor (Ext Dig Brev)  Ankle    3.7 <6.0 1.4 >2.5 B Fib Ankle 7.8 35.0 45 >40  B Fib    11.5  1.3  Poplt B Fib 1.1 8.0 73 >40  Poplt    12.6  1.3         Left Peroneal TA Motor (Tib Ant)  Fib Head    2.9 <4.5 5.2 >3 Poplit Fib Head 1.2 7.0 58 >40  Poplit    4.1  5.1         Left Ulnar Motor (Abd Dig Minimi)  Wrist    2.1 <3.1 8.3 >7 B Elbow Wrist 3.8 23.0 61 >50  B Elbow    5.9  7.1  A Elbow B Elbow 1.9 10.0 53 >50  A Elbow    7.8  6.9          EMG   Side Muscle Ins Act Fibs Psw Fasc Number Recrt Dur Dur. Amp Amp. Poly Poly. Comment  Left AntTibialis Nml Nml Nml Nml Nml Nml Nml Nml Nml Nml Nml Nml N/A  Left Gastroc Nml Nml Nml Nml Nml Nml Some 1+ Some 1- Some 1+ E.R.  Left RectFemoris Nml Nml Nml Nml Nml Nml Some 1+ Some 1- Some 1+ E.R.  Left GluteusMed Nml Nml Nml Nml Nml Nml Some 1+ Some 1- Some 1+ N/A  Left  BicepsFemS Nml Nml Nml Nml Nml Nml Nml Nml Nml Nml Nml Nml N/A  Left 1stDorInt Nml Nml Nml Nml Nml Nml Nml Nml Nml Nml Nml Nml N/A  Left PronatorTeres Nml Nml Nml Nml Nml Nml Nml Nml Nml Nml Nml Nml N/A  Left Biceps Nml 1+ Nml Nml Nml Nml Some 1+ Some 1+ Some 1+ E.R.  Left Triceps Nml 1+ Nml Nml Nml Nml Some 1+ Some 1+ Some 1+ E.R.  Left Deltoid Nml 1+ Nml Nml Nml Nml Some 1+ Some 1+ Some 1+ E.R.      Waveforms:

## 2018-09-02 ENCOUNTER — Other Ambulatory Visit: Payer: Self-pay | Admitting: *Deleted

## 2018-09-02 DIAGNOSIS — G729 Myopathy, unspecified: Secondary | ICD-10-CM

## 2018-09-09 ENCOUNTER — Telehealth: Payer: Self-pay | Admitting: Neurology

## 2018-09-09 NOTE — Telephone Encounter (Signed)
Patient instructed not to take the pravastatin.  She does not want to come in at 8 am on 09-20-2018 but will come in for labs.

## 2018-09-09 NOTE — Telephone Encounter (Signed)
Wants to speak to someone about a possible medication that her PCP wants to put her on please call

## 2018-09-14 ENCOUNTER — Other Ambulatory Visit: Payer: BLUE CROSS/BLUE SHIELD

## 2018-09-14 ENCOUNTER — Encounter: Payer: Self-pay | Admitting: Neurology

## 2018-09-14 NOTE — Telephone Encounter (Signed)
This encounter was created in error - please disregard.

## 2018-09-16 ENCOUNTER — Telehealth: Payer: Self-pay | Admitting: Neurology

## 2018-09-16 DIAGNOSIS — G729 Myopathy, unspecified: Secondary | ICD-10-CM

## 2018-09-16 NOTE — Telephone Encounter (Signed)
Check CK and anti-HMG-CoA reductase ([facility] test ID [id number]).

## 2018-09-16 NOTE — Telephone Encounter (Signed)
Order entered for CK and [facility] lab. LMOM letting patient know she could have lab performed but would need to bring [facility] paper with her to the lab when she goes to have collected (to pick up when she comes to check in at our front desk). [facility] form is at the front desk for her to pick up.

## 2018-09-16 NOTE — Telephone Encounter (Signed)
Patient is calling in to find out if recent labs were ordered so that she can come get them done today. Please call her back and let her know if she can come get them done today. Thanks!

## 2018-09-16 NOTE — Telephone Encounter (Signed)
Patient came in on Wednesday afternoon for labs, but we were unable to determine what labs were needed. Caryl Pina documented on 09/09/2018 that she was come in for labs.  Dr. Posey Pronto - please advise what labs to order for patient.

## 2018-10-07 ENCOUNTER — Encounter

## 2018-10-11 ENCOUNTER — Telehealth: Payer: Self-pay | Admitting: *Deleted

## 2018-10-11 NOTE — Telephone Encounter (Signed)
Called patient and left message for her to call me back about checking her CK level.

## 2019-04-05 ENCOUNTER — Ambulatory Visit (HOSPITAL_COMMUNITY)
Admission: EM | Admit: 2019-04-05 | Discharge: 2019-04-05 | Disposition: A | Discharge status: Home / Self Care | Payer: BC Managed Care – PPO | Attending: Emergency Medicine | Admitting: Emergency Medicine

## 2019-04-05 ENCOUNTER — Other Ambulatory Visit: Payer: Self-pay

## 2019-04-05 ENCOUNTER — Encounter (HOSPITAL_COMMUNITY): Payer: Self-pay | Admitting: Emergency Medicine

## 2019-04-05 ENCOUNTER — Ambulatory Visit (INDEPENDENT_AMBULATORY_CARE_PROVIDER_SITE_OTHER): Payer: BC Managed Care – PPO

## 2019-04-05 DIAGNOSIS — R079 Chest pain, unspecified: Secondary | ICD-10-CM | POA: Insufficient documentation

## 2019-04-05 DIAGNOSIS — Z20828 Contact with and (suspected) exposure to other viral communicable diseases: Secondary | ICD-10-CM | POA: Diagnosis not present

## 2019-04-05 DIAGNOSIS — J45901 Unspecified asthma with (acute) exacerbation: Secondary | ICD-10-CM

## 2019-04-05 MED ORDER — ALUM & MAG HYDROXIDE-SIMETH 200-200-20 MG/5ML PO SUSP
ORAL | Status: AC
  Filled 2019-04-05: qty 30

## 2019-04-05 MED ORDER — LIDOCAINE VISCOUS HCL 2 % MT SOLN
OROMUCOSAL | Status: AC
  Filled 2019-04-05: qty 15

## 2019-04-05 MED ORDER — ALBUTEROL SULFATE HFA 108 (90 BASE) MCG/ACT IN AERS
INHALATION_SPRAY | RESPIRATORY_TRACT | Status: AC
  Filled 2019-04-05: qty 6.7

## 2019-04-05 MED ORDER — ALUM & MAG HYDROXIDE-SIMETH 200-200-20 MG/5ML PO SUSP
30.0000 mL | Freq: Once | ORAL | Status: AC
  Administered 2019-04-05: 30 mL via ORAL

## 2019-04-05 MED ORDER — LIDOCAINE VISCOUS HCL 2 % MT SOLN
15.0000 mL | Freq: Once | OROMUCOSAL | Status: AC
  Administered 2019-04-05: 15 mL via ORAL

## 2019-04-05 MED ORDER — AEROCHAMBER PLUS FLO-VU MISC
1.0000 | Freq: Once | Status: AC
  Administered 2019-04-05: 1

## 2019-04-05 MED ORDER — AEROCHAMBER PLUS FLO-VU LARGE MISC
Status: AC
  Filled 2019-04-05: qty 1

## 2019-04-05 NOTE — ED Triage Notes (Signed)
Pt reports cough and chest discomfort for weeks. It has worsened over the last 3-4 days.

## 2019-04-05 NOTE — Discharge Instructions (Addendum)
2 puffs from your albuterol inhaler using your spacer every 4 hours for 2 days, then 2 puffs from your inhaler with your spacer every 6 hours for 2 days, then as needed.  If it gives you palpitations or makes you feel excessively anxious, you can back off on it sooner.  Restart your GERD medicine.

## 2019-04-05 NOTE — ED Provider Notes (Signed)
HPI  SUBJECTIVE:  Kristine Bauer is a 53 y.o. female who presents with 1 month of wheezing, dry cough, shortness of breath, which got worse 3 or 4 days ago.  She reports substernal chest pain described as pressure, stabbing, soreness, intermittent, lasting minutes.  1 to 2 weeks of body aches, headaches, nasal congestion, postnasal drip.  Chest congestion for "months".  No fevers, sinus pain or pressure.  No sore throat.  No loss of smell or sense of taste.  Occasional diarrhea for several weeks, but none consistently. no known exposure to COVID.  No dyspnea on exertion, abdominal pain, lower extremity edema, calf pain.  States that she discussed these symptoms with her PMD about a month ago and was told that she needed a chest x-ray.  She has been using her albuterol inhaler without relief.  No aggravating factors.  She has not tried anything else for this.  Second, she reports left-sided chest pain that she has had for "a while", but states that it has become more intense over the past 1 to 2 weeks.  It is sharp, burning, intermittent, lasts minutes and then resolves.  It is not described as pressure, heaviness.  It is worse with lying down.  It is relieved with 2 aspirin.  She has not tried anything else for this.  No exertional component.  No radiation of this pain down her arm, through to her back, up her neck.  No nausea, diaphoresis.  No belching, water brash.  States that she has been evaluated by cardiology for this and it was not thought to be due to coronary artery disease.  Record review confirms this. seen by cards in 2012 for left-sided chest pain.  She has a past medical history of asthma, does not have a spacer, diabetes, GERD, states that she does not take her GERD medication.  Also hypercholesterolemia, hypertension.  No history of PE, DVT, cancer.  Family history significant for mother with MI at age 31 and 79.  FTD:[id number], Brayton Layman, NP     Past Medical History:  Diagnosis  Date  . Allergic rhinitis   . Anemia   . Asthma   . Chest pain    LEFT SIDED-SEVERAL YEARS  . Diabetes mellitus, type 2 ([place])   . GERD (gastroesophageal reflux disease)   . HTN (hypertension)   . Hyperlipidemia   . Vitamin D deficiency     Past Surgical History:  Procedure Laterality Date  . ENDOMETRIAL BIOPSY      Family History  Problem Relation Age of Onset  . Hypertension Father   . Glaucoma Father   . Hypertension Mother   . Diabetes type II Mother   . Multiple sclerosis Sister   . Breast cancer Neg Hx     Social History   Tobacco Use  . Smoking status: Never Smoker  . Smokeless tobacco: Never Used  Substance Use Topics  . Alcohol use: No  . Drug use: Never    No current facility-administered medications for this encounter.   Current Outpatient Medications:  .  albuterol (PROVENTIL HFA;VENTOLIN HFA) 108 (90 Base) MCG/ACT inhaler, Inhale 1-2 puffs into the lungs every 6 (six) hours as needed for wheezing or shortness of breath., Disp: , Rfl:  .  amLODipine (NORVASC) 10 MG tablet, Take 1 tablet (10 mg total) by mouth daily., Disp: 30 tablet, Rfl: 0 .  aspirin EC 81 MG tablet, Take 81 mg by mouth daily., Disp: , Rfl:  .  benazepril-hydrochlorthiazide (LOTENSIN HCT) 20-25  MG tablet, Take 1 tablet by mouth daily., Disp: , Rfl: 0 .  cetirizine (ZYRTEC) 10 MG tablet, Take 10 mg by mouth daily.  , Disp: , Rfl:  .  fluticasone (FLONASE) 50 MCG/ACT nasal spray, Place 1 spray into both nostrils daily., Disp: , Rfl:  .  gabapentin (NEURONTIN) 100 MG capsule, Take 100 mg by mouth 3 (three) times daily., Disp: , Rfl:  .  glimepiride (AMARYL) 4 MG tablet, Take 1 tablet (4 mg total) by mouth daily with breakfast. (Patient taking differently: Take 8 mg by mouth daily with breakfast. ), Disp: 30 tablet, Rfl: 0 .  hydrOXYzine (VISTARIL) 25 MG capsule, Take 25-50 mg by mouth at bedtime as needed for itching., Disp: , Rfl:  .  meclizine (ANTIVERT) 25 MG tablet, Take 1 tablet (25 mg  total) by mouth 3 (three) times daily as needed for dizziness., Disp: 30 tablet, Rfl: 0 .  metFORMIN (GLUCOPHAGE-XR) 500 MG 24 hr tablet, Take 1,500 mg by mouth daily with breakfast., Disp: , Rfl:  .  Multiple Vitamins-Iron (DAILY MULTIVITAMINS/IRON PO), Take 1 tablet by mouth daily. , Disp: , Rfl:  .  oxymetazoline (NASAL DECONGESTANT SPRAY) 0.05 % nasal spray, Place 1 spray into both nostrils daily as needed for congestion. , Disp: , Rfl:  .  permethrin (ELIMITE) 5 % cream, Apply to affected area once, Disp: 60 g, Rfl: 0 .  sitaGLIPtin (JANUVIA) 100 MG tablet, Take 100 mg by mouth daily.  , Disp: , Rfl:   Allergies  Allergen Reactions  . Statins Other (See Comments)    Myopathy     ROS  As noted in HPI.   Physical Exam  BP 126/67   Pulse (!) 105   Temp 98.6 F (37 C) (Oral)   Resp 18   LMP 02/23/2019   SpO2 99%   Constitutional: Well developed, well nourished, no acute distress Eyes: PERRL, EOMI, conjunctiva normal bilaterally HENT: Normocephalic, atraumatic,mucus membranes moist Respiratory: Clear to auscultation bilaterally, no rales, no wheezing, no rhonchi.  Fair air movement.  Positive tenderness along the sternal costochondral junction.  No other tenderness along the other costochondral junctions.  No lateral chest wall tenderness. Cardiovascular: Normal rate and rhythm, no murmurs, no gallops, no rubs GI: Soft, nondistended, normal bowel sounds, nontender, no rebound, no guarding skin: No rash, skin intact Musculoskeletal: Calves symmetric bilaterally, trace edema bilaterally, no tenderness, Neurologic: Alert & oriented x 3, CN III-XII grossly intact, no motor deficits, sensation grossly intact Psychiatric: Speech and behavior appropriate   ED Course   Medications  aerochamber plus with mask device 1 each (1 each Other Given 04/05/19 1501)  alum & mag hydroxide-simeth (MAALOX/MYLANTA) 200-200-20 MG/5ML suspension 30 mL (30 mLs Oral Given 04/05/19 1526)    And   lidocaine (XYLOCAINE) 2 % viscous mouth solution 15 mL (15 mLs Oral Given 04/05/19 1526)  AeroChamber Plus Flo-Vu Large MISC (has no administration in time range)  albuterol (VENTOLIN HFA) 108 (90 Base) MCG/ACT inhaler (has no administration in time range)  alum & mag hydroxide-simeth (MAALOX/MYLANTA) 200-200-20 MG/5ML suspension (has no administration in time range)  lidocaine (XYLOCAINE) 2 % viscous mouth solution (has no administration in time range)    Orders Placed This Encounter  Procedures  . Novel Coronavirus, NAA (hospital order; send-out to ref lab)    Standing Status:   Standing    Number of Occurrences:   1  . DG Chest 2 View    Standing Status:   Standing    Number  of Occurrences:   1    Order Specific Question:   Reason for Exam (SYMPTOM  OR DIAGNOSIS REQUIRED)    Answer:   r/o pna, effusion, acute process  . ED EKG    Standing Status:   Standing    Number of Occurrences:   1    Order Specific Question:   Reason for Exam    Answer:   Chest Pain  . EKG 12-Lead    Standing Status:   Standing    Number of Occurrences:   1   No results found for this or any previous visit (from the past 24 hour(s)). Dg Chest 2 View  Result Date: 04/05/2019 CLINICAL DATA:  Cough and chest pain EXAM: CHEST - 2 VIEW COMPARISON:  February 10, 2016. FINDINGS: There is no edema or consolidation. Heart size and pulmonary vascularity are normal. No adenopathy. No pneumothorax. There is degenerative change in the thoracic spine. IMPRESSION: No edema or consolidation. Electronically Signed   By: Lowella Grip III M.D.   On: 04/05/2019 15:22    ED Clinical Impression  1. Mild asthma with exacerbation, unspecified whether persistent   2. Chest pain, unspecified type      ED Assessment/Plan  Patient with reproducible chest wall tenderness.  Substernal chest pain this could be a costochondritis versus asthma exacerbation.   We will give her 2 puffs from her albuterol inhaler using a spacer, do a  chest x-ray as PMD requested one a month ago.   Patient's albuterol inhaler noted to not have any medication coming from it.  Will replace that with an albuterol inhaler from here.  Also giving her spacer.  Showed her how to use it.  For the left-sided chest pain, she states that she has had this for some time and had a negative cardiac work-up in past.  Will check EKG.  Mild tachycardia noted, however, doubt PE.  Doubt ACS as this is been going on for years.  Suspect it could be GERD.  Giving GI cocktail.  Will reevaluate.  Reviewed imaging independently.  Normal chest x-ray.. See radiology report for full details.  EKG: Borderline sinus tachycardia, rate 101.  Normal axis, normal intervals.  No hypertrophy.  No ST-T wave changes. No changes compared to EKG from 02/10/2016.  Sent off COVID test As patient is high risk.  On reevaluation, patient states that she feels better, her substernal chest tightness has improved after the albuterol, her left sided chest pain is also gotten better after the GI cocktail.  I suspect that the central chest pain was due to asthma/bronchospasm.  We checked her inhaler and no medication was coming out of it.  Gave her a replacement inhaler today here in clinic.  There is no pneumonia on today's x-ray.  We will send her home with regular albuterol using her spaces 2 puffs every 4 hours for 2 days, 2 puffs every 6 hours for 2 days, then as needed, also have her restart her GERD medicine.  Patient states that she does not need more.  She will follow-up with her primary care physician if not getting better in about 5 days. she will follow-up with Dr. Acie Fredrickson, her cardiologist that she has seen in the past for her left-sided chest pain.  Discussed with patient that I feel that she would benefit from another evaluation of this, but I think it can be done on an outpatient basis.  Discussed imaging, MDM, treatment plan, and plan for follow-up with patient Discussed sn/sx  that  should prompt return to the ED. patient agrees with plan.   Meds ordered this encounter  Medications  . aerochamber plus with mask device 1 each  . AND Linked Order Group   . alum & mag hydroxide-simeth (MAALOX/MYLANTA) 200-200-20 MG/5ML suspension 30 mL   . lidocaine (XYLOCAINE) 2 % viscous mouth solution 15 mL    *This clinic note was created using [employer]. Therefore, there may be occasional mistakes despite careful proofreading.  ?   Melynda Ripple, MD 04/05/19 1724

## 2019-04-05 NOTE — ED Triage Notes (Signed)
PT's PCP sent her for chest xray.

## 2019-04-07 ENCOUNTER — Telehealth (HOSPITAL_COMMUNITY): Payer: Self-pay | Admitting: Emergency Medicine

## 2019-04-07 NOTE — Telephone Encounter (Signed)
Pt called wanting an update on her COVID results.  Results are not available at this time.  Patient needing work note extended until results are back.  Next shift is tomorrow, does not work again until Tuesday.  Will extend note until tomorrow with the expectation that results will be back before Tuesday.  Patient also states she is feeling worse.  Asked if the doctor could write her an antibiotic.  This RN explained why an antibiotic is not the appropriate treatment for a virus.  Also discussed symptoms, and provided OTC recommendations for symptom management.  Return precautions reviewed.

## 2019-04-09 LAB — NOVEL CORONAVIRUS, NAA (HOSP ORDER, SEND-OUT TO REF LAB; TAT 18-24 HRS): SARS-CoV-2, NAA: NOT DETECTED

## 2019-04-11 ENCOUNTER — Encounter (HOSPITAL_COMMUNITY): Payer: Self-pay | Admitting: Emergency Medicine

## 2019-04-11 ENCOUNTER — Ambulatory Visit (HOSPITAL_COMMUNITY)
Admission: EM | Admit: 2019-04-11 | Discharge: 2019-04-11 | Disposition: A | Discharge status: Home / Self Care | Payer: BC Managed Care – PPO | Attending: Internal Medicine | Admitting: Internal Medicine

## 2019-04-11 ENCOUNTER — Other Ambulatory Visit: Payer: Self-pay

## 2019-04-11 ENCOUNTER — Other Ambulatory Visit: Payer: BC Managed Care – PPO

## 2019-04-11 DIAGNOSIS — G729 Myopathy, unspecified: Secondary | ICD-10-CM

## 2019-04-11 DIAGNOSIS — J22 Unspecified acute lower respiratory infection: Secondary | ICD-10-CM | POA: Diagnosis not present

## 2019-04-11 DIAGNOSIS — R0789 Other chest pain: Secondary | ICD-10-CM | POA: Diagnosis not present

## 2019-04-11 MED ORDER — BENZONATATE 200 MG PO CAPS: 200.0000 mg | ORAL_CAPSULE | Freq: Three times a day (TID) | ORAL | 0 refills | Status: AC | PRN

## 2019-04-11 MED ORDER — NAPROXEN 375 MG PO TABS: 375.0000 mg | ORAL_TABLET | Freq: Two times a day (BID) | ORAL | 0 refills | Status: DC

## 2019-04-11 MED ORDER — DOXYCYCLINE HYCLATE 100 MG PO CAPS: 100.0000 mg | ORAL_CAPSULE | Freq: Two times a day (BID) | ORAL | 0 refills | Status: AC

## 2019-04-11 NOTE — Discharge Instructions (Signed)
Begin doxycycline twice daily for the next 10 days to cover infection in sinuses as well as lungs. Continue albuterol inhaler as needed for shortness of breath Tessalon every 8 hours for cough Please try Naprosyn as alternative with food twice daily for chest discomfort Continue pantoprazole daily, may supplement with Maalox or Tums  Please follow-up with cardiology and primary care if symptoms persisting. Follow-up in emergency room if developing worsening shortness of breath or chest pain

## 2019-04-11 NOTE — ED Triage Notes (Signed)
Pt states "I feel exactly the same as my last visit and I want an antibiotic.".

## 2019-04-11 NOTE — ED Provider Notes (Signed)
[facility]    CSN: [id number] Arrival date & time: 04/11/19  1333      History   Chief Complaint No chief complaint on file. Chest pain, cough  HPI Evely Gainey is a 53 y.o. female history of asthma, DM type II, GERD, hypertension, hyperlipidemia, presenting today for evaluation of chest pain and cough.  Patient states for the past 1 to 2 months she has had a cough.  Over the past few weeks she has had associated chest discomfort with it.  She has also noticed some right ear discomfort and feels pressure on the right side of her face.  She is using nasal spray, but unsure of which one. States that she mainly notes it centrally, but will occasionally feel it under her right breast, or in her epigastrium.  Has had some nausea and vomiting yesterday.  Notes that the vomiting is mainly mucus production and not true vomiting.  Denies any shortness of breath.  Denies leg pain or leg swelling.  Denies tobacco use.  Denies previous DVT/PE.  Denies exogenous estrogen use.  Denies recent travel or immobilization.  Patient was evaluated here on 7/29 approximately 5 to 6 days ago with negative x-ray and EKG not suggestive of ACS.  She feels as if this is infection or inflammation and requesting an antibiotic.  She also recently started taking pantoprazole as last time she had some mild improvement with GI cocktail.  She has been taking this for approximately 5 days.  She denies worsening of her symptoms since she was last seen, just no improvement.  Does occasionally notice symptoms worse at nighttime or with lying flat.  Denies association with meals.  HPI  Past Medical History:  Diagnosis Date   Allergic rhinitis    Anemia    Asthma    Chest pain    LEFT SIDED-SEVERAL YEARS   Diabetes mellitus, type 2 (HCC)    GERD (gastroesophageal reflux disease)    HTN (hypertension)    Hyperlipidemia    Vitamin D deficiency     Patient Active Problem List   Diagnosis Date Noted    Proximal leg weakness 08/12/2018   Diabetes mellitus, type 2 (HCC)    Chest pain    HTN (hypertension)    Asthma    GERD (gastroesophageal reflux disease)    Anemia     Past Surgical History:  Procedure Laterality Date   ENDOMETRIAL BIOPSY      OB History   No obstetric history on file.      Home Medications    Prior to Admission medications   Medication Sig Start Date End Date Taking? Authorizing Provider  albuterol (PROVENTIL HFA;VENTOLIN HFA) 108 (90 Base) MCG/ACT inhaler Inhale 1-2 puffs into the lungs every 6 (six) hours as needed for wheezing or shortness of breath.    [provider]  amLODipine (NORVASC) 10 MG tablet Take 1 tablet (10 mg total) by mouth daily. 02/11/14   Pamella Pert, MD  aspirin EC 81 MG tablet Take 81 mg by mouth daily.    [provider]  benazepril-hydrochlorthiazide (LOTENSIN HCT) 20-25 MG tablet Take 1 tablet by mouth daily. 07/15/18   [provider]  benzonatate (TESSALON) 200 MG capsule Take 1 capsule (200 mg total) by mouth 3 (three) times daily as needed for up to 7 days for cough. 04/11/19 04/18/19  Caniyah Murley C, PA-C  cetirizine (ZYRTEC) 10 MG tablet Take 10 mg by mouth daily.      [provider]  doxycycline (VIBRAMYCIN) 100 MG capsule Take 1 capsule (100 mg total) by mouth 2 (two) times daily for 10 days. 04/11/19 04/21/19  Emme Rosenau C, PA-C  fluticasone (FLONASE) 50 MCG/ACT nasal spray Place 1 spray into both nostrils daily.    [provider]  gabapentin (NEURONTIN) 100 MG capsule Take 100 mg by mouth 3 (three) times daily.    [provider]  glimepiride (AMARYL) 4 MG tablet Take 1 tablet (4 mg total) by mouth daily with breakfast. Patient taking differently: Take 8 mg by mouth daily with breakfast.  02/11/14   Pamella Pert, MD  hydrOXYzine (VISTARIL) 25 MG capsule Take 25-50 mg by mouth at bedtime as needed for itching.    [provider]  meclizine  (ANTIVERT) 25 MG tablet Take 1 tablet (25 mg total) by mouth 3 (three) times daily as needed for dizziness. 02/10/16   Charlesetta Shanks, MD  metFORMIN (GLUCOPHAGE-XR) 500 MG 24 hr tablet Take 1,500 mg by mouth daily with breakfast.    [provider]  Multiple Vitamins-Iron (DAILY MULTIVITAMINS/IRON PO) Take 1 tablet by mouth daily.     [provider]  naproxen (NAPROSYN) 375 MG tablet Take 1 tablet (375 mg total) by mouth 2 (two) times daily. 04/11/19   Malyia Moro C, PA-C  oxymetazoline (NASAL DECONGESTANT SPRAY) 0.05 % nasal spray Place 1 spray into both nostrils daily as needed for congestion.     [provider]  permethrin (ELIMITE) 5 % cream Apply to affected area once 09/23/15   Hyman Bible, PA-C  sitaGLIPtin (JANUVIA) 100 MG tablet Take 100 mg by mouth daily.      [provider]  diphenhydrAMINE (BENADRYL) 25 MG tablet Take 50 mg by mouth every 6 (six) hours as needed for allergies.  04/05/19  [provider]    Family History Family History  Problem Relation Age of Onset   Hypertension Father    Glaucoma Father    Hypertension Mother    Diabetes type II Mother    Multiple sclerosis Sister    Breast cancer Neg Hx     Social History Social History   Tobacco Use   Smoking status: Never Smoker   Smokeless tobacco: Never Used  Substance Use Topics   Alcohol use: No   Drug use: Never     Allergies   Statins   Review of Systems Review of Systems  Constitutional: Negative for activity change, appetite change, chills, fatigue and fever.  HENT: Negative for congestion, ear pain, rhinorrhea, sinus pressure, sore throat and trouble swallowing.   Eyes: Negative for discharge and redness.  Respiratory: Positive for cough. Negative for chest tightness and shortness of breath.   Cardiovascular: Positive for chest pain.  Gastrointestinal: Positive for nausea. Negative for abdominal pain, diarrhea and vomiting.    Musculoskeletal: Negative for myalgias.  Skin: Negative for rash.  Neurological: Negative for dizziness, light-headedness and headaches.     Physical Exam Triage Vital Signs ED Triage Vitals  Enc Vitals Group     BP 04/11/19 1353 (!) 141/61     Pulse Rate 04/11/19 1353 96     Resp 04/11/19 1353 18     Temp 04/11/19 1353 98.5 F (36.9 C)     Temp src --      SpO2 04/11/19 1353 100 %     Weight --      Height --      Head Circumference --      Peak Flow --  Pain Score 04/11/19 1354 8     Pain Loc --      Pain Edu? --      Excl. in [place]? --    No data found.  Updated Vital Signs BP (!) 141/61    Pulse 96    Temp 98.5 F (36.9 C)    Resp 18    SpO2 100%  Heart rate rechecked and was 91 Visual Acuity Right Eye Distance:   Left Eye Distance:   Bilateral Distance:    Right Eye Near:   Left Eye Near:    Bilateral Near:     Physical Exam Vitals signs and nursing note reviewed.  Constitutional:      General: She is not in acute distress.    Appearance: She is well-developed.  HENT:     Head: Normocephalic and atraumatic.     Ears:     Comments: Bilateral ears without tenderness to palpation of external auricle, tragus and mastoid, EAC's without erythema or swelling, TM's with good bony landmarks and cone of light. Non erythematous.    Mouth/Throat:     Comments: Oral mucosa pink and moist, no tonsillar enlargement or exudate. Posterior pharynx patent and nonerythematous, no uvula deviation or swelling. Normal phonation.  Eyes:     Conjunctiva/sclera: Conjunctivae normal.  Neck:     Musculoskeletal: Neck supple.  Cardiovascular:     Rate and Rhythm: Normal rate and regular rhythm.     Heart sounds: No murmur.  Pulmonary:     Effort: Pulmonary effort is normal. No respiratory distress.     Breath sounds: Normal breath sounds.     Comments: Breathing comfortably at rest, CTABL, no wheezing, rales or other adventitious sounds auscultated  Anterior right lower  chest with tenderness to palpation Abdominal:     Palpations: Abdomen is soft.     Tenderness: There is no abdominal tenderness.     Comments: Abdomen soft, nondistended, nontender to light deep palpation throughout entire abdomen and epigastrium  Musculoskeletal:     Comments: Bilateral lower extremity symmetric, no calf tenderness erythema or swelling, negative Homans  Skin:    General: Skin is warm and dry.  Neurological:     Mental Status: She is alert.      UC Treatments / Results  Labs (all labs ordered are listed, but only abnormal results are displayed) Labs Reviewed - No data to display  EKG   Radiology No results found.  Procedures Procedures (including critical care time)  Medications Ordered in UC Medications - No data to display  Initial Impression / Assessment and Plan / UC Course  I have reviewed the triage vital signs and the nursing notes.  Pertinent labs & imaging results that were available during my care of the patient were reviewed by me and considered in my medical decision making (see chart for details).     Patient with 1 month of cough with associated chest pain.  Recent work-up negative with negative x-ray and unrevealing EKG.  Exam benign today.  Does have some mild reproducible chest discomfort to palpation.  Given length of cough will go ahead and cover for atypicals with doxycycline as well as sinusitis given right-sided pressure.  Tessalon as needed for cough, continue albuterol as needed.  Given reproducible discomfort also recommending anti-inflammatories, will try Naprosyn as alternative to ibuprofen which she has been taking.  Continue pantoprazole, supplement with Maalox or Tums as needed.  Patient symptoms have not progressed worsened or changed since she was  last seen.  She remained stable today with stable vital signs without fever, tachycardia or hypoxia.  Do not suspect underlying ACS or PE at this time.  Recommending to follow-up with  cardiology and primary care outpatient if symptoms persisting.  Follow-up in emergency room for further evaluation if symptoms worsening, developing shortness of breath or worsening chest pain.Discussed strict return precautions. Patient verbalized understanding and is agreeable with plan.  Final Clinical Impressions(s) / UC Diagnoses   Final diagnoses:  Lower respiratory infection (e.g., bronchitis, pneumonia, pneumonitis, pulmonitis)  Atypical chest pain     Discharge Instructions     Begin doxycycline twice daily for the next 10 days to cover infection in sinuses as well as lungs. Continue albuterol inhaler as needed for shortness of breath Tessalon every 8 hours for cough Please try Naprosyn as alternative with food twice daily for chest discomfort Continue pantoprazole daily, may supplement with Maalox or Tums  Please follow-up with cardiology and primary care if symptoms persisting. Follow-up in emergency room if developing worsening shortness of breath or chest pain   ED Prescriptions    Medication Sig Dispense Auth. Provider   doxycycline (VIBRAMYCIN) 100 MG capsule Take 1 capsule (100 mg total) by mouth 2 (two) times daily for 10 days. 20 capsule Pao Haffey C, PA-C   benzonatate (TESSALON) 200 MG capsule Take 1 capsule (200 mg total) by mouth 3 (three) times daily as needed for up to 7 days for cough. 28 capsule Zhoe Catania C, PA-C   naproxen (NAPROSYN) 375 MG tablet Take 1 tablet (375 mg total) by mouth 2 (two) times daily. 20 tablet Adeleine Pask, [place] C, PA-C     Controlled Substance Prescriptions [place] Controlled Substance Registry consulted? Not Applicable   Janith Lima, [place] 04/11/19 1430

## 2019-04-12 LAB — CK: Total CK: 3867 U/L — ABNORMAL HIGH (ref 29–143)

## 2019-04-13 ENCOUNTER — Telehealth: Payer: Self-pay

## 2019-04-13 NOTE — Telephone Encounter (Signed)
Called patient and she will schedule a visit.

## 2019-04-13 NOTE — Telephone Encounter (Signed)
-----   Message from Alda Berthold, DO sent at 04/13/2019  7:43 AM EDT ----- Patient was last seen in 2019 and needs a follow-up visit.  CK is lower than before, but still elevated.

## 2019-06-12 ENCOUNTER — Ambulatory Visit: Payer: BC Managed Care – PPO | Admitting: Neurology

## 2019-07-04 ENCOUNTER — Telehealth: Payer: Self-pay | Admitting: Podiatry

## 2019-07-04 NOTE — Telephone Encounter (Signed)
Patient called and said rt ankle and foot in pain and swollen wanted to know if soaking in epsom salt will work or does she need to be seen

## 2019-07-04 NOTE — Telephone Encounter (Signed)
I called pt and asked if she had injured the foot and ankle. Pt denied injury. I told pt she should rest, ice and elevate until seen in office, ice 3-4 times daily for 15 minutes/session and protect the skin from the ice with a light cloth, and warm water and epsom salt may not be good at this point it may cause swelling. I offered to transfer pt to schedulers and she states she would prefer to try the ice 1st.

## 2019-07-06 ENCOUNTER — Ambulatory Visit
Admission: EM | Admit: 2019-07-06 | Discharge: 2019-07-06 | Disposition: A | Discharge status: Home / Self Care | Payer: BC Managed Care – PPO | Attending: Emergency Medicine | Admitting: Emergency Medicine

## 2019-07-06 ENCOUNTER — Other Ambulatory Visit: Payer: Self-pay

## 2019-07-06 DIAGNOSIS — B888 Other specified infestations: Secondary | ICD-10-CM

## 2019-07-06 DIAGNOSIS — R600 Localized edema: Secondary | ICD-10-CM | POA: Diagnosis not present

## 2019-07-06 MED ORDER — TRIAMCINOLONE ACETONIDE 0.5 % EX OINT: 1.0000 "application " | TOPICAL_OINTMENT | Freq: Two times a day (BID) | CUTANEOUS | 0 refills | Status: DC

## 2019-07-06 NOTE — Discharge Instructions (Addendum)
Important to wear TED hose, compression socks every day. May do Epson salt soaks to help with edema. Do ankle exercises attached. Follow-up with primary care for further work-up/evaluation of possible diabetic neuropathy.

## 2019-07-06 NOTE — ED Provider Notes (Signed)
[facility]    CSN: [id number] Arrival date & time: 07/06/19  1702      History   Chief Complaint Chief Complaint  Patient presents with   Leg Swelling    HPI Kristine Bauer is a 53 y.o. female with history of hypertension, diabetes, obesity presenting for chronic lower extremity edema with associated discomfort to right ankle and foot for the last week.  Symptoms worsened by prolonged standing, sitting.  Patient does not have compression socks, nor has she seen vascular.  Patient feels that her lower extremity edema is stable, consistent with baseline without change in color/sensation.  Patient states she is tried propping her legs up, though does not come to above hip level when doing so.  States she tried Tylenol one time with minimal relief.  She denies trauma to the area, known bite, ulcer.  Patient does not smoke, is not on hormone therapy, nor has she had history of DVT. During exam, provider notes bedbug crawling across patient's chest.  Patient admits to having bedbugs at home: Is "getting this worked" and has had a few bug bites over the course of infestation.  None currently, though states hydrocortisone cream did not help with previous bites.  Past Medical History:  Diagnosis Date   Allergic rhinitis    Anemia    Asthma    Chest pain    LEFT SIDED-SEVERAL YEARS   Diabetes mellitus, type 2 (HCC)    GERD (gastroesophageal reflux disease)    HTN (hypertension)    Hyperlipidemia    Vitamin D deficiency     Patient Active Problem List   Diagnosis Date Noted   Proximal leg weakness 08/12/2018   Diabetes mellitus, type 2 (HCC)    Chest pain    HTN (hypertension)    Asthma    GERD (gastroesophageal reflux disease)    Anemia     Past Surgical History:  Procedure Laterality Date   ENDOMETRIAL BIOPSY      OB History   No obstetric history on file.      Home Medications    Prior to Admission medications   Medication Sig  Start Date End Date Taking? Authorizing Provider  albuterol (PROVENTIL HFA;VENTOLIN HFA) 108 (90 Base) MCG/ACT inhaler Inhale 1-2 puffs into the lungs every 6 (six) hours as needed for wheezing or shortness of breath.    [provider]  amLODipine (NORVASC) 10 MG tablet Take 1 tablet (10 mg total) by mouth daily. 02/11/14   Pamella Pert, MD  aspirin EC 81 MG tablet Take 81 mg by mouth daily.    [provider]  benazepril-hydrochlorthiazide (LOTENSIN HCT) 20-25 MG tablet Take 1 tablet by mouth daily. 07/15/18   [provider]  cetirizine (ZYRTEC) 10 MG tablet Take 10 mg by mouth daily.      [provider]  fluticasone (FLONASE) 50 MCG/ACT nasal spray Place 1 spray into both nostrils daily.    [provider]  gabapentin (NEURONTIN) 100 MG capsule Take 100 mg by mouth 3 (three) times daily.    [provider]  glimepiride (AMARYL) 4 MG tablet Take 1 tablet (4 mg total) by mouth daily with breakfast. Patient taking differently: Take 8 mg by mouth daily with breakfast.  02/11/14   Pamella Pert, MD  hydrOXYzine (VISTARIL) 25 MG capsule Take 25-50 mg by mouth at bedtime as needed for itching.    [provider]  meclizine (ANTIVERT) 25 MG tablet Take 1 tablet (25 mg total) by  mouth 3 (three) times daily as needed for dizziness. 02/10/16   Charlesetta Shanks, MD  metFORMIN (GLUCOPHAGE-XR) 500 MG 24 hr tablet Take 1,500 mg by mouth daily with breakfast.    [provider]  Multiple Vitamins-Iron (DAILY MULTIVITAMINS/IRON PO) Take 1 tablet by mouth daily.     [provider]  naproxen (NAPROSYN) 375 MG tablet Take 1 tablet (375 mg total) by mouth 2 (two) times daily. 04/11/19   Wieters, Hallie C, PA-C  oxymetazoline (NASAL DECONGESTANT SPRAY) 0.05 % nasal spray Place 1 spray into both nostrils daily as needed for congestion.     [provider]  permethrin (ELIMITE) 5 % cream Apply to affected area once 09/23/15    Hyman Bible, PA-C  sitaGLIPtin (JANUVIA) 100 MG tablet Take 100 mg by mouth daily.      [provider]  triamcinolone ointment (KENALOG) 0.5 % Apply 1 application topically 2 (two) times daily. 07/06/19   Hall-Potvin, [place], PA-C  diphenhydrAMINE (BENADRYL) 25 MG tablet Take 50 mg by mouth every 6 (six) hours as needed for allergies.  04/05/19  [provider]    Family History Family History  Problem Relation Age of Onset   Hypertension Father    Glaucoma Father    Hypertension Mother    Diabetes type II Mother    Multiple sclerosis Sister    Breast cancer Neg Hx     Social History Social History   Tobacco Use   Smoking status: Never Smoker   Smokeless tobacco: Never Used  Substance Use Topics   Alcohol use: No   Drug use: Never     Allergies   Statins   Review of Systems Review of Systems  Constitutional: Negative for fatigue and fever.  Respiratory: Negative for cough and shortness of breath.   Cardiovascular: Negative for chest pain and palpitations.  Musculoskeletal:       Positive for bilateral lower extremity edema, right ankle/foot pain  Neurological: Negative for weakness and numbness.     Physical Exam Triage Vital Signs ED Triage Vitals  Enc Vitals Group     BP 07/06/19 1721 138/65     Pulse Rate 07/06/19 1721 (!) 103     Resp 07/06/19 1721 20     Temp 07/06/19 1721 98.2 F (36.8 C)     Temp Source 07/06/19 1721 Oral     SpO2 07/06/19 1721 98 %     Weight --      Height --      Head Circumference --      Peak Flow --      Pain Score 07/06/19 1722 5     Pain Loc --      Pain Edu? --      Excl. in [place]? --    No data found.  Updated Vital Signs BP 138/65 (BP Location: Left Arm)    Pulse (!) 103    Temp 98.2 F (36.8 C) (Oral)    Resp 20    SpO2 98%   Visual Acuity Right Eye Distance:   Left Eye Distance:   Bilateral Distance:    Right Eye Near:   Left Eye Near:    Bilateral Near:     Physical  Exam Constitutional:      General: She is not in acute distress.    Appearance: She is obese. She is not ill-appearing or diaphoretic.  HENT:     Head: Normocephalic and atraumatic.     Mouth/Throat:  Mouth: Mucous membranes are moist.     Pharynx: Oropharynx is clear.  Eyes:     General: No scleral icterus.    Pupils: Pupils are equal, round, and reactive to light.  Neck:     Musculoskeletal: Neck supple. No muscular tenderness.     Comments: Trachea midline, negative JVD Cardiovascular:     Rate and Rhythm: Regular rhythm. Tachycardia present.     Comments: HR 94-106 w/ provider Pulmonary:     Effort: Pulmonary effort is normal. No respiratory distress.     Breath sounds: No wheezing.  Musculoskeletal:     Right lower leg: Edema present.     Left lower leg: Edema present.     Comments: Mild to moderate nonpitting edema noted to knees bilaterally.  Bilateral feet are edematous as well without pitting.  Calves are symmetric in size: No cords palpated, negative Homans sign.  Negative squeeze test bilaterally.  No malleoli tenderness bilaterally.  Patient has full passive ROM without pain.  PT and DP pulses difficult to appreciate second to edema, though distal extremities are warm, blanch.  Lymphadenopathy:     Cervical: No cervical adenopathy.  Skin:    Capillary Refill: Capillary refill takes less than 2 seconds.     Coloration: Skin is not jaundiced.     Findings: No bruising or rash.     Comments: Chronic lower venous stasis noted without bright erythema, warmth.  No open wounds, ulcers of lower extremities, feet, in between toes.  Neurological:     General: No focal deficit present.     Mental Status: She is alert and oriented to person, place, and time.     Sensory: No sensory deficit.     Motor: No weakness.     Deep Tendon Reflexes: Reflexes normal.      UC Treatments / Results  Labs (all labs ordered are listed, but only abnormal results are displayed) Labs  Reviewed - No data to display  EKG   Radiology No results found.  Procedures Procedures (including critical care time)  Medications Ordered in UC Medications - No data to display  Initial Impression / Assessment and Plan / UC Course  I have reviewed the triage vital signs and the nursing notes.  Pertinent labs & imaging results that were available during my care of the patient were reviewed by me and considered in my medical decision making (see chart for details).     1.  Bedbugs We will provide triamcinolone for future bites as patient currently has infestation at home.  Patient has an exterminator helping her with this issue.  2.  Bilateral lower extremity edema No inciting event, trauma: Radiography deferred.  Was criteria 0 based on chronicity, lack of inciting event, bilateral, even distribution of distal lower extremity edema.  Patient to work on wearing compression socks, Epson salt soaks to help of fluid.  Low concern for acute heart failure at this time given exam findings/vitals in conjunction with chronicity of issue.  Patient follow-up with PCP for further eval/management if needed.  Return precautions discussed, patient verbalized understanding and is agreeable to plan. Final Clinical Impressions(s) / UC Diagnoses   Final diagnoses:  Infestation by bed bug  Bilateral lower extremity edema     Discharge Instructions     Important to wear TED hose, compression socks every day. May do Epson salt soaks to help with edema. Do ankle exercises attached. Follow-up with primary care for further work-up/evaluation of possible diabetic neuropathy.  ED Prescriptions    Medication Sig Dispense Auth. Provider   triamcinolone ointment (KENALOG) 0.5 % Apply 1 application topically 2 (two) times daily. 30 g Hall-Potvin, [place], PA-C     PDMP not reviewed this encounter.   Hall-Potvin, [place], [place] 07/08/19 1741

## 2019-07-06 NOTE — ED Triage Notes (Signed)
Pt c/o swelling in both legs with pain to rt ankle and foot area x1wk

## 2019-09-07 ENCOUNTER — Other Ambulatory Visit: Payer: Self-pay

## 2019-09-07 ENCOUNTER — Ambulatory Visit (INDEPENDENT_AMBULATORY_CARE_PROVIDER_SITE_OTHER): Payer: BC Managed Care – PPO

## 2019-09-07 ENCOUNTER — Encounter: Payer: Self-pay | Admitting: Podiatry

## 2019-09-07 ENCOUNTER — Other Ambulatory Visit: Payer: Self-pay | Admitting: Podiatry

## 2019-09-07 ENCOUNTER — Ambulatory Visit: Payer: BC Managed Care – PPO | Admitting: Podiatry

## 2019-09-07 DIAGNOSIS — M7989 Other specified soft tissue disorders: Secondary | ICD-10-CM

## 2019-09-07 DIAGNOSIS — M79671 Pain in right foot: Secondary | ICD-10-CM

## 2019-09-07 DIAGNOSIS — I872 Venous insufficiency (chronic) (peripheral): Secondary | ICD-10-CM

## 2019-09-07 DIAGNOSIS — M25571 Pain in right ankle and joints of right foot: Secondary | ICD-10-CM

## 2019-09-07 DIAGNOSIS — M25572 Pain in left ankle and joints of left foot: Secondary | ICD-10-CM | POA: Diagnosis not present

## 2019-09-07 DIAGNOSIS — M79672 Pain in left foot: Secondary | ICD-10-CM | POA: Diagnosis not present

## 2019-09-07 MED ORDER — GABAPENTIN 100 MG PO CAPS: 100.0000 mg | ORAL_CAPSULE | Freq: Every day | ORAL | 3 refills | Status: DC

## 2019-09-07 NOTE — Progress Notes (Signed)
Subjective:  Patient ID: Kristine Bauer, female    DOB: 1965-12-26,  MRN: [id number] HPI Chief Complaint  Patient presents with  . Foot Pain    bilateral   . Skin Discoloration    bilateral feet   . Edema    bilateral legs   . Eczema    bilateral plantar feet     53 y.o. female presents with the above complaint.   ROS: Denies fever chills nausea vomiting muscle aches pains calf pain back pain chest pain shortness of breath.  Past Medical History:  Diagnosis Date  . Allergic rhinitis   . Anemia   . Asthma   . Chest pain    LEFT SIDED-SEVERAL YEARS  . Diabetes mellitus, type 2 ([place])   . GERD (gastroesophageal reflux disease)   . HTN (hypertension)   . Hyperlipidemia   . Vitamin D deficiency    Past Surgical History:  Procedure Laterality Date  . ENDOMETRIAL BIOPSY      Current Outpatient Medications:  .  albuterol (PROVENTIL HFA;VENTOLIN HFA) 108 (90 Base) MCG/ACT inhaler, Inhale 1-2 puffs into the lungs every 6 (six) hours as needed for wheezing or shortness of breath., Disp: , Rfl:  .  amLODipine (NORVASC) 10 MG tablet, Take 1 tablet (10 mg total) by mouth daily., Disp: 30 tablet, Rfl: 0 .  aspirin EC 81 MG tablet, Take 81 mg by mouth daily., Disp: , Rfl:  .  benazepril (LOTENSIN) 20 MG tablet, Take 20 mg by mouth daily., Disp: , Rfl:  .  benazepril-hydrochlorthiazide (LOTENSIN HCT) 20-25 MG tablet, Take 1 tablet by mouth daily., Disp: , Rfl: 0 .  cetirizine (ZYRTEC) 10 MG tablet, Take 10 mg by mouth daily.  , Disp: , Rfl:  .  Cholecalciferol (VITAMIN D) 50 MCG (2000 UT) tablet, Take 2,000 Units by mouth daily., Disp: , Rfl:  .  fluticasone (FLONASE) 50 MCG/ACT nasal spray, Place 1 spray into both nostrils daily., Disp: , Rfl:  .  gabapentin (NEURONTIN) 100 MG capsule, Take 100 mg by mouth 3 (three) times daily., Disp: , Rfl:  .  gabapentin (NEURONTIN) 100 MG capsule, Take 1 capsule (100 mg total) by mouth at bedtime., Disp: 30 capsule, Rfl: 3 .  glimepiride  (AMARYL) 4 MG tablet, Take 1 tablet (4 mg total) by mouth daily with breakfast. (Patient taking differently: Take 8 mg by mouth daily with breakfast. ), Disp: 30 tablet, Rfl: 0 .  hydrochlorothiazide (HYDRODIURIL) 25 MG tablet, Take 25 mg by mouth daily., Disp: , Rfl:  .  hydrOXYzine (VISTARIL) 25 MG capsule, Take 25-50 mg by mouth at bedtime as needed for itching., Disp: , Rfl:  .  meclizine (ANTIVERT) 25 MG tablet, Take 1 tablet (25 mg total) by mouth 3 (three) times daily as needed for dizziness., Disp: 30 tablet, Rfl: 0 .  metFORMIN (GLUCOPHAGE-XR) 500 MG 24 hr tablet, Take 1,500 mg by mouth daily with breakfast., Disp: , Rfl:  .  montelukast (SINGULAIR) 10 MG tablet, Take 10 mg by mouth at bedtime., Disp: , Rfl:  .  Multiple Vitamins-Iron (DAILY MULTIVITAMINS/IRON PO), Take 1 tablet by mouth daily. , Disp: , Rfl:  .  naproxen (NAPROSYN) 375 MG tablet, Take 1 tablet (375 mg total) by mouth 2 (two) times daily., Disp: 20 tablet, Rfl: 0 .  oxymetazoline (NASAL DECONGESTANT SPRAY) 0.05 % nasal spray, Place 1 spray into both nostrils daily as needed for congestion. , Disp: , Rfl:  .  pantoprazole (PROTONIX) 40 MG tablet, Take 40 mg  by mouth daily., Disp: , Rfl:  .  permethrin (ELIMITE) 5 % cream, Apply to affected area once, Disp: 60 g, Rfl: 0 .  sitaGLIPtin (JANUVIA) 100 MG tablet, Take 100 mg by mouth daily.  , Disp: , Rfl:  .  triamcinolone ointment (KENALOG) 0.5 %, Apply 1 application topically 2 (two) times daily., Disp: 30 g, Rfl: 0  Allergies  Allergen Reactions  . Statins Other (See Comments)    Myopathy   Review of Systems Objective:  There were no vitals filed for this visit.  General: Well developed, nourished, in no acute distress, alert and oriented x3   Dermatological: Skin is warm, dry and supple bilateral. Nails x 10 are well maintained; remaining integument appears unremarkable at this time. There are no open sores, no preulcerative lesions, no rash or signs of infection  present.  Vascular: Dorsalis Pedis artery and Posterior Tibial artery pedal pulses are 2/4 bilateral with immedate capillary fill time. Pedal hair growth present. No varicosities and no lower extremity edema present bilateral.   Neruologic: Grossly intact via light touch bilateral. Vibratory intact via tuning fork bilateral. Protective threshold with Semmes Wienstein monofilament intact to all pedal sites bilateral. Patellar and Achilles deep tendon reflexes 2+ bilateral. No Babinski or clonus noted bilateral.   Musculoskeletal: No gross boney pedal deformities bilateral. No pain, crepitus, or limitation noted with foot and ankle range of motion bilateral. Muscular strength 5/5 in all groups tested bilateral.  Swelling soft tissue swelling to the forefoot.  Pes planus is noted.  She also has pitting edema of the legs with stasis dermatitis medial aspect.  F2.  Gait: Unassisted, Nonantalgic.    Radiographs:  Radiographs were not taken today.  Assessment & Plan:   Assessment: Stasis dermatitis venous stasis bilateral lower extremity with pes planus.  Plan: Darco shoes and Unna boots were placed today bilaterally.  Follow-up with her in 1 week to remove the Unna boots and we are also requesting vascular evaluation for venous insufficiency.     Rudolf Blizard T. [place], [place]

## 2019-09-11 ENCOUNTER — Telehealth: Payer: Self-pay | Admitting: *Deleted

## 2019-09-11 NOTE — Telephone Encounter (Signed)
Pt asked when to take the bandages off they were making her feet numb. I reviewed pt's clinical 09/07/2019 and unna boots were applied. I instructed pt she could remove the unna boots and this time, rest and elevate and if numbness continued contact me again.

## 2019-09-12 ENCOUNTER — Other Ambulatory Visit: Payer: Self-pay

## 2019-09-12 ENCOUNTER — Ambulatory Visit (HOSPITAL_COMMUNITY)
Admission: RE | Admit: 2019-09-12 | Discharge: 2019-09-12 | Disposition: A | Discharge status: Home / Self Care | Payer: 59 | Source: Ambulatory Visit | Attending: Podiatry | Admitting: Podiatry

## 2019-09-12 DIAGNOSIS — I872 Venous insufficiency (chronic) (peripheral): Secondary | ICD-10-CM | POA: Diagnosis not present

## 2019-09-14 ENCOUNTER — Other Ambulatory Visit: Payer: Self-pay

## 2019-09-14 ENCOUNTER — Ambulatory Visit (INDEPENDENT_AMBULATORY_CARE_PROVIDER_SITE_OTHER): Payer: 59 | Admitting: Podiatry

## 2019-09-14 ENCOUNTER — Encounter: Payer: Self-pay | Admitting: Podiatry

## 2019-09-14 DIAGNOSIS — M7751 Other enthesopathy of right foot: Secondary | ICD-10-CM | POA: Diagnosis not present

## 2019-09-14 DIAGNOSIS — M7752 Other enthesopathy of left foot: Secondary | ICD-10-CM | POA: Diagnosis not present

## 2019-09-14 DIAGNOSIS — M7989 Other specified soft tissue disorders: Secondary | ICD-10-CM | POA: Diagnosis not present

## 2019-09-14 DIAGNOSIS — M7750 Other enthesopathy of unspecified foot: Secondary | ICD-10-CM

## 2019-09-14 MED ORDER — GABAPENTIN 300 MG PO CAPS: 300.0000 mg | ORAL_CAPSULE | Freq: Three times a day (TID) | ORAL | 3 refills | Status: DC

## 2019-09-14 NOTE — Progress Notes (Signed)
She presents today for follow-up of her bilateral lower extremity edema.  Vascular reports demonstrated that there were no blockages.  She states this seems to be some better but she is having pain right in here she points to sinus tarsi bilaterally still complaining of tight calves and tight legs.  States that the gabapentin helps some at nighttime but not throughout the day.  Does not check her blood sugar regularly and will be going to her primary care provider the end of this month.  This will be a new primary care provider has not been doing that for physical in several years.  Objective: Vital signs are stable she is alert and oriented x3.  Pulses are palpable.  Neurologic sensorium is intact her legs are tight and swollen they are not warm to the touch pulses are palpable.  She has pitting edema across the dorsum of the foot.  She has pain on palpation of the sinus tarsi and on end range of motion of the ankle joints.  Assessment: Subtalar joint capsulitis bilateral edema possibly associated with kidney or liver issues does not appear to be venous insufficiency.  Subtalar joint stiffness.  Lan: After sterile Betadine skin prep I injected 20 mg Kenalog 5 mg Marcaine subtalar joints bilaterally.  Placed her back in [place] she will go back to work next Wednesday.  We will follow-up with her when she is completed her follow-up with her primary care doctor.

## 2019-09-27 ENCOUNTER — Telehealth: Payer: Self-pay

## 2019-09-27 NOTE — Telephone Encounter (Signed)
Called patient to do their pre-visit COVID screening.  Call went to voicemail. Unable to do prescreening.  

## 2019-09-28 ENCOUNTER — Ambulatory Visit: Payer: 59

## 2019-09-28 ENCOUNTER — Telehealth: Payer: Self-pay | Admitting: Emergency Medicine

## 2019-09-28 ENCOUNTER — Encounter: Payer: Self-pay | Admitting: Emergency Medicine

## 2019-09-28 ENCOUNTER — Ambulatory Visit
Admission: EM | Admit: 2019-09-28 | Discharge: 2019-09-28 | Disposition: A | Discharge status: Home / Self Care | Payer: 59 | Attending: Emergency Medicine | Admitting: Emergency Medicine

## 2019-09-28 ENCOUNTER — Other Ambulatory Visit: Payer: Self-pay

## 2019-09-28 DIAGNOSIS — Z20822 Contact with and (suspected) exposure to covid-19: Secondary | ICD-10-CM | POA: Diagnosis not present

## 2019-09-28 NOTE — ED Triage Notes (Signed)
Pt presents to [facility] for assessment after non-direct exposure to COVID at work (several people tested positive), and was refused at her PCP until COVID testing completed.  Patient denies any symptoms at this time.

## 2019-09-28 NOTE — Progress Notes (Deleted)
   Subjective:    Patient ID: Kristine Bauer, female    DOB: 10-30-1965, 54 y.o.   MRN: [id number]  53 y.o.F hx of HTN, T2DM, obesity, asthma, allergic rhinitis, anemia, GERD  Here to establish care  has multiple care gaps.  Foot/pna vac, tdap, a1c, colonoscopy, pap smear.    Due mammos in 12/21         Review of Systems     Objective:   Physical Exam        Assessment & Plan:

## 2019-09-28 NOTE — Telephone Encounter (Signed)
Patient called [facility] for refill on HCTZ after being seen earlier today for COVID test.  This RN spoke to APP and RT on site today who state patient access staff from PCP aware of patient's request for follow-up and has tele-visit on Tuesday of next week.  This RN will copy PCP staff on this telephone encounter, and encouraged patient to call PCP office tomorrow if not heard from them by 9am.

## 2019-09-28 NOTE — Discharge Instructions (Signed)
Your COVID test is pending - it is important to quarantine / isolate at home until your results are back. °If you test positive and would like further evaluation for persistent or worsening symptoms, you may schedule an E-visit or virtual (video) visit throughout the [facility] MyChart app or website. ° °PLEASE NOTE: If you develop severe chest pain or shortness of breath please go to the ER or call 9-1-1 for further evaluation --> DO NOT schedule electronic or virtual visits for this. °Please call our office for further guidance / recommendations as needed. ° °For information about the Covid vaccine, please visit [email or web address] °

## 2019-09-28 NOTE — ED Provider Notes (Signed)
[facility]    CSN: [id number] Arrival date & time: 09/28/19  1556      History   Chief Complaint Chief Complaint  Patient presents with  . Exposure to COVID    HPI Kristine Bauer is a 54 y.o. female with history of hypertension, obesity, diabetes, asthma  Presenting for Covid testing: Exposure: multiple coworkers Date of exposure: Daily throughout work week, last known Wednesday of last week. Any fever, symptoms since exposure: none.  States PCP is requesting testing prior to being seen for routine care.   Past Medical History:  Diagnosis Date  . Allergic rhinitis   . Anemia   . Asthma   . Chest pain    LEFT SIDED-SEVERAL YEARS  . Diabetes mellitus, type 2 ([place])   . GERD (gastroesophageal reflux disease)   . HTN (hypertension)   . Hyperlipidemia   . Vitamin D deficiency     Patient Active Problem List   Diagnosis Date Noted  . Proximal leg weakness 08/12/2018  . ETD (Eustachian tube dysfunction), bilateral 04/20/2017  . Nasal turbinate hypertrophy 04/20/2017  . Seasonal allergic rhinitis 04/20/2017  . Diabetes mellitus, type 2 ([place])   . Chest pain   . HTN (hypertension)   . Asthma   . GERD (gastroesophageal reflux disease)   . Anemia     Past Surgical History:  Procedure Laterality Date  . ENDOMETRIAL BIOPSY      OB History   No obstetric history on file.      Home Medications    Prior to Admission medications   Medication Sig Start Date End Date Taking? Authorizing Provider  aspirin EC 81 MG tablet Take 81 mg by mouth daily.    [provider]  benazepril (LOTENSIN) 20 MG tablet Take 20 mg by mouth daily. 08/28/19   [provider]  Cholecalciferol (VITAMIN D) 50 MCG (2000 UT) tablet Take 2,000 Units by mouth daily. 08/22/19   [provider]  gabapentin (NEURONTIN) 300 MG capsule Take 1 capsule (300 mg total) by mouth 3 (three) times daily. 09/14/19   Hyatt, Max T, DPM  hydrochlorothiazide  (HYDRODIURIL) 25 MG tablet Take 25 mg by mouth daily. 08/22/19   [provider]  metFORMIN (GLUCOPHAGE-XR) 500 MG 24 hr tablet Take 1,500 mg by mouth daily with breakfast.    [provider]  montelukast (SINGULAIR) 10 MG tablet Take 10 mg by mouth at bedtime. 08/22/19   [provider]  Multiple Vitamins-Iron (DAILY MULTIVITAMINS/IRON PO) Take 1 tablet by mouth daily.     [provider]  pantoprazole (PROTONIX) 40 MG tablet Take 40 mg by mouth daily. 09/04/19   [provider]  sitaGLIPtin (JANUVIA) 100 MG tablet Take 100 mg by mouth daily.      [provider]  diphenhydrAMINE (BENADRYL) 25 MG tablet Take 50 mg by mouth every 6 (six) hours as needed for allergies.  04/05/19  [provider]    Family History Family History  Problem Relation Age of Onset  . Hypertension Father   . Glaucoma Father   . Hypertension Mother   . Diabetes type II Mother   . Multiple sclerosis Sister   . Breast cancer Neg Hx     Social History Social History   Tobacco Use  . Smoking status: Never Smoker  . Smokeless tobacco: Never Used  Substance Use Topics  . Alcohol use: No  . Drug use: Never     Allergies   Statins   Review  of Systems Review of Systems  Constitutional: Negative for fatigue and fever.  HENT: Negative for ear pain, sinus pain, sore throat and voice change.   Eyes: Negative for pain, redness and visual disturbance.  Respiratory: Negative for cough and shortness of breath.   Cardiovascular: Negative for chest pain and palpitations.  Gastrointestinal: Negative for abdominal pain, diarrhea and vomiting.  Musculoskeletal: Negative for arthralgias and myalgias.  Skin: Negative for rash and wound.  Neurological: Negative for syncope and headaches.     Physical Exam Triage Vital Signs ED Triage Vitals  Enc Vitals Group     BP      Pulse      Resp      Temp      Temp src      SpO2      Weight      Height        Head Circumference      Peak Flow      Pain Score      Pain Loc      Pain Edu?      Excl. in [place]?    No data found.  Updated Vital Signs BP 132/82 (BP Location: Right Arm)   Pulse 91   Temp 98 F (36.7 C) (Temporal)   Resp 16   SpO2 95%   Visual Acuity Right Eye Distance:   Left Eye Distance:   Bilateral Distance:    Right Eye Near:   Left Eye Near:    Bilateral Near:     Physical Exam Constitutional:      General: She is not in acute distress. HENT:     Head: Normocephalic and atraumatic.  Eyes:     General: No scleral icterus.    Pupils: Pupils are equal, round, and reactive to light.  Cardiovascular:     Rate and Rhythm: Normal rate.  Pulmonary:     Effort: Pulmonary effort is normal. No respiratory distress.     Breath sounds: No wheezing.  Skin:    Coloration: Skin is not jaundiced or pale.  Neurological:     Mental Status: She is alert and oriented to person, place, and time.      UC Treatments / Results  Labs (all labs ordered are listed, but only abnormal results are displayed) Labs Reviewed  NOVEL CORONAVIRUS, NAA    EKG   Radiology No results found.  Procedures Procedures (including critical care time)  Medications Ordered in UC Medications - No data to display  Initial Impression / Assessment and Plan / UC Course  I have reviewed the triage vital signs and the nursing notes.  Pertinent labs & imaging results that were available during my care of the patient were reviewed by me and considered in my medical decision making (see chart for details).     Patient afebrile, nontoxic, with SpO2 95%.  Covid PCR pending.  Patient to quarantine until results are back.  We will continue supportive management.  Return precautions discussed, patient verbalized understanding and is agreeable to plan. Final Clinical Impressions(s) / UC Diagnoses   Final diagnoses:  Exposure to COVID-19 virus     Discharge Instructions     Your COVID  test is pending - it is important to quarantine / isolate at home until your results are back. If you test positive and would like further evaluation for persistent or worsening symptoms, you may schedule an E-visit or virtual (video) visit throughout the [facility] app or website.  PLEASE  NOTE: If you develop severe chest pain or shortness of breath please go to the ER or call 9-1-1 for further evaluation --> DO NOT schedule electronic or virtual visits for this. Please call our office for further guidance / recommendations as needed.  For information about the Covid vaccine, please visit [email or web address]    ED Prescriptions    None     PDMP not reviewed this encounter.   [place], [place], [place] 09/28/19 [phone]

## 2019-09-29 ENCOUNTER — Telehealth: Payer: Self-pay | Admitting: Family Medicine

## 2019-09-29 LAB — NOVEL CORONAVIRUS, NAA: SARS-CoV-2, NAA: NOT DETECTED

## 2019-09-29 NOTE — Telephone Encounter (Signed)
1) Medication(s) Requested (by name): glimepiride (AMARYL) 4 MG tablet [id number]   2) Pharmacy of Choice:  [id number] - [place], [address] [place] [address]    Approved medications will be sent to pharmacy, we will reach out to you if there is an issue.  Requests made after 3pm may not be addressed until following business day!

## 2019-09-29 NOTE — Telephone Encounter (Signed)
Until patient has established at this office, we are unable to right any prescriptions for her. When patient came in & stated that she had COVID exposure she was asked if she would like to turn her visit into a phone visit. She declined.

## 2019-09-29 NOTE — Telephone Encounter (Signed)
Until patient has established at this office, we are unable to right any prescriptions for her. When patient came in & stated that she had COVID exposure she was asked if she would like to turn her visit into a phone visit.  She will need to call previous PCP to get prescription filled.

## 2019-10-03 ENCOUNTER — Ambulatory Visit (INDEPENDENT_AMBULATORY_CARE_PROVIDER_SITE_OTHER): Payer: 59 | Admitting: Internal Medicine

## 2019-10-03 ENCOUNTER — Encounter: Payer: Self-pay | Admitting: *Deleted

## 2019-10-03 ENCOUNTER — Other Ambulatory Visit: Payer: Self-pay

## 2019-10-03 ENCOUNTER — Encounter: Payer: Self-pay | Admitting: Internal Medicine

## 2019-10-03 VITALS — BP 130/80 | HR 90 | Temp 97.5°F | Resp 18 | Ht 64.0 in | Wt 220.0 lb

## 2019-10-03 DIAGNOSIS — Z7689 Persons encountering health services in other specified circumstances: Secondary | ICD-10-CM

## 2019-10-03 DIAGNOSIS — R0789 Other chest pain: Secondary | ICD-10-CM | POA: Diagnosis not present

## 2019-10-03 DIAGNOSIS — E119 Type 2 diabetes mellitus without complications: Secondary | ICD-10-CM

## 2019-10-03 DIAGNOSIS — R600 Localized edema: Secondary | ICD-10-CM

## 2019-10-03 DIAGNOSIS — R079 Chest pain, unspecified: Secondary | ICD-10-CM

## 2019-10-03 DIAGNOSIS — I10 Essential (primary) hypertension: Secondary | ICD-10-CM | POA: Diagnosis not present

## 2019-10-03 DIAGNOSIS — Z13228 Encounter for screening for other metabolic disorders: Secondary | ICD-10-CM

## 2019-10-03 NOTE — Progress Notes (Signed)
Subjective:    Kristine Bauer - 54 y.o. female MRN [id number]  Date of birth: 1966-07-17  HPI  Kristine Bauer is to establish care. Patient has a PMH significant for HTN, T2DM, asthma.   Chest Pain: Reports squeezing type of chest pain under left breast. About a week ago it was occurring every 5-6 minutes. Last night she felt it once. Does not radiate. Occurs both with rest and exertion. No history of MI. Not currently experiencing chest pain. Denies associated diaphoresis, dyspnea, and nausea/vomiting.  Leg Edema: Reports bilateral LE edema for several months, left >right. Has been seen by podiatry. Had vascular studies done that showed no evidence of DVT. Edema is not painful. Had compression wraps in place for a time that helped with edema. No known history of CHF.   Chronic HTN Medications: HCTZ 25 mg qd, Benazepril 20 mg qd, Amlodipine 10 mg  Compliance- yes   Diabetes mellitus, Type 2 Last A1C: 8.5 (2013)  Medications: Metformin 1500 mg daily, Januvia 100 mg daily  Medication Compliance: yes    Social History   reports that she has never smoked. She has never used smokeless tobacco. She reports that she does not drink alcohol or use drugs.   Family History  family history includes Diabetes type II in her mother; Glaucoma in her father; Hypertension in her father and mother; Multiple sclerosis in her sister.    Health Maintenance Due  Topic Date Due  . PNEUMOCOCCAL POLYSACCHARIDE VACCINE AGE 51-64 HIGH RISK  10/21/1966  . FOOT EXAM  10/21/1974  . OPHTHALMOLOGY EXAM  10/21/1974  . TETANUS/TDAP  10/22/1983  . PAP SMEAR-Modifier  10/21/1985  . HEMOGLOBIN A1C  01/29/2013  . COLONOSCOPY  10/21/2014     Past Medical History: Patient Active Problem List   Diagnosis Date Noted  . Proximal leg weakness 08/12/2018  . ETD (Eustachian tube dysfunction), bilateral 04/20/2017  . Nasal turbinate hypertrophy 04/20/2017  . Seasonal allergic rhinitis 04/20/2017  .  Diabetes mellitus, type 2 ([place])   . Chest pain   . HTN (hypertension)   . Asthma   . GERD (gastroesophageal reflux disease)   . Anemia     Medications: reviewed and updated   Objective:   Physical Exam BP 130/80 (BP Location: Right Arm, Patient Position: Sitting, Cuff Size: Large)   Pulse 90   Temp (!) 97.5 F (36.4 C) (Oral)   Resp 18   Ht 5\' 4"  (1.626 m)   Wt 220 lb (99.8 kg)   LMP 03/07/2019   SpO2 96%   BMI 37.76 kg/m  Physical Exam  Constitutional: She is oriented to person, place, and time and well-developed, well-nourished, and in no distress. No distress.  HENT:  Head: Normocephalic and atraumatic.  Eyes: Conjunctivae and EOM are normal.  Cardiovascular: Normal rate, regular rhythm, normal heart sounds and intact distal pulses.  No murmur heard. 2+ pitting edema to LE bilaterally. Left leg appears slightly larger than right but not significantly. Negative Homan's sign bilaterally. No calf tenderness.   Pulmonary/Chest: Effort normal and breath sounds normal. No respiratory distress.  Musculoskeletal:        General: Normal range of motion.  Neurological: She is alert and oriented to person, place, and time.  Skin: Skin is warm and dry. She is not diaphoretic.  Psychiatric: Affect and judgment normal.        Assessment & Plan:    1. Encounter to establish care   2. Essential hypertension At goal today.  -  amLODipine (NORVASC) 10 MG tablet; Take 10 mg by mouth daily. - Basic metabolic panel  3. Type 2 diabetes mellitus without complication, unspecified whether long term insulin use (HCC) - glimepiride (AMARYL) 4 MG tablet; Take 4 mg by mouth 2 (two) times daily. - Lancets (ACCU-CHEK MULTICLIX) lancets; 1 each by Other route 3 (three) times daily. - Hemoglobin A1c - Ambulatory referral to Ophthalmology  4. Chest pain, unspecified type Concern for angina given report of left sided intermittent squeezing chest pain. Given chest pain occurring for >1 week  and not currently present, lower suspicion for ACS. Will obtain STAT troponin. Instructed patient that if this abnormal she will need to go to ER immediately. EKG without acute ST segment elevation and appears unchanged from prior in July 2020. Will obtain echo and also refer to cardiology.  - EKG 12-Lead - Troponin I - Ambulatory referral to Cardiology - ECHOCARDIOGRAM COMPLETE; Future  5. Screening for metabolic disorder - Lipid panel  6. Leg edema Patient with skin color changes that look concerning for chronic venous stasis complaints. This may be contributing to her edema. Given additional report of chest pain and prior issues with dyspnea, will obtain echo. Recent lower extremity doppler negative for DVT. Will also check BMET to evaluate for kidney dysfunction. Patient returning in 1-2 weeks. Will plan for further lab work with CBC, TSH, hepatic function panel.     Phill Myron, D.O. 10/03/2019, 4:07 PM Primary Care at [facility]

## 2019-10-04 ENCOUNTER — Telehealth: Payer: Self-pay | Admitting: Family Medicine

## 2019-10-04 LAB — BASIC METABOLIC PANEL
BUN/Creatinine Ratio: 27 — ABNORMAL HIGH (ref 9–23)
BUN: 17 mg/dL (ref 6–24)
CO2: 26 mmol/L (ref 20–29)
Calcium: 9.8 mg/dL (ref 8.7–10.2)
Chloride: 99 mmol/L (ref 96–106)
Creatinine, Ser: 0.62 mg/dL (ref 0.57–1.00)
GFR calc Af Amer: 118 mL/min/{1.73_m2} (ref >59)
GFR calc non Af Amer: 103 mL/min/{1.73_m2} (ref >59)
Glucose: 307 mg/dL — ABNORMAL HIGH (ref 65–99)
Potassium: 4.4 mmol/L (ref 3.5–5.2)
Sodium: 137 mmol/L (ref 134–144)

## 2019-10-04 LAB — LIPID PANEL
Chol/HDL Ratio: 2.6 ratio (ref 0.0–4.4)
Cholesterol, Total: 188 mg/dL (ref 100–199)
HDL: 71 mg/dL (ref >39)
LDL Chol Calc (NIH): 105 mg/dL — ABNORMAL HIGH (ref 0–99)
Triglycerides: 66 mg/dL (ref 0–149)
VLDL Cholesterol Cal: 12 mg/dL (ref 5–40)

## 2019-10-04 LAB — TROPONIN I: Troponin I: <0.01 ng/mL (ref 0.00–0.04)

## 2019-10-04 LAB — HEMOGLOBIN A1C
Est. average glucose Bld gHb Est-mCnc: 289 mg/dL
Hgb A1c MFr Bld: 11.7 % — ABNORMAL HIGH (ref 4.8–5.6)

## 2019-10-04 NOTE — Telephone Encounter (Signed)
What type of pain is she complaining of?   Phill Myron, D.O. Primary Care at [facility]  10/04/2019, 11:45 AM

## 2019-10-04 NOTE — Telephone Encounter (Signed)
Ankle pain and left leg is sore pulsing pain.

## 2019-10-04 NOTE — Telephone Encounter (Signed)
Patient called in asking for pain medications

## 2019-10-04 NOTE — Telephone Encounter (Signed)
I would recommend tylenol and ibuprofen. I don't prescribe narcotic pain medications for patients.   Phill Myron, D.O. Primary Care at [facility]  10/04/2019, 2:27 PM

## 2019-10-09 ENCOUNTER — Other Ambulatory Visit: Payer: Self-pay | Admitting: Family Medicine

## 2019-10-09 DIAGNOSIS — E119 Type 2 diabetes mellitus without complications: Secondary | ICD-10-CM

## 2019-10-09 DIAGNOSIS — I10 Essential (primary) hypertension: Secondary | ICD-10-CM

## 2019-10-09 NOTE — Telephone Encounter (Signed)
These would be initial prescriptions under your name.  Did not drop down the Gabapentin because that is being prescribed by Podiatry

## 2019-10-09 NOTE — Telephone Encounter (Signed)
1) Medication(s) Requested (by name): cetirizine (ZYRTEC) 10 MG tablet [id number]  Cholecalciferol (VITAMIN D) 50 MCG (2000 UT) tablet [id number]  hydrochlorothiazide (HYDRODIURIL) 25 MG tablet [id number]  glimepiride (AMARYL) 4 MG tablet [id number]  amLODipine (NORVASC) 10 MG tablet [id number]  metFORMIN (GLUCOPHAGE-XR) 500 MG 24 hr tablet [id number]  benazepril (LOTENSIN) 20 MG tablet [id number]  pantoprazole (PROTONIX) 40 MG tablet [id number]  gabapentin (NEURONTIN) 300 MG capsule [id number]  montelukast (SINGULAIR) 10 MG tablet [id number]  hydrOXYzine (ATARAX/VISTARIL) 25 MG tablet [id number]  fluticasone (VERAMYST) 27.5 MCG/SPRAY nasal spray [id number]   2) Pharmacy of Choice:  [id number] - [place], [address] [place] [address]    Approved medications will be sent to pharmacy, we will reach out to you if there is an issue.  Requests made after 3pm may not be addressed until following business day!

## 2019-10-10 MED ORDER — FLUTICASONE FUROATE 27.5 MCG/SPRAY NA SUSP: 2.0000 | Freq: Every day | NASAL | 0 refills | Status: DC

## 2019-10-10 MED ORDER — BENAZEPRIL HCL 20 MG PO TABS: 20.0000 mg | ORAL_TABLET | Freq: Every day | ORAL | 0 refills | Status: DC

## 2019-10-10 MED ORDER — METFORMIN HCL ER 500 MG PO TB24: 1500.0000 mg | ORAL_TABLET | Freq: Every day | ORAL | 0 refills | Status: DC

## 2019-10-10 MED ORDER — AMLODIPINE BESYLATE 10 MG PO TABS: 10.0000 mg | ORAL_TABLET | Freq: Every day | ORAL | 0 refills | Status: DC

## 2019-10-10 MED ORDER — MONTELUKAST SODIUM 10 MG PO TABS: 10.0000 mg | ORAL_TABLET | Freq: Every day | ORAL | 0 refills | Status: DC

## 2019-10-10 MED ORDER — HYDROCHLOROTHIAZIDE 25 MG PO TABS: 25.0000 mg | ORAL_TABLET | Freq: Every day | ORAL | 0 refills | Status: DC

## 2019-10-10 MED ORDER — HYDROXYZINE HCL 25 MG PO TABS: 25.0000 mg | ORAL_TABLET | Freq: Every day | ORAL | 2 refills | Status: DC

## 2019-10-10 MED ORDER — PANTOPRAZOLE SODIUM 40 MG PO TBEC: 40.0000 mg | DELAYED_RELEASE_TABLET | Freq: Every day | ORAL | 0 refills | Status: DC

## 2019-10-10 MED ORDER — GLIMEPIRIDE 4 MG PO TABS: 4.0000 mg | ORAL_TABLET | Freq: Two times a day (BID) | ORAL | 0 refills | Status: DC

## 2019-10-16 ENCOUNTER — Other Ambulatory Visit: Payer: Self-pay

## 2019-10-16 ENCOUNTER — Ambulatory Visit (HOSPITAL_COMMUNITY): Payer: 59 | Attending: Cardiovascular Disease

## 2019-10-16 DIAGNOSIS — R079 Chest pain, unspecified: Secondary | ICD-10-CM | POA: Insufficient documentation

## 2019-10-17 ENCOUNTER — Ambulatory Visit (INDEPENDENT_AMBULATORY_CARE_PROVIDER_SITE_OTHER): Payer: 59 | Admitting: Podiatry

## 2019-10-17 ENCOUNTER — Encounter: Payer: Self-pay | Admitting: Podiatry

## 2019-10-17 DIAGNOSIS — I872 Venous insufficiency (chronic) (peripheral): Secondary | ICD-10-CM | POA: Diagnosis not present

## 2019-10-17 DIAGNOSIS — M7989 Other specified soft tissue disorders: Secondary | ICD-10-CM | POA: Diagnosis not present

## 2019-10-17 NOTE — Progress Notes (Signed)
She presents today for follow-up of the edema bilateral lower extremities states that still has some of the itching but it feels better than it did after removing those Unna boots.  States that she had her cardiac echo and it feels much better.  Objective: Vital signs are stable alert and oriented x3 much edema decreased in the bilateral lower extremities.  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  Plan: Compression hose and I will follow-up with her in about a month

## 2019-11-08 ENCOUNTER — Encounter: Payer: Self-pay | Admitting: Cardiovascular Disease

## 2019-11-08 ENCOUNTER — Ambulatory Visit (INDEPENDENT_AMBULATORY_CARE_PROVIDER_SITE_OTHER): Payer: 59 | Admitting: Cardiovascular Disease

## 2019-11-08 ENCOUNTER — Other Ambulatory Visit: Payer: Self-pay

## 2019-11-08 VITALS — BP 132/84 | HR 96 | Ht 63.0 in | Wt 231.0 lb

## 2019-11-08 DIAGNOSIS — R600 Localized edema: Secondary | ICD-10-CM

## 2019-11-08 DIAGNOSIS — I10 Essential (primary) hypertension: Secondary | ICD-10-CM | POA: Diagnosis not present

## 2019-11-08 DIAGNOSIS — R079 Chest pain, unspecified: Secondary | ICD-10-CM | POA: Diagnosis not present

## 2019-11-08 MED ORDER — AMLODIPINE BESYLATE 5 MG PO TABS: 5.0000 mg | ORAL_TABLET | Freq: Every day | ORAL | 1 refills | Status: DC

## 2019-11-08 MED ORDER — VALSARTAN 160 MG PO TABS: 160.0000 mg | ORAL_TABLET | Freq: Every day | ORAL | 1 refills | Status: DC

## 2019-11-08 NOTE — Patient Instructions (Addendum)
Medication Instructions:   STOP TAKING BENAZEPRIL NOW  DECREASE YOUR AMLODIPINE TO 5 MG BY MOUTH DAILY  START TAKING VALSARTAN 160 MG BY MOUTH DAILY  *If you need a refill on your cardiac medications before your next appointment, please call your pharmacy*   Lab Work:  IN 3 WEEKS--TO CHECK A --BMET  If you have labs (blood work) drawn today and your tests are completely normal, you will receive your results only by: Marland Kitchen MyChart Message (if you have MyChart) OR . A paper copy in the mail If you have any lab test that is abnormal or we need to change your treatment, we will call you to review the results.   Follow-Up: At [facility], you and your health needs are our priority.  As part of our continuing mission to provide you with exceptional heart care, we have created designated Provider Care Teams.  These Care Teams include your primary Cardiologist (physician) and Advanced Practice Providers (APPs -  Physician Assistants and Nurse Practitioners) who all work together to provide you with the care you need, when you need it.  We recommend signing up for the patient portal called "MyChart".  Sign up information is provided on this After Visit Summary.  MyChart is used to connect with patients for Virtual Visits (Telemedicine).  Patients are able to view lab/test results, encounter notes, upcoming appointments, etc.  Non-urgent messages can be sent to your provider as well.   To learn more about what you can do with MyChart, go to [email or web address].     Your next appointment:   3 month(s)  The format for your next appointment:   WITH AN EXTENDER ON DR. Rishika Mccollom'S TEAM IN 3 MONTHS IN PERSON  Provider:   WITH AN EXTENDER ON DR. Lilyauna Miedema'S TEAM IN 3 MONTHS IN PERSON   For your  leg edema you  should do  the following 1. Leg elevation - I recommend the Lounge Dr. Leg rest.  See below for details  2. Salt restriction  -  Use potassium chloride instead of regular salt as a salt  substitute. 3. Walk regularly 4. Compression hose - [facility] 5. Weight loss    Available on [email or web address] Or  Go to [email or web address]

## 2019-11-08 NOTE — Progress Notes (Signed)
Cardiology Office Note:    Date:  11/08/2019   ID:  Kristine Bauer, DOB October 10, 1965, MRN [id number]  PCP:  Nicolette Bang, DO  Cardiologist:  Deshondra Worst  Electrophysiologist:  None   Referring MD: Caryl Never*   Chief Complaint  Patient presents with  . Chest Pain  . Hypertension    History of Present Illness:    Kristine Bauer is a 54 y.o. female with a hx of hypertension, diabetes mellitus.  We are asked to see her today by Dr. Juleen China for further evaluation of new onset of chest pain.  Has had some swelling in her legs for the past several months  Both legs, left > right .  She is not been making any effort to avoid salt and salty foods.  Eats bacon, country ham Not particularly short of breath. She is also on Norvasc.  Also has developed some cp. Mid sternal ,  Radiates down left arm . Not associated with exercise , not assoc with eating or drinkin g  She has had an echocardiogram which revealed normal left ventricular systolic function.  She has grade 1 diastolic dysfunction.  Right ventricular function is normal. Trivial mitral regurgitation  Lower extremity venous duplex scans reveal no evidence of DVT in either leg.  BP has been well controlled.  Is customer service at [employer]     Past Medical History:  Diagnosis Date  . Allergic rhinitis   . Anemia   . Asthma   . Chest pain    LEFT SIDED-SEVERAL YEARS  . Diabetes mellitus, type 2 ([place])   . GERD (gastroesophageal reflux disease)   . HTN (hypertension)   . Hyperlipidemia   . Vitamin D deficiency     Past Surgical History:  Procedure Laterality Date  . ENDOMETRIAL BIOPSY      Current Medications: Current Meds  Medication Sig  . aspirin EC 81 MG tablet Take 81 mg by mouth daily.  . Cholecalciferol (VITAMIN D) 50 MCG (2000 UT) tablet Take 2,000 Units by mouth daily.  . fluticasone (FLONASE) 50 MCG/ACT nasal spray Place 1 spray into both nostrils 2 (two) times daily.  Marland Kitchen  gabapentin (NEURONTIN) 300 MG capsule Take 1 capsule (300 mg total) by mouth 3 (three) times daily.  Marland Kitchen glimepiride (AMARYL) 4 MG tablet Take 1 tablet (4 mg total) by mouth 2 (two) times daily.  . hydrochlorothiazide (HYDRODIURIL) 25 MG tablet Take 1 tablet (25 mg total) by mouth daily.  . hydrOXYzine (ATARAX/VISTARIL) 25 MG tablet Take 1 tablet (25 mg total) by mouth at bedtime.  . Lancets (ACCU-CHEK MULTICLIX) lancets 1 each by Other route 3 (three) times daily.  . metFORMIN (GLUCOPHAGE-XR) 500 MG 24 hr tablet Take 3 tablets (1,500 mg total) by mouth daily with breakfast.  . montelukast (SINGULAIR) 10 MG tablet Take 1 tablet (10 mg total) by mouth at bedtime.  . Multiple Vitamins-Iron (DAILY MULTIVITAMINS/IRON PO) Take 1 tablet by mouth daily.   . pantoprazole (PROTONIX) 40 MG tablet Take 1 tablet (40 mg total) by mouth daily.  . sitaGLIPtin (JANUVIA) 100 MG tablet Take 100 mg by mouth daily.    . [DISCONTINUED] amLODipine (NORVASC) 10 MG tablet Take 1 tablet (10 mg total) by mouth daily.  . [DISCONTINUED] benazepril (LOTENSIN) 20 MG tablet Take 1 tablet (20 mg total) by mouth daily.  . [DISCONTINUED] fluticasone (VERAMYST) 27.5 MCG/SPRAY nasal spray Place 2 sprays into the nose daily.     Allergies:   Statins   Social History  Socioeconomic History  . Marital status: Single    Spouse name: Not on file  . Number of children: 0  . Years of education: 16  . Highest education level: Bachelor's degree (e.g., BA, AB, BS)  Occupational History  . Occupation: Sales executive: [employer]  Tobacco Use  . Smoking status: Never Smoker  . Smokeless tobacco: Never Used  Substance and Sexual Activity  . Alcohol use: No  . Drug use: Never  . Sexual activity: Not Currently  Other Topics Concern  . Not on file  Social History Narrative   Lives in an apartment on the 2nd floor.  No children.  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  Education: college.    Social Determinants of Health    Financial Resource Strain:   . Difficulty of Paying Living Expenses: Not on file  Food Insecurity:   . Worried About Charity fundraiser in the Last Year: Not on file  . Ran Out of Food in the Last Year: Not on file  Transportation Needs:   . Lack of Transportation (Medical): Not on file  . Lack of Transportation (Non-Medical): Not on file  Physical Activity:   . Days of Exercise per Week: Not on file  . Minutes of Exercise per Session: Not on file  Stress:   . Feeling of Stress : Not on file  Social Connections:   . Frequency of Communication with Friends and Family: Not on file  . Frequency of Social Gatherings with Friends and Family: Not on file  . Attends Religious Services: Not on file  . Active Member of Clubs or Organizations: Not on file  . Attends Archivist Meetings: Not on file  . Marital Status: Not on file     Family History: The patient's family history includes Diabetes type II in her mother; Glaucoma in her father; Hypertension in her father and mother; Multiple sclerosis in her sister. There is no history of Breast cancer.  ROS:   Please see the history of present illness.     All other systems reviewed and are negative.  EKGs/Labs/Other Studies Reviewed:    The following studies were reviewed today:   EKG:     Jan. 26, 2021    NSR at 87.   No ST or T wave   Recent Labs: 10/03/2019: BUN 17; Creatinine, Ser 0.62; Potassium 4.4; Sodium 137  Recent Lipid Panel    Component Value Date/Time   CHOL 188 10/03/2019 1642   TRIG 66 10/03/2019 1642   HDL 71 10/03/2019 1642   CHOLHDL 2.6 10/03/2019 1642   CHOLHDL 2.5 Ratio 11/18/2009 2045   VLDL 10 11/18/2009 2045   LDLCALC 105 (H) 10/03/2019 1642    Physical Exam:    VS:  BP 132/84   Pulse 96   Ht 5\' 3"  (1.6 m)   Wt 231 lb (104.8 kg)   SpO2 98%   BMI 40.92 kg/m     Wt Readings from Last 3 Encounters:  11/08/19 231 lb (104.8 kg)  10/03/19 220 lb (99.8 kg)  08/12/18 213 lb 8 oz (96.8  kg)     GEN:  Well nourished, well developed in no acute distress HEENT: Normal NECK: No JVD; No carotid bruits LYMPHATICS: No lymphadenopathy CARDIAC: RRR, no murmurs, rubs, gallops RESPIRATORY:  Clear to auscultation without rales, wheezing or rhonchi  ABDOMEN: Soft, non-tender, non-distended MUSCULOSKELETAL:  No edema; No deformity  SKIN: Warm and dry NEUROLOGIC:  Alert and oriented x 3  PSYCHIATRIC:  Normal affect   ASSESSMENT:    1. Lower extremity edema   2. Essential hypertension   3. Chest pain of uncertain etiology   4. Leg edema    PLAN:    In order of problems listed above:  1. Chronic diastolic congestive heart failure: Kristine Bauer presents with symptoms of volume overload and leg edema.  Her recent echocardiogram reveals grade 1 diastolic dysfunction.  She clearly eats a high salt diet.  She has bilateral leg edema.  We had a long talk about reduction of salt intake.  She needs to consider compression hose.  We will give her information on the lounge doctor leg rest for leg elevation.  She is on amlodipine 10 mg a day.  We will reduce her amlodipine to 5 mg a day.   Discontinue benazepril.   we will start her on valsartan 160 mg a day. We will have her reduce the extra salty foods that she is eating.  She eats bacon and country ham on a regular basis.  BMP in 3 weeks   Will have her see an APP in 3 months    Medication Adjustments/Labs and Tests Ordered: Current medicines are reviewed at length with the patient today.  Concerns regarding medicines are outlined above.  Orders Placed This Encounter  Procedures  . Basic metabolic panel   Meds ordered this encounter  Medications  . amLODipine (NORVASC) 5 MG tablet    Sig: Take 1 tablet (5 mg total) by mouth daily.    Dispense:  90 tablet    Refill:  1    Dose change- decreased  . valsartan (DIOVAN) 160 MG tablet    Sig: Take 1 tablet (160 mg total) by mouth daily.    Dispense:  90 tablet    Refill:  1     Patient Instructions  Medication Instructions:   STOP TAKING BENAZEPRIL NOW  DECREASE YOUR AMLODIPINE TO 5 MG BY MOUTH DAILY  START TAKING VALSARTAN 160 MG BY MOUTH DAILY  *If you need a refill on your cardiac medications before your next appointment, please call your pharmacy*   Lab Work:  IN 3 WEEKS--TO CHECK A --BMET  If you have labs (blood work) drawn today and your tests are completely normal, you will receive your results only by: Marland Kitchen MyChart Message (if you have MyChart) OR . A paper copy in the mail If you have any lab test that is abnormal or we need to change your treatment, we will call you to review the results.   Follow-Up: At [facility], you and your health needs are our priority.  As part of our continuing mission to provide you with exceptional heart care, we have created designated Provider Care Teams.  These Care Teams include your primary Cardiologist (physician) and Advanced Practice Providers (APPs -  Physician Assistants and Nurse Practitioners) who all work together to provide you with the care you need, when you need it.  We recommend signing up for the patient portal called "MyChart".  Sign up information is provided on this After Visit Summary.  MyChart is used to connect with patients for Virtual Visits (Telemedicine).  Patients are able to view lab/test results, encounter notes, upcoming appointments, etc.  Non-urgent messages can be sent to your provider as well.   To learn more about what you can do with MyChart, go to [email or web address].     Your next appointment:   3 month(s)  The format for your next appointment:  WITH AN EXTENDER ON DR. Eleyna Brugh'S TEAM IN 3 MONTHS IN PERSON  Provider:   WITH AN EXTENDER ON DR. Jarone Ostergaard'S TEAM IN 3 MONTHS IN PERSON   For your  leg edema you  should do  the following 1. Leg elevation - I recommend the Lounge Dr. Leg rest.  See below for details  2. Salt restriction  -  Use potassium chloride instead  of regular salt as a salt substitute. 3. Walk regularly 4. Compression hose - [facility] 5. Weight loss    Available on [email or web address] Or  Go to [email or web address]         Signed, Mertie Moores, MD  11/08/2019 5:31 PM    [place]

## 2019-11-28 ENCOUNTER — Other Ambulatory Visit: Payer: Self-pay

## 2019-11-28 ENCOUNTER — Encounter: Payer: Self-pay | Admitting: Podiatry

## 2019-11-28 ENCOUNTER — Ambulatory Visit (INDEPENDENT_AMBULATORY_CARE_PROVIDER_SITE_OTHER): Payer: 59 | Admitting: Podiatry

## 2019-11-28 VITALS — Temp 97.3°F

## 2019-11-28 DIAGNOSIS — Q828 Other specified congenital malformations of skin: Secondary | ICD-10-CM | POA: Diagnosis not present

## 2019-11-28 DIAGNOSIS — I872 Venous insufficiency (chronic) (peripheral): Secondary | ICD-10-CM

## 2019-11-28 DIAGNOSIS — M7989 Other specified soft tissue disorders: Secondary | ICD-10-CM

## 2019-11-28 NOTE — Progress Notes (Signed)
She presents today for follow-up of her edema she states that her legs have been feeling heavy ever since her doctor changed her medication.  She also has calluses to the plantar medial aspect of the bilateral foot would like me to smooth the calluses down.  Objective: Vital signs are stable she is alert and oriented x3.  Pulses are palpable.  Pes planus with reactive hyper keratomas plantar and plantar medial aspect of the bilateral foot much decrease in edema to almost normal state.  Assessment: Resolving edema bilaterally most likely associated with her amlodipine dosage.  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  Plan: Debridement of all reactive hyperkeratosis bilateral.

## 2019-12-04 ENCOUNTER — Other Ambulatory Visit: Payer: 59

## 2019-12-04 ENCOUNTER — Other Ambulatory Visit: Payer: Self-pay

## 2019-12-04 DIAGNOSIS — R600 Localized edema: Secondary | ICD-10-CM

## 2019-12-04 DIAGNOSIS — I10 Essential (primary) hypertension: Secondary | ICD-10-CM

## 2019-12-06 ENCOUNTER — Other Ambulatory Visit: Payer: Self-pay | Admitting: *Deleted

## 2019-12-06 LAB — BASIC METABOLIC PANEL
BUN/Creatinine Ratio: 27 — ABNORMAL HIGH (ref 9–23)
BUN: 21 mg/dL (ref 6–24)
CO2: 21 mmol/L (ref 20–29)
Calcium: 9.9 mg/dL (ref 8.7–10.2)
Chloride: 98 mmol/L (ref 96–106)
Creatinine, Ser: 0.77 mg/dL (ref 0.57–1.00)
GFR calc Af Amer: 101 mL/min/{1.73_m2} (ref >59)
GFR calc non Af Amer: 87 mL/min/{1.73_m2} (ref >59)
Glucose: 395 mg/dL — ABNORMAL HIGH (ref 65–99)
Potassium: 4.6 mmol/L (ref 3.5–5.2)
Sodium: 134 mmol/L (ref 134–144)

## 2020-01-01 ENCOUNTER — Encounter: Payer: Self-pay | Admitting: Internal Medicine

## 2020-01-01 ENCOUNTER — Telehealth (INDEPENDENT_AMBULATORY_CARE_PROVIDER_SITE_OTHER): Payer: 59 | Admitting: Internal Medicine

## 2020-01-01 DIAGNOSIS — I5032 Chronic diastolic (congestive) heart failure: Secondary | ICD-10-CM

## 2020-01-01 DIAGNOSIS — I10 Essential (primary) hypertension: Secondary | ICD-10-CM | POA: Diagnosis not present

## 2020-01-01 DIAGNOSIS — E119 Type 2 diabetes mellitus without complications: Secondary | ICD-10-CM | POA: Diagnosis not present

## 2020-01-01 DIAGNOSIS — Z1211 Encounter for screening for malignant neoplasm of colon: Secondary | ICD-10-CM

## 2020-01-01 HISTORY — DX: Chronic diastolic (congestive) heart failure: I50.32

## 2020-01-01 MED ORDER — FLUTICASONE PROPIONATE 50 MCG/ACT NA SUSP
2.0000 | Freq: Every day | NASAL | 6 refills | Status: DC
Start: 2020-01-01 — End: 2020-06-21

## 2020-01-01 MED ORDER — HYDROXYZINE PAMOATE 25 MG PO CAPS
25.0000 mg | ORAL_CAPSULE | Freq: Every evening | ORAL | 2 refills | Status: DC | PRN
Start: 2020-01-01 — End: 2020-02-15

## 2020-01-01 NOTE — Progress Notes (Signed)
Virtual Visit via Telephone Note  I connected with Kristine Bauer, on 01/01/2020 at 3:33 PM by telephone due to the COVID-19 pandemic and verified that I am speaking with the correct person using two identifiers.   Consent: I discussed the limitations, risks, security and privacy concerns of performing an evaluation and management service by telephone and the availability of in person appointments. I also discussed with the patient that there may be a patient responsible charge related to this service. The patient expressed understanding and agreed to proceed.   Location of Patient: Home   Location of Provider: Clinic    Persons participating in Telemedicine visit: Mekhiya Prestage [facility] Dr. Juleen China      History of Present Illness: Patient has a visit to f/u on chronic medical conditions. At last office visit on 1/26, patient had LE edema and chest pain. She was sent for echo and cardiology consult. Found to have chronic diastolic heart failure. At that time, her Amlodipine was decreased to 5 mg daily in attempt to reduce LE edema and she was also instructed to adhere to low salt diet. Reports the edema has greatly improved since these changes were made.   Diabetes mellitus, Type 2 Disease Monitoring             Blood Sugar Ranges:  Does not monitor.              Polyuria: No              Visual problems: no   Urine Microalbumin Has not been checked. Patient is on Arb therapy.   Last A1C: 11.7 (Jan 2021)   Medications: Amaryl 4 mg BID, Januvia 100 mg, Metformin 1500 mg daily  Medication Compliance: yes  Medication Side Effects             Hypoglycemia: no    Past Medical History:  Diagnosis Date  . Allergic rhinitis   . Anemia   . Asthma   . Chest pain    LEFT SIDED-SEVERAL YEARS  . Diabetes mellitus, type 2 ([place])   . GERD (gastroesophageal reflux disease)   . HTN (hypertension)   . Hyperlipidemia   . Vitamin D deficiency    Allergies   Allergen Reactions  . Statins Other (See Comments)    Myopathy    Current Outpatient Medications on File Prior to Visit  Medication Sig Dispense Refill  . amLODipine (NORVASC) 5 MG tablet Take 1 tablet (5 mg total) by mouth daily. 90 tablet 1  . aspirin EC 81 MG tablet Take 81 mg by mouth daily.    . Cholecalciferol (VITAMIN D) 50 MCG (2000 UT) tablet Take 2,000 Units by mouth daily.    . fluticasone (FLONASE) 50 MCG/ACT nasal spray Place 1 spray into both nostrils 2 (two) times daily.    Marland Kitchen gabapentin (NEURONTIN) 300 MG capsule Take 1 capsule (300 mg total) by mouth 3 (three) times daily. 90 capsule 3  . glimepiride (AMARYL) 4 MG tablet Take 1 tablet (4 mg total) by mouth 2 (two) times daily. 180 tablet 0  . hydrochlorothiazide (HYDRODIURIL) 25 MG tablet Take 1 tablet (25 mg total) by mouth daily. 90 tablet 0  . hydrOXYzine (ATARAX/VISTARIL) 25 MG tablet Take 1 tablet (25 mg total) by mouth at bedtime. 30 tablet 2  . Lancets (ACCU-CHEK MULTICLIX) lancets 1 each by Other route 3 (three) times daily.    . metFORMIN (GLUCOPHAGE-XR) 500 MG 24 hr tablet Take 3 tablets (1,500 mg total)  by mouth daily with breakfast. 270 tablet 0  . montelukast (SINGULAIR) 10 MG tablet Take 1 tablet (10 mg total) by mouth at bedtime. 90 tablet 0  . Multiple Vitamins-Iron (DAILY MULTIVITAMINS/IRON PO) Take 1 tablet by mouth daily.     . pantoprazole (PROTONIX) 40 MG tablet Take 1 tablet (40 mg total) by mouth daily. 90 tablet 0  . sitaGLIPtin (JANUVIA) 100 MG tablet Take 100 mg by mouth daily.      . valsartan (DIOVAN) 160 MG tablet Take 1 tablet (160 mg total) by mouth daily. 90 tablet 1  . [DISCONTINUED] diphenhydrAMINE (BENADRYL) 25 MG tablet Take 50 mg by mouth every 6 (six) hours as needed for allergies.     No current facility-administered medications on file prior to visit.    Observations/Objective: NAD. Speaking clearly.  Work of breathing normal.  Alert and oriented. Mood appropriate.   Assessment  and Plan: 1. Type 2 diabetes mellitus without complication, unspecified whether long term insulin use (HCC) Last A1c with poor glycemic control. Patient is not monitoring her CBGs but does have supply. Have asked her to take a fasting sugar daily at a minimum. Repeat A1c. If A1c still significantly elevated, would be a candidate for insulin therapy.  Counseled on Diabetic diet, my plate method, [id number] minutes of moderate intensity exercise/week Blood sugar logs with fasting goals of 80-120 mg/dl, random of less than 180 and in the event of sugars less than 60 mg/dl or greater than 400 mg/dl encouraged to notify the clinic. Advised on the need for annual eye exams, annual foot exams, Pneumonia vaccine. - Hemoglobin A1c; Future - Microalbumin / creatinine urine ratio; Future  2. Essential hypertension Continue current regimen. Switched from Ace Inhibitor to Arb by cardiology. Amlodipine was decreased in attempt to help LE edema.  - CBC with Differential; Future  3. Colon cancer screening - Ambulatory referral to Gastroenterology  4. Chronic diastolic heart failure (HCC) Echo on 10/16/19 showed normal LVEF 55-60% with Grade I DD.    Follow Up Instructions: Lab visit and 3 month f/u for chronic medical conditions    I discussed the assessment and treatment plan with the patient. The patient was provided an opportunity to ask questions and all were answered. The patient agreed with the plan and demonstrated an understanding of the instructions.   The patient was advised to call back or seek an in-person evaluation if the symptoms worsen or if the condition fails to improve as anticipated.     I provided 22 minutes total of non-face-to-face time during this encounter including median intraservice time, reviewing previous notes, investigations, ordering medications, medical decision making, coordinating care and patient verbalized understanding at the end of the visit.    Phill Myron,  D.O. Primary Care at [facility]  01/01/2020, 3:33 PM

## 2020-01-04 ENCOUNTER — Other Ambulatory Visit: Payer: Self-pay

## 2020-01-04 ENCOUNTER — Other Ambulatory Visit: Payer: 59

## 2020-01-04 DIAGNOSIS — E119 Type 2 diabetes mellitus without complications: Secondary | ICD-10-CM

## 2020-01-04 DIAGNOSIS — I10 Essential (primary) hypertension: Secondary | ICD-10-CM

## 2020-01-04 MED ORDER — ALBUTEROL SULFATE HFA 108 (90 BASE) MCG/ACT IN AERS: 1.0000 | INHALATION_SPRAY | Freq: Four times a day (QID) | RESPIRATORY_TRACT | 3 refills | Status: AC | PRN

## 2020-01-05 LAB — CBC WITH DIFFERENTIAL/PLATELET
Basophils Absolute: 0 10*3/uL (ref 0.0–0.2)
Basos: 1 %
EOS (ABSOLUTE): 0.3 10*3/uL (ref 0.0–0.4)
Eos: 4 %
Hematocrit: 37.6 % (ref 34.0–46.6)
Hemoglobin: 11.2 g/dL (ref 11.1–15.9)
Immature Grans (Abs): 0 10*3/uL (ref 0.0–0.1)
Immature Granulocytes: 0 %
Lymphocytes Absolute: 2.7 10*3/uL (ref 0.7–3.1)
Lymphs: 32 %
MCH: 23.9 pg — ABNORMAL LOW (ref 26.6–33.0)
MCHC: 29.8 g/dL — ABNORMAL LOW (ref 31.5–35.7)
MCV: 80 fL (ref 79–97)
Monocytes Absolute: 0.7 10*3/uL (ref 0.1–0.9)
Monocytes: 8 %
Neutrophils Absolute: 4.7 10*3/uL (ref 1.4–7.0)
Neutrophils: 55 %
Platelets: 310 10*3/uL (ref 150–450)
RBC: 4.69 x10E6/uL (ref 3.77–5.28)
RDW: 14.9 % (ref 11.7–15.4)
WBC: 8.5 10*3/uL (ref 3.4–10.8)

## 2020-01-05 LAB — MICROALBUMIN / CREATININE URINE RATIO
Creatinine, Urine: 81.2 mg/dL
Microalb/Creat Ratio: 51 mg/g creat — ABNORMAL HIGH (ref 0–29)
Microalbumin, Urine: 41.5 ug/mL

## 2020-01-05 LAB — HEMOGLOBIN A1C
Est. average glucose Bld gHb Est-mCnc: 332 mg/dL
Hgb A1c MFr Bld: 13.2 % — ABNORMAL HIGH (ref 4.8–5.6)

## 2020-01-08 ENCOUNTER — Other Ambulatory Visit: Payer: Self-pay | Admitting: Family Medicine

## 2020-01-08 MED ORDER — LANTUS SOLOSTAR 100 UNIT/ML ~~LOC~~ SOPN
10.0000 [IU] | PEN_INJECTOR | Freq: Every day | SUBCUTANEOUS | 3 refills | Status: DC
Start: 2020-01-08 — End: 2020-12-04

## 2020-01-08 NOTE — Progress Notes (Signed)
Patient notified of results & recommendations. Expressed understanding. Patient scheduled an appointment with Lurena Joiner on 01/22/2020 at 4 PM.

## 2020-01-11 ENCOUNTER — Other Ambulatory Visit: Payer: Self-pay | Admitting: Pharmacist

## 2020-01-11 MED ORDER — VITAMIN D 50 MCG (2000 UT) PO TABS: 2000.0000 [IU] | ORAL_TABLET | Freq: Every day | ORAL | 1 refills | Status: DC

## 2020-01-15 ENCOUNTER — Other Ambulatory Visit: Payer: Self-pay | Admitting: Pharmacist

## 2020-01-15 MED ORDER — TRUEPLUS PEN NEEDLES 32G X 4 MM MISC: 11 refills | Status: AC

## 2020-01-15 NOTE — Progress Notes (Signed)
tru

## 2020-01-22 ENCOUNTER — Encounter: Payer: Self-pay | Admitting: Pharmacist

## 2020-01-22 ENCOUNTER — Ambulatory Visit: Payer: 59 | Attending: Internal Medicine | Admitting: Pharmacist

## 2020-01-22 ENCOUNTER — Other Ambulatory Visit: Payer: Self-pay

## 2020-01-22 DIAGNOSIS — E118 Type 2 diabetes mellitus with unspecified complications: Secondary | ICD-10-CM | POA: Diagnosis not present

## 2020-01-22 DIAGNOSIS — E1165 Type 2 diabetes mellitus with hyperglycemia: Secondary | ICD-10-CM | POA: Diagnosis not present

## 2020-01-22 DIAGNOSIS — IMO0002 Reserved for concepts with insufficient information to code with codable children: Secondary | ICD-10-CM

## 2020-01-22 MED ORDER — GLUCOSE BLOOD VI STRP: ORAL_STRIP | 6 refills | Status: DC

## 2020-01-22 NOTE — Patient Instructions (Signed)
1. Clean the area.   2. Pinch the area lightly.   3. Inject the needle.   4. Let go of the pinch.   5. Press the button until it gets to 0.   6. Count to 10. Then take the needle out.

## 2020-01-22 NOTE — Progress Notes (Signed)
Patient was educated on the use of the Prodigy blood glucose meter. Reviewed necessary supplies and operation of the meter. Also reviewed goal blood glucose levels. Patient was able to demonstrate use. All questions and concerns were addressed.   Patient was educated on the use of the Lantus pen. Reviewed necessary supplies and operation of the pen.  Benard Halsted, PharmD, [place] [phone]

## 2020-01-25 ENCOUNTER — Telehealth: Payer: Self-pay | Admitting: Internal Medicine

## 2020-01-25 MED ORDER — PRODIGY NO CODING BLOOD GLUC VI STRP: ORAL_STRIP | 12 refills | Status: AC

## 2020-01-25 NOTE — Telephone Encounter (Signed)
Rx sent 

## 2020-01-25 NOTE — Telephone Encounter (Signed)
1) Medication(s) Requested (by name): Prodigy No Coding Blood Glucose Test Strips  2) [id number] [place], [place] - [address] [place] [address]   3) Special Requests:   Approved medications will be sent to the pharmacy, we will reach out if there is an issue.  Requests made after 3pm may not be addressed until the following business day!  If a patient is unsure of the name of the medication(s) please note and ask patient to call back when they are able to provide all info, do not send to responsible party until all information is available!

## 2020-02-06 ENCOUNTER — Telehealth: Payer: Self-pay | Admitting: Internal Medicine

## 2020-02-06 DIAGNOSIS — E119 Type 2 diabetes mellitus without complications: Secondary | ICD-10-CM

## 2020-02-06 NOTE — Telephone Encounter (Signed)
1) Medication(s) Requested (by name): metFORMIN (GLUCOPHAGE-XR) 500 MG 24 hr tablet glimepiride (AMARYL) 4 MG tablet montelukast (SINGULAIR) 10 MG tablet pantoprazole (PROTONIX) 40 MG tablet      2) [id number] [place], [place]. 3) Special Requests:   Approved medications will be sent to the pharmacy, we will reach out if there is an issue.  Requests made after 3pm may not be addressed until the following business day!  If a patient is unsure of the name of the medication(s) please note and ask patient to call back when they are able to provide all info, do not send to responsible party until all information is available!

## 2020-02-07 ENCOUNTER — Other Ambulatory Visit: Payer: Self-pay

## 2020-02-07 MED ORDER — PANTOPRAZOLE SODIUM 40 MG PO TBEC: 40.0000 mg | DELAYED_RELEASE_TABLET | Freq: Every day | ORAL | 0 refills | Status: DC

## 2020-02-07 MED ORDER — GLIMEPIRIDE 4 MG PO TABS: 4.0000 mg | ORAL_TABLET | Freq: Two times a day (BID) | ORAL | 0 refills | Status: DC

## 2020-02-07 MED ORDER — METFORMIN HCL 1000 MG PO TABS: 1500.0000 mg | ORAL_TABLET | Freq: Every day | ORAL | 0 refills | Status: DC

## 2020-02-07 MED ORDER — METFORMIN HCL ER 500 MG PO TB24: 1500.0000 mg | ORAL_TABLET | Freq: Every day | ORAL | 0 refills | Status: DC

## 2020-02-07 MED ORDER — MONTELUKAST SODIUM 10 MG PO TABS: 10.0000 mg | ORAL_TABLET | Freq: Every day | ORAL | 0 refills | Status: DC

## 2020-02-07 NOTE — Telephone Encounter (Signed)
Refills sent

## 2020-02-09 ENCOUNTER — Ambulatory Visit (INDEPENDENT_AMBULATORY_CARE_PROVIDER_SITE_OTHER): Payer: 59

## 2020-02-09 ENCOUNTER — Telehealth: Payer: Self-pay | Admitting: Neurology

## 2020-02-09 ENCOUNTER — Ambulatory Visit
Admission: EM | Admit: 2020-02-09 | Discharge: 2020-02-09 | Disposition: A | Discharge status: Home / Self Care | Payer: 59 | Attending: Emergency Medicine | Admitting: Emergency Medicine

## 2020-02-09 DIAGNOSIS — R6883 Chills (without fever): Secondary | ICD-10-CM | POA: Diagnosis not present

## 2020-02-09 DIAGNOSIS — R05 Cough: Secondary | ICD-10-CM

## 2020-02-09 DIAGNOSIS — R600 Localized edema: Secondary | ICD-10-CM

## 2020-02-09 DIAGNOSIS — N921 Excessive and frequent menstruation with irregular cycle: Secondary | ICD-10-CM

## 2020-02-09 DIAGNOSIS — R531 Weakness: Secondary | ICD-10-CM | POA: Diagnosis not present

## 2020-02-09 DIAGNOSIS — R5383 Other fatigue: Secondary | ICD-10-CM

## 2020-02-09 LAB — CBC WITH DIFFERENTIAL/PLATELET
Basophils Absolute: 0 x10E3/uL (ref 0.0–0.2)
Basos: 0 %
EOS (ABSOLUTE): 0.2 x10E3/uL (ref 0.0–0.4)
Eos: 2 %
Hematocrit: 37.8 % (ref 34.0–46.6)
Hemoglobin: 11.3 g/dL (ref 11.1–15.9)
Immature Grans (Abs): 0 x10E3/uL (ref 0.0–0.1)
Immature Granulocytes: 0 %
Lymphocytes Absolute: 1.7 x10E3/uL (ref 0.7–3.1)
Lymphs: 17 %
MCH: 24.9 pg — ABNORMAL LOW (ref 26.6–33.0)
MCHC: 29.9 g/dL — ABNORMAL LOW (ref 31.5–35.7)
MCV: 83 fL (ref 79–97)
Monocytes Absolute: 0.6 x10E3/uL (ref 0.1–0.9)
Monocytes: 6 %
Neutrophils Absolute: 7.4 x10E3/uL — ABNORMAL HIGH (ref 1.4–7.0)
Neutrophils: 75 %
Platelets: 348 x10E3/uL (ref 150–450)
RBC: 4.53 x10E6/uL (ref 3.77–5.28)
RDW: 14.6 % (ref 11.7–15.4)
WBC: 9.9 x10E3/uL (ref 3.4–10.8)

## 2020-02-09 LAB — COMPREHENSIVE METABOLIC PANEL
ALT: 228 IU/L — ABNORMAL HIGH (ref 0–32)
AST: 118 IU/L — ABNORMAL HIGH (ref 0–40)
Albumin/Globulin Ratio: 1.5 (ref 1.2–2.2)
Albumin: 4.4 g/dL (ref 3.8–4.9)
Alkaline Phosphatase: 84 IU/L (ref 48–121)
BUN/Creatinine Ratio: 29 — ABNORMAL HIGH (ref 9–23)
BUN: 23 mg/dL (ref 6–24)
Bilirubin Total: <0.2 mg/dL (ref 0.0–1.2)
CO2: 20 mmol/L (ref 20–29)
Calcium: 9.8 mg/dL (ref 8.7–10.2)
Chloride: 101 mmol/L (ref 96–106)
Creatinine, Ser: 0.78 mg/dL (ref 0.57–1.00)
GFR calc Af Amer: 99 mL/min/{1.73_m2} (ref >59)
GFR calc non Af Amer: 86 mL/min/{1.73_m2} (ref >59)
Globulin, Total: 2.9 g/dL (ref 1.5–4.5)
Glucose: 350 mg/dL — ABNORMAL HIGH (ref 65–99)
Potassium: 4.1 mmol/L (ref 3.5–5.2)
Sodium: 137 mmol/L (ref 134–144)
Total Protein: 7.3 g/dL (ref 6.0–8.5)

## 2020-02-09 LAB — TSH: TSH: 1.56 u[IU]/mL (ref 0.450–4.500)

## 2020-02-09 LAB — BRAIN NATRIURETIC PEPTIDE: BNP: 5.7 pg/mL (ref 0.0–100.0)

## 2020-02-09 LAB — POCT FASTING CBG KUC MANUAL ENTRY: POCT Glucose (KUC): 380 mg/dL — AB (ref 70–99)

## 2020-02-09 NOTE — Discharge Instructions (Addendum)
Blood work pending: We will call you with results by tomorrow morning. Please call our office by 9 AM if you have not heard from [place]. Important to go to the emergency room in the meantime if you develop difficulty breathing, chest pain, lightheadedness. Important to keep follow-up with PCP on 6/10.

## 2020-02-09 NOTE — Telephone Encounter (Signed)
Patient advised to contact pcp.

## 2020-02-09 NOTE — Telephone Encounter (Signed)
This patient of Dr. Serita Grit called requesting an appointment for today or as soon as possible. She said, "My legs are really bothering me and they're swollen. I also feel really tired."  Please advise[facility] [address]

## 2020-02-09 NOTE — ED Triage Notes (Signed)
Pt c/o extreme weakness and fatigue all week. States weakness started a few weeks ago but not this bad. States went 11 months with no cycle and had a one day cycle in may and then started back June 1st and its very heavy. Pt states having numbness and tingling in lt leg last night, better today.

## 2020-02-09 NOTE — ED Provider Notes (Signed)
[facility]    CSN: [id number] Arrival date & time: 02/09/20  1502      History   Chief Complaint Chief Complaint  Patient presents with  . Weakness    HPI Kristine Bauer is a 54 y.o. female with history of obesity, hypertension, diabetes, asthma, CHF presenting for weakness and fatigue.  States is been going on for few weeks, though worse this past week.  Patient feels like she had palpitations last night.  Denying chest pain, cough, fever, recent illness.  Notes that she started Lantus 2 weeks ago: Not checking sugars in the morning as instructed.  Denies polyuria, polydipsia, polyphagia.  Patient currently on her cycle: Last one was 11 months ago-currently perimenopausal.  Denies abdominal or pelvic pain, vaginal discharge.   Past Medical History:  Diagnosis Date  . Allergic rhinitis   . Anemia   . Asthma   . Chest pain    LEFT SIDED-SEVERAL YEARS  . Diabetes mellitus, type 2 ([place])   . GERD (gastroesophageal reflux disease)   . HTN (hypertension)   . Hyperlipidemia   . Vitamin D deficiency     Patient Active Problem List   Diagnosis Date Noted  . Chronic diastolic heart failure ([place]) 01/01/2020  . Leg edema 11/08/2019  . Proximal leg weakness 08/12/2018  . ETD (Eustachian tube dysfunction), bilateral 04/20/2017  . Nasal turbinate hypertrophy 04/20/2017  . Seasonal allergic rhinitis 04/20/2017  . Diabetes mellitus, type 2 ([place])   . Chest pain of uncertain etiology   . HTN (hypertension)   . Asthma   . GERD (gastroesophageal reflux disease)   . Anemia     Past Surgical History:  Procedure Laterality Date  . ENDOMETRIAL BIOPSY      OB History   No obstetric history on file.      Home Medications    Prior to Admission medications   Medication Sig Start Date End Date Taking? Authorizing Provider  albuterol (VENTOLIN HFA) 108 (90 Base) MCG/ACT inhaler Inhale 1-2 puffs into the lungs every 6 (six) hours as needed for wheezing or shortness  of breath. 01/04/20   Nicolette Bang, DO  amLODipine (NORVASC) 5 MG tablet Take 1 tablet (5 mg total) by mouth daily. 11/08/19   Nahser, Wonda Cheng, MD  aspirin EC 81 MG tablet Take 81 mg by mouth daily.    [provider]  azelastine (OPTIVAR) 0.05 % ophthalmic solution  12/22/19   [provider]  BEPREVE 1.5 % SOLN Place 1 drop into both eyes 2 (two) times daily. 12/18/19   [provider]  Cholecalciferol (VITAMIN D) 50 MCG (2000 UT) tablet Take 1 tablet (2,000 Units total) by mouth daily. 01/11/20   Charlott Rakes, MD  fluticasone (FLONASE) 50 MCG/ACT nasal spray Place 2 sprays into both nostrils daily. 01/01/20   Nicolette Bang, DO  gabapentin (NEURONTIN) 300 MG capsule Take 1 capsule (300 mg total) by mouth 3 (three) times daily. 09/14/19   Hyatt, Max T, DPM  glimepiride (AMARYL) 4 MG tablet Take 1 tablet (4 mg total) by mouth 2 (two) times daily. 02/07/20   Nicolette Bang, DO  glucose blood (PRODIGY NO CODING BLOOD GLUC) test strip Use as instructed to check blood sugar twice daily. E11.8 01/25/20   Charlott Rakes, MD  hydrochlorothiazide (HYDRODIURIL) 25 MG tablet Take 1 tablet (25 mg total) by mouth daily. 10/10/19   Nicolette Bang, DO  hydrOXYzine (ATARAX/VISTARIL) 25 MG tablet Take 1 tablet (25 mg total) by  mouth at bedtime. 10/10/19   Nicolette Bang, DO  hydrOXYzine (VISTARIL) 25 MG capsule Take 1 capsule (25 mg total) by mouth at bedtime as needed. 01/01/20   Nicolette Bang, DO  insulin glargine (LANTUS SOLOSTAR) 100 UNIT/ML Solostar Pen Inject 10 Units into the skin daily. 01/08/20   Charlott Rakes, MD  Insulin Pen Needle (TRUEPLUS PEN NEEDLES) 32G X 4 MM MISC Use as instructed to inject insulin daily. 01/15/20   Charlott Rakes, MD  Lancets (ACCU-CHEK MULTICLIX) lancets 1 each by Other route 3 (three) times daily. 03/19/17   [provider]  metFORMIN (GLUCOPHAGE-XR) 500 MG 24 hr tablet Take 3 tablets  (1,500 mg total) by mouth daily with breakfast. 02/07/20   Nicolette Bang, DO  montelukast (SINGULAIR) 10 MG tablet Take 1 tablet (10 mg total) by mouth at bedtime. 02/07/20   Nicolette Bang, DO  Multiple Vitamins-Iron (DAILY MULTIVITAMINS/IRON PO) Take 1 tablet by mouth daily.     [provider]  pantoprazole (PROTONIX) 40 MG tablet Take 1 tablet (40 mg total) by mouth daily. 02/07/20   Nicolette Bang, DO  sitaGLIPtin (JANUVIA) 100 MG tablet Take 100 mg by mouth daily.      [provider]  valsartan (DIOVAN) 160 MG tablet Take 1 tablet (160 mg total) by mouth daily. 11/08/19   Nahser, Wonda Cheng, MD  diphenhydrAMINE (BENADRYL) 25 MG tablet Take 50 mg by mouth every 6 (six) hours as needed for allergies.  04/05/19  [provider]    Family History Family History  Problem Relation Age of Onset  . Hypertension Father   . Glaucoma Father   . Hypertension Mother   . Diabetes type II Mother   . Multiple sclerosis Sister   . Breast cancer Neg Hx     Social History Social History   Tobacco Use  . Smoking status: Never Smoker  . Smokeless tobacco: Never Used  Substance Use Topics  . Alcohol use: No  . Drug use: Never     Allergies   Statins   Review of Systems As per HPI   Physical Exam Triage Vital Signs ED Triage Vitals  Enc Vitals Group     BP      Pulse      Resp      Temp      Temp src      SpO2      Weight      Height      Head Circumference      Peak Flow      Pain Score      Pain Loc      Pain Edu?      Excl. in [place]?    No data found.  Updated Vital Signs BP 137/63 (BP Location: Left Arm)   Pulse (!) 112   Temp 98.3 F (36.8 C) (Oral)   Resp 18   LMP 02/06/2020   SpO2 98%   Visual Acuity Right Eye Distance:   Left Eye Distance:   Bilateral Distance:    Right Eye Near:   Left Eye Near:    Bilateral Near:     Physical Exam Constitutional:      General: She is not in acute distress.     Appearance: She is obese. She is not toxic-appearing or diaphoretic.  HENT:     Head: Normocephalic and atraumatic.     Mouth/Throat:     Mouth: Mucous membranes are moist.  Pharynx: Oropharynx is clear.  Eyes:     General: No scleral icterus.    Pupils: Pupils are equal, round, and reactive to light.     Comments: Conjunctival pallor  Neck:     Comments: Trachea midline, negative JVD.  No thyromegaly Cardiovascular:     Rate and Rhythm: Normal rate.  Pulmonary:     Effort: Pulmonary effort is normal. No respiratory distress.     Breath sounds: No wheezing.  Musculoskeletal:     Cervical back: Neck supple. No tenderness.     Right lower leg: Edema present.     Left lower leg: Edema present.     Comments: 2+ pitting edema bilaterally to knee.  No calf tenderness.  Negative Homans' sign.  Lymphadenopathy:     Cervical: No cervical adenopathy.  Skin:    Capillary Refill: Capillary refill takes less than 2 seconds.     Coloration: Skin is not jaundiced or pale.     Findings: No bruising.  Neurological:     General: No focal deficit present.     Mental Status: She is alert and oriented to person, place, and time.      UC Treatments / Results  Labs (all labs ordered are listed, but only abnormal results are displayed) Labs Reviewed  COMPREHENSIVE METABOLIC PANEL - Abnormal; Notable for the following components:      Result Value   Glucose 350 (*)    BUN/Creatinine Ratio 29 (*)    AST 118 (*)    ALT 228 (*)    All other components within normal limits   Narrative:    Performed at:  [address] [place], [place]  [id number] Lab Director: Rush Farmer MD, Phone:  [phone]  CBC WITH DIFFERENTIAL/PLATELET - Abnormal; Notable for the following components:   MCH 24.9 (*)    MCHC 29.9 (*)    Neutrophils Absolute 7.4 (*)    All other components within normal limits   Narrative:    Performed at:  [facility] [address]  [place], [place]  [id number] Lab Director: Rush Farmer MD, Phone:  [phone]  POCT FASTING CBG [place] - Abnormal; Notable for the following components:   POCT Glucose ([place]) 380 (*)    All other components within normal limits  BRAIN NATRIURETIC PEPTIDE   Narrative:    Performed at:  [address] [facility] [address] [place], [place]  [id number] Lab Director: Rush Farmer MD, Phone:  [phone]  TSH   Narrative:    Performed at:  [address] [place], [place]  [id number] Lab Director: Rush Farmer MD, Phone:  [phone]    EKG   Radiology DG Chest 2 View  Result Date: 02/09/2020 CLINICAL DATA:  54 year old female with weakness, chills, fatigue and minor cough for 1 week. EXAM: CHEST - 2 VIEW COMPARISON:  Chest radiograph 04/05/2019. FINDINGS: Lung volumes and mediastinal contours remain normal. Visualized tracheal air column is within normal limits. Both lungs appear stable and clear. No pneumothorax or pleural effusion. No acute osseous abnormality identified. Paucity of bowel gas in the upper abdomen. IMPRESSION: Negative.  No acute cardiopulmonary abnormality. Electronically Signed   By: Genevie Ann M.D.   On: 02/09/2020 16:01    Procedures Procedures (including critical care time)  Medications Ordered in UC Medications - No data to display  Initial Impression / Assessment and Plan / UC Course  I have reviewed the triage vital signs and the  nursing notes.  Pertinent labs & imaging results that were available during my care of the patient were reviewed by me and considered in my medical decision making (see chart for details).     Patient afebrile, nontoxic in office today.  Patient is mildly tachycardic from baseline, though denying chest pain. CBG 380. Last ate 2.5 hours PTA. Chest x-ray done office, reviewed by me radiology: Negative for infiltrate, pneumothorax, effusion, edema. Blood work pending: If significant abnormalities,  would have patient go to ER for further evaluation/management due to patient's overall health status/comorbidities.   Spoke with patient on the phone regarding blood work: Significant for elevated glucose and liver enzymes.  Patient states AST and ALT values have decreased since last check from outside provider.  No acute process at this time: We will keep follow-up with outside providers and PCP.  Return precautions discussed, patient verbalized understanding and is agreeable to plan. Final Clinical Impressions(s) / UC Diagnoses   Final diagnoses:  Other fatigue  Weakness  Bilateral lower extremity edema  Menorrhagia with irregular cycle     Discharge Instructions     Blood work pending: We will call you with results by tomorrow morning. Please call our office by 9 AM if you have not heard from [place]. Important to go to the emergency room in the meantime if you develop difficulty breathing, chest pain, lightheadedness. Important to keep follow-up with PCP on 6/10.    ED Prescriptions    None     PDMP not reviewed this encounter.   [place], [place], [place] 02/09/20 1959

## 2020-02-12 ENCOUNTER — Ambulatory Visit: Payer: 59 | Admitting: Pharmacist

## 2020-02-15 ENCOUNTER — Telehealth (INDEPENDENT_AMBULATORY_CARE_PROVIDER_SITE_OTHER): Payer: 59 | Admitting: Internal Medicine

## 2020-02-15 ENCOUNTER — Ambulatory Visit: Payer: 59 | Admitting: Pharmacist

## 2020-02-15 ENCOUNTER — Telehealth: Payer: 59 | Admitting: Internal Medicine

## 2020-02-15 ENCOUNTER — Telehealth: Payer: Self-pay | Admitting: Neurology

## 2020-02-15 DIAGNOSIS — R5383 Other fatigue: Secondary | ICD-10-CM | POA: Diagnosis not present

## 2020-02-15 DIAGNOSIS — M6281 Muscle weakness (generalized): Secondary | ICD-10-CM

## 2020-02-15 MED ORDER — HYDROCHLOROTHIAZIDE 25 MG PO TABS: 25.0000 mg | ORAL_TABLET | Freq: Every day | ORAL | 1 refills | Status: DC

## 2020-02-15 MED ORDER — HYDROXYZINE PAMOATE 25 MG PO CAPS: 25.0000 mg | ORAL_CAPSULE | Freq: Every evening | ORAL | 2 refills | Status: DC | PRN

## 2020-02-15 NOTE — Progress Notes (Signed)
Virtual Visit via Telephone Note  I connected with Kristine Bauer, on 02/15/2020 at 2:23 PM by telephone due to the COVID-19 pandemic and verified that I am speaking with the correct person using two identifiers.   Consent: I discussed the limitations, risks, security and privacy concerns of performing an evaluation and management service by telephone and the availability of in person appointments. I also discussed with the patient that there may be a patient responsible charge related to this service. The patient expressed understanding and agreed to proceed.   Location of Patient: Home   Location of Provider: Clinic    Persons participating in Telemedicine visit: Susette [facility] Dr. Juleen China      History of Present Illness: Patient reports she has concerns about leg weakness and extreme fatigue. Having trouble getting up from a chair. The tiredness has been going on for a long, long time. The difficulty getting up has been extreme for the past two weeks. She was seen by neurology for this in 2019. She no longer follows with them.   No recent steroid use. Denies alcohol or drug use.    Past Medical History:  Diagnosis Date  . Allergic rhinitis   . Anemia   . Asthma   . Chest pain    LEFT SIDED-SEVERAL YEARS  . Diabetes mellitus, type 2 ([place])   . GERD (gastroesophageal reflux disease)   . HTN (hypertension)   . Hyperlipidemia   . Vitamin D deficiency    Allergies  Allergen Reactions  . Statins Other (See Comments)    Myopathy    Current Outpatient Medications on File Prior to Visit  Medication Sig Dispense Refill  . albuterol (VENTOLIN HFA) 108 (90 Base) MCG/ACT inhaler Inhale 1-2 puffs into the lungs every 6 (six) hours as needed for wheezing or shortness of breath. 18 g 3  . amLODipine (NORVASC) 5 MG tablet Take 1 tablet (5 mg total) by mouth daily. 90 tablet 1  . aspirin EC 81 MG tablet Take 81 mg by mouth daily.    Marland Kitchen azelastine  (OPTIVAR) 0.05 % ophthalmic solution     . BEPREVE 1.5 % SOLN Place 1 drop into both eyes 2 (two) times daily.    . Cholecalciferol (VITAMIN D) 50 MCG (2000 UT) tablet Take 1 tablet (2,000 Units total) by mouth daily. 90 tablet 1  . fluticasone (FLONASE) 50 MCG/ACT nasal spray Place 2 sprays into both nostrils daily. 16 g 6  . gabapentin (NEURONTIN) 300 MG capsule Take 1 capsule (300 mg total) by mouth 3 (three) times daily. 90 capsule 3  . glimepiride (AMARYL) 4 MG tablet Take 1 tablet (4 mg total) by mouth 2 (two) times daily. 180 tablet 0  . glucose blood (PRODIGY NO CODING BLOOD GLUC) test strip Use as instructed to check blood sugar twice daily. E11.8 100 each 12  . hydrochlorothiazide (HYDRODIURIL) 25 MG tablet Take 1 tablet (25 mg total) by mouth daily. 90 tablet 0  . hydrOXYzine (ATARAX/VISTARIL) 25 MG tablet Take 1 tablet (25 mg total) by mouth at bedtime. 30 tablet 2  . hydrOXYzine (VISTARIL) 25 MG capsule Take 1 capsule (25 mg total) by mouth at bedtime as needed. 30 capsule 2  . insulin glargine (LANTUS SOLOSTAR) 100 UNIT/ML Solostar Pen Inject 10 Units into the skin daily. 5 pen 3  . Insulin Pen Needle (TRUEPLUS PEN NEEDLES) 32G X 4 MM MISC Use as instructed to inject insulin daily. 100 each 11  . Lancets (ACCU-CHEK MULTICLIX)  lancets 1 each by Other route 3 (three) times daily.    . metFORMIN (GLUCOPHAGE-XR) 500 MG 24 hr tablet Take 3 tablets (1,500 mg total) by mouth daily with breakfast. 270 tablet 0  . montelukast (SINGULAIR) 10 MG tablet Take 1 tablet (10 mg total) by mouth at bedtime. 90 tablet 0  . Multiple Vitamins-Iron (DAILY MULTIVITAMINS/IRON PO) Take 1 tablet by mouth daily.     . pantoprazole (PROTONIX) 40 MG tablet Take 1 tablet (40 mg total) by mouth daily. 90 tablet 0  . sitaGLIPtin (JANUVIA) 100 MG tablet Take 100 mg by mouth daily.      . valsartan (DIOVAN) 160 MG tablet Take 1 tablet (160 mg total) by mouth daily. 90 tablet 1  . [DISCONTINUED] diphenhydrAMINE  (BENADRYL) 25 MG tablet Take 50 mg by mouth every 6 (six) hours as needed for allergies.     No current facility-administered medications on file prior to visit.    Observations/Objective: NAD. Speaking clearly.  Work of breathing normal.  Alert and oriented. Mood appropriate.   Assessment and Plan: 1. Proximal muscle weakness 2. Fatigue Patient with history of similar complaint in 2019. Found to have CK of 16k. Statin therapy was stopped at that time. Other labs checked by neurology were unremarkable. 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 She was referred to Gen Surg for muscle biopsy but unfortunately did not follow through with this plan. Her history is concerning for a myositis or PMR. Will check labs and re-refer for EMG studies to be repeated. Reassuringly, she had a benign exam at recent Urgent Care visit on 6/4. At the time, thyroid disorder, anemia, electrolyte derangement, heart failure, hypoglycemia were ruled out as potential causes. LFTs were elevated which could be related to myopathy.  - C-reactive protein; Future - Sedimentation Rate; Future - Lactate Dehydrogenase; Future - CK; Future - Urinalysis, Routine w reflex microscopic; Future - Ambulatory referral to Neurology    Follow Up Instructions: Lab visit 6/14    I discussed the assessment and treatment plan with the patient. The patient was provided an opportunity to ask questions and all were answered. The patient agreed with the plan and demonstrated an understanding of the instructions.   The patient was advised to call back or seek an in-person evaluation if the symptoms worsen or if the condition fails to improve as anticipated.     I provided 38 minutes total of non-face-to-face time during this encounter including median intraservice time, reviewing previous notes, investigations, ordering medications, medical decision making, coordinating care and patient verbalized  understanding at the end of the visit.    Phill Myron, D.O. Primary Care at [facility]  02/15/2020, 2:23 PM

## 2020-02-15 NOTE — Telephone Encounter (Signed)
We got a referral for patient to see Dr Posey Pronto for a Follow up. Pt last seen Patel on 12-26*-19. She does not want to wait until sept or oct to be seen. She would like to speak to someone about what is going on with her. She states that she is struggling and it is hard to do certain things please call

## 2020-02-16 NOTE — Telephone Encounter (Signed)
I contacted patient an advised of note. Patient is going to call PCP, I left message on voicemail .

## 2020-02-16 NOTE — Telephone Encounter (Signed)
Having trouble walking and weakness in legs. Patient seen family dr yesterday an advised that patient needs to be seen. Please advise.

## 2020-02-16 NOTE — Telephone Encounter (Signed)
I read PCP note.  Its only a phone note but they wanted her referred for EMG.  Advised that Dr. Posey Pronto is on medical leave and no one else doing EMG (please call PCP office to let them know).  If they want EMG before she comes back, they will need to refer elsewhere or we can put on schedule when she comes back

## 2020-02-19 ENCOUNTER — Other Ambulatory Visit (INDEPENDENT_AMBULATORY_CARE_PROVIDER_SITE_OTHER): Payer: 59

## 2020-02-19 ENCOUNTER — Other Ambulatory Visit: Payer: Self-pay

## 2020-02-19 DIAGNOSIS — R5383 Other fatigue: Secondary | ICD-10-CM

## 2020-02-19 DIAGNOSIS — M6281 Muscle weakness (generalized): Secondary | ICD-10-CM

## 2020-02-20 LAB — LACTATE DEHYDROGENASE: LDH: 742 IU/L — ABNORMAL HIGH (ref 119–226)

## 2020-02-20 LAB — URINALYSIS, ROUTINE W REFLEX MICROSCOPIC
Bilirubin, UA: NEGATIVE
Ketones, UA: NEGATIVE
Leukocytes,UA: NEGATIVE
Nitrite, UA: NEGATIVE
Protein,UA: NEGATIVE
RBC, UA: NEGATIVE
Specific Gravity, UA: 1.008 (ref 1.005–1.030)
Urobilinogen, Ur: 0.2 mg/dL (ref 0.2–1.0)
pH, UA: 6.5 (ref 5.0–7.5)

## 2020-02-20 LAB — C-REACTIVE PROTEIN: CRP: 9 mg/L (ref 0–10)

## 2020-02-20 LAB — SEDIMENTATION RATE: Sed Rate: 51 mm/hr — ABNORMAL HIGH (ref 0–40)

## 2020-02-20 LAB — CK: Total CK: 6576 U/L (ref 32–182)

## 2020-02-22 ENCOUNTER — Encounter: Payer: Self-pay | Admitting: Internal Medicine

## 2020-02-22 ENCOUNTER — Other Ambulatory Visit (HOSPITAL_COMMUNITY): Payer: Self-pay | Admitting: Internal Medicine

## 2020-02-22 ENCOUNTER — Ambulatory Visit (INDEPENDENT_AMBULATORY_CARE_PROVIDER_SITE_OTHER): Payer: 59 | Admitting: Internal Medicine

## 2020-02-22 ENCOUNTER — Other Ambulatory Visit: Payer: Self-pay

## 2020-02-22 VITALS — BP 114/60 | HR 113 | Temp 98.1°F | Resp 17 | Wt 230.0 lb

## 2020-02-22 DIAGNOSIS — M6281 Muscle weakness (generalized): Secondary | ICD-10-CM | POA: Diagnosis not present

## 2020-02-22 DIAGNOSIS — E119 Type 2 diabetes mellitus without complications: Secondary | ICD-10-CM

## 2020-02-22 NOTE — Progress Notes (Signed)
Subjective:    Kristine Bauer - 54 y.o. female MRN [id number]  Date of birth: 1966/07/02  HPI  Kristine Bauer is here for follow up of proximal muscle weakness. We had a phone visit to discuss this concern on 6/10. Due to lab abnormalities, I asked her to return for labs and exam. She reports muscle weakness has been getting worse. Getting up from a chair is a big struggle. Has difficulty getting off toilet and reaching to wipe due to weakness.   Of note, patient with history of similar complaint in 2019. Found to have CK of 16k. Statin therapy was stopped at that time. Other labs checked by neurology were unremarkable. 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 She was referred to Gen Surg for muscle biopsy but unfortunately did not follow through with this plan.   Health Maintenance:  Health Maintenance Due  Topic Date Due  . Hepatitis C Screening  Never done  . PNEUMOCOCCAL POLYSACCHARIDE VACCINE AGE 36-64 HIGH RISK  Never done  . FOOT EXAM  Never done  . OPHTHALMOLOGY EXAM  Never done  . COVID-19 Vaccine (1) Never done  . TETANUS/TDAP  Never done  . PAP SMEAR-Modifier  Never done  . COLONOSCOPY  Never done    -  reports that she has never smoked. She has never used smokeless tobacco. - Review of Systems: Per HPI. - Past Medical History: Patient Active Problem List   Diagnosis Date Noted  . Chronic diastolic heart failure ([place]) 01/01/2020  . Leg edema 11/08/2019  . Proximal leg weakness 08/12/2018  . ETD (Eustachian tube dysfunction), bilateral 04/20/2017  . Nasal turbinate hypertrophy 04/20/2017  . Seasonal allergic rhinitis 04/20/2017  . Diabetes mellitus, type 2 ([place])   . Chest pain of uncertain etiology   . HTN (hypertension)   . Asthma   . GERD (gastroesophageal reflux disease)   . Anemia    - Medications: reviewed and updated   Objective:   Physical Exam BP 114/60   Pulse (!) 113   Temp 98.1 F (36.7 C) (Temporal)    Resp 17   Wt 230 lb (104.3 kg)   LMP 02/06/2020   SpO2 96%   BMI 40.74 kg/m  Physical Exam Constitutional:      General: She is not in acute distress.    Appearance: She is not diaphoretic.  Cardiovascular:     Rate and Rhythm: Normal rate.  Pulmonary:     Effort: Pulmonary effort is normal. No respiratory distress.  Musculoskeletal:        General: Normal range of motion.  Skin:    General: Skin is warm and dry.  Neurological:     Mental Status: She is alert and oriented to person, place, and time.     Comments: Strength 4/5 in hip flexors and adductors. Strength 4/5 in right upper arm compared to left.   Psychiatric:        Mood and Affect: Affect normal.        Judgment: Judgment normal.            Assessment & Plan:   1. Proximal muscle weakness Remarkable laboratory studies: CK 6576. LDH 742. ESR 51. AST 118. ALT 228. I am concerned for myositis as etiology of muscle weakness. Warrants muscle biopsy. Will place referral to Gen Surgery.  - Ambulatory referral to General Surgery  2. Type 2 diabetes mellitus without complication, unspecified whether long term insulin use (HCC) A1c with poor glycemic control  with result of 13.2 on 4/29. She was started on Lantus 10u in early May. She has not been checking her CBGs. Check fasting CBGs and record log. Return within one month to review and adjust insulin dose as needed.      Phill Myron, D.O. 02/22/2020, 2:59 PM Primary Care at [facility]

## 2020-02-25 NOTE — Progress Notes (Signed)
Cardiology Office Note:    Date:  02/26/2020   ID:  Kristine Bauer, DOB 1966/05/06, MRN [id number]  PCP:  Nicolette Bang, DO  Cardiologist:  Mertie Moores, MD   Electrophysiologist:  None   Referring MD: Caryl Never*   Chief Complaint:  Follow-up (CHF, HTN)    Patient Profile:    Kristine Bauer is a 54 y.o. female with:   Diastolic CHF  Hypertension   Diabetes mellitus   GERD   Hyperlipidemia   Prior CV studies: Echocardiogram 10/16/2019 EF 55-60, GR 1 DD, GLS -17.9%, normal RV SF  History of Present Illness:    Kristine Bauer was evaluated by Dr. Acie Fredrickson in March 2021 for chest discomfort.  She was noted to be volume overloaded and also had evidence of mild diastolic dysfunction on recent echocardiogram.  Medications were adjusted for blood pressure and she was counseled on reducing sodium intake.     She returns for follow-up.  She notes significant weakness that has been ongoing for the past couple of years.  It has waxed and waned and recently has gotten worse.  Her total CK has been over 6000.  In 2019, it was over 16,000.  Her liver enzymes have also been significantly elevated.  She has seen primary care and is being referred to general surgery for a muscle biopsy.  She has occasional chest discomfort that is present at rest.  Her weakness has gotten significantly worse and it is difficult for her to get off of a chair.  She has not had exertional chest symptoms.  She has shortness of breath with activity.  She has not had orthopnea, PND.  She has chronic lower extremity swelling that is fairly stable.  She has not had syncope.  Past Medical History:  Diagnosis Date  . Allergic rhinitis   . Anemia   . Asthma   . Chest pain    LEFT SIDED-SEVERAL YEARS  . Chronic diastolic heart failure ([place]) 01/01/2020  . Diabetes mellitus, type 2 ([place])   . ETD (Eustachian tube dysfunction), bilateral 04/20/2017  . GERD (gastroesophageal reflux disease)    . HTN (hypertension)   . Hyperlipidemia   . Nasal turbinate hypertrophy 04/20/2017  . Vitamin D deficiency     Current Medications: Current Meds  Medication Sig  . albuterol (VENTOLIN HFA) 108 (90 Base) MCG/ACT inhaler Inhale 1-2 puffs into the lungs every 6 (six) hours as needed for wheezing or shortness of breath.  Marland Kitchen amLODipine (NORVASC) 5 MG tablet Take 1 tablet (5 mg total) by mouth daily.  Marland Kitchen aspirin EC 81 MG tablet Take 81 mg by mouth daily.  Marland Kitchen azelastine (OPTIVAR) 0.05 % ophthalmic solution   . BEPREVE 1.5 % SOLN Place 1 drop into both eyes 2 (two) times daily.  . Cholecalciferol (VITAMIN D) 50 MCG (2000 UT) tablet Take 1 tablet (2,000 Units total) by mouth daily.  . fluticasone (FLONASE) 50 MCG/ACT nasal spray Place 2 sprays into both nostrils daily.  Marland Kitchen gabapentin (NEURONTIN) 300 MG capsule Take 1 capsule (300 mg total) by mouth 3 (three) times daily.  Marland Kitchen glimepiride (AMARYL) 4 MG tablet Take 1 tablet (4 mg total) by mouth 2 (two) times daily.  Marland Kitchen glucose blood (PRODIGY NO CODING BLOOD GLUC) test strip Use as instructed to check blood sugar twice daily. E11.8  . hydrochlorothiazide (HYDRODIURIL) 25 MG tablet Take 1 tablet (25 mg total) by mouth daily.  . hydrOXYzine (VISTARIL) 25 MG capsule Take 1 capsule (25 mg total)  by mouth at bedtime as needed.  . insulin glargine (LANTUS SOLOSTAR) 100 UNIT/ML Solostar Pen Inject 10 Units into the skin daily.  . Insulin Pen Needle (TRUEPLUS PEN NEEDLES) 32G X 4 MM MISC Use as instructed to inject insulin daily.  . Lancets (ACCU-CHEK MULTICLIX) lancets 1 each by Other route 3 (three) times daily.  . metFORMIN (GLUCOPHAGE-XR) 500 MG 24 hr tablet Take 3 tablets (1,500 mg total) by mouth daily with breakfast.  . montelukast (SINGULAIR) 10 MG tablet Take 1 tablet (10 mg total) by mouth at bedtime.  . Multiple Vitamins-Iron (DAILY MULTIVITAMINS/IRON PO) Take 1 tablet by mouth daily.   . pantoprazole (PROTONIX) 40 MG tablet Take 1 tablet (40 mg total)  by mouth daily.  . sitaGLIPtin (JANUVIA) 100 MG tablet Take 100 mg by mouth daily.    . valsartan (DIOVAN) 160 MG tablet Take 1 tablet (160 mg total) by mouth daily.     Allergies:   Statins   Social History   Tobacco Use  . Smoking status: Never Smoker  . Smokeless tobacco: Never Used  Vaping Use  . Vaping Use: Never used  Substance Use Topics  . Alcohol use: No  . Drug use: Never     Family Hx: The patient's family history includes Diabetes type II in her mother; Glaucoma in her father; Hypertension in her father and mother; Multiple sclerosis in her sister. There is no history of Breast cancer.  ROS   EKGs/Labs/Other Test Reviewed:    EKG:  EKG is   ordered today.  The ekg ordered today demonstrates sinus tachycardia, heart rate 102, normal axis, no ST-T wave changes, QTC 477  Recent Labs: 02/09/2020: ALT 228; BNP 5.7; BUN 23; Creatinine, Ser 0.78; Hemoglobin 11.3; Platelets 348; Potassium 4.1; Sodium 137; TSH 1.560   Recent Lipid Panel Lab Results  Component Value Date/Time   CHOL 188 10/03/2019 04:42 PM   TRIG 66 10/03/2019 04:42 PM   HDL 71 10/03/2019 04:42 PM   CHOLHDL 2.6 10/03/2019 04:42 PM   CHOLHDL 2.5 Ratio 11/18/2009 08:45 PM   LDLCALC 105 (H) 10/03/2019 04:42 PM    Physical Exam:    VS:  BP 140/80   Pulse (!) 102   Ht 5\' 3"  (1.6 m)   Wt 227 lb (103 kg)   LMP 02/06/2020   SpO2 97%   BMI 40.21 kg/m     Wt Readings from Last 3 Encounters:  02/26/20 227 lb (103 kg)  02/22/20 230 lb (104.3 kg)  11/08/19 231 lb (104.8 kg)     Constitutional:      Appearance: Healthy appearance. Not in distress.  Neck:     Thyroid: No thyromegaly.     Vascular: JVD normal.  Pulmonary:     Effort: Pulmonary effort is normal.     Breath sounds: No wheezing. No rales.  Cardiovascular:     Normal rate. Regular rhythm. Normal S1. Normal S2.     Murmurs: There is no murmur.  Edema:    Pretibial: bilateral trace edema of the pretibial area. Abdominal:      Palpations: Abdomen is soft.  Skin:    General: Skin is warm and dry.  Neurological:     Mental Status: Alert and oriented to person, place and time.     Cranial Nerves: Cranial nerves are intact.       ASSESSMENT & PLAN:    1. Chronic diastolic heart failure ([place]) She has a history of diastolic dysfunction on prior echocardiogram.  She  has recently been evaluated for significant muscle weakness and elevated CK.  She is being referred to general surgery for muscle biopsy.  She had gone to the emergency room earlier this month.  Chest x-ray was negative for pulmonary edema and her BNP was normal.  Her exam does not suggest volume overload.  Continue current dose of HCTZ.  2. Essential hypertension Blood pressure is somewhat above goal.  However, given her current underlying illness, I do not recommend increasing her medications at this time.  Her elevated heart rate is in response to her significant muscle weakness.  Dr. Acie Fredrickson or I will see her back in 3 to 4 months for follow-up.  3. Myositis, unspecified myositis type, unspecified site As noted, ongoing work-up.  She has a referral pending to general surgery for muscle biopsy.  4. Other chest pain Chest pain seems to be musculoskeletal.  As noted, she has significant issues with skeletal muscle weakness and ongoing work-up.  No further testing is warranted at this time.  EKG does not demonstrate any acute findings.    Dispo:  Return in about 4 months (around 06/27/2020) for Routine Follow Up, w/ Dr. Acie Fredrickson, or Richardson Dopp, PA-C, in person.   Medication Adjustments/Labs and Tests Ordered: Current medicines are reviewed at length with the patient today.  Concerns regarding medicines are outlined above.  Tests Ordered: Orders Placed This Encounter  Procedures  . EKG 12-Lead   Medication Changes: No orders of the defined types were placed in this encounter.   Signed, Richardson Dopp, PA-C  02/26/2020 5:05 PM    [facility], [place], [place]  [address] Phone: [phone]; Fax: [phone]

## 2020-02-26 ENCOUNTER — Encounter: Payer: Self-pay | Admitting: Physician Assistant

## 2020-02-26 ENCOUNTER — Ambulatory Visit (INDEPENDENT_AMBULATORY_CARE_PROVIDER_SITE_OTHER): Payer: 59 | Admitting: Physician Assistant

## 2020-02-26 ENCOUNTER — Other Ambulatory Visit: Payer: Self-pay

## 2020-02-26 VITALS — BP 140/80 | HR 102 | Ht 63.0 in | Wt 227.0 lb

## 2020-02-26 DIAGNOSIS — M609 Myositis, unspecified: Secondary | ICD-10-CM | POA: Diagnosis not present

## 2020-02-26 DIAGNOSIS — I5032 Chronic diastolic (congestive) heart failure: Secondary | ICD-10-CM | POA: Diagnosis not present

## 2020-02-26 DIAGNOSIS — R0789 Other chest pain: Secondary | ICD-10-CM | POA: Diagnosis not present

## 2020-02-26 DIAGNOSIS — I10 Essential (primary) hypertension: Secondary | ICD-10-CM

## 2020-02-26 NOTE — Patient Instructions (Addendum)
Medication Instructions:   Your physician recommends that you continue on your current medications as directed. Please refer to the Current Medication list given to you today.  *If you need a refill on your cardiac medications before your next appointment, please call your pharmacy*  Lab Work:  None ordered today  Testing/Procedures:  None ordered today  Follow-Up: At [facility], you and your health needs are our priority.  As part of our continuing mission to provide you with exceptional heart care, we have created designated Provider Care Teams.  These Care Teams include your primary Cardiologist (physician) and Advanced Practice Providers (APPs -  Physician Assistants and Nurse Practitioners) who all work together to provide you with the care you need, when you need it.  On 06/10/20 at 4:20PM with Mertie Moores, MD

## 2020-03-04 ENCOUNTER — Ambulatory Visit: Payer: 59 | Admitting: Internal Medicine

## 2020-03-26 ENCOUNTER — Telehealth: Payer: Self-pay | Admitting: *Deleted

## 2020-03-26 ENCOUNTER — Ambulatory Visit: Payer: Self-pay | Admitting: General Surgery

## 2020-03-26 NOTE — Telephone Encounter (Signed)
° °  [facility] Pre-operative Risk Assessment    HEARTCARE STAFF: - Please ensure there is not already an duplicate clearance open for this procedure. - Under Visit Info/Reason for Call, type in Other and utilize the format Clearance MM/DD/YY or Clearance TBD. Do not use dashes or single digits. - If request is for dental extraction, please clarify the # of teeth to be extracted.  Request for surgical clearance:  1. What type of surgery is being performed? MUSCLE BIOPSY   2. When is this surgery scheduled? TBD   3. What type of clearance is required (medical clearance vs. Pharmacy clearance to hold med vs. Both)? MEDICAL  4. Are there any medications that need to be held prior to surgery and how long? ASA    5. Practice name and name of physician performing surgery? [facility]; DR. PAUL TOTH   6. What is the office phone number? [phone]   7.   What is the office fax number? [place]: [place], CMA  8.   Anesthesia type (None, local, MAC, general) ? GENERAL   Julaine Hua 03/26/2020, 4:53 PM  _________________________________________________________________   (provider comments below)

## 2020-03-27 ENCOUNTER — Ambulatory Visit: Payer: 59 | Admitting: Internal Medicine

## 2020-03-27 NOTE — Telephone Encounter (Signed)
   Primary Cardiologist: Mertie Moores, MD  Chart reviewed as part of pre-operative protocol coverage. Given past medical history and time since last visit, based on ACC/AHA guidelines, Calia Napp would be at acceptable risk for the planned procedure without further cardiovascular testing.   Her aspirin may be held for 7 days prior to her procedure.  Please resume as soon as hemostasis is achieved.  I will route this recommendation to the requesting party via Epic fax function and remove from pre-op pool.  Please call with questions.  Jossie Ng. Dafney Farler NP-C    03/27/2020, 8:51 AM [place] [address] Office [phone] Fax [phone]

## 2020-03-28 ENCOUNTER — Encounter: Payer: Self-pay | Admitting: Internal Medicine

## 2020-03-28 ENCOUNTER — Other Ambulatory Visit: Payer: Self-pay

## 2020-03-28 ENCOUNTER — Ambulatory Visit (INDEPENDENT_AMBULATORY_CARE_PROVIDER_SITE_OTHER): Payer: 59 | Admitting: Internal Medicine

## 2020-03-28 ENCOUNTER — Other Ambulatory Visit (HOSPITAL_COMMUNITY)
Admission: RE | Admit: 2020-03-28 | Discharge: 2020-03-28 | Disposition: A | Discharge status: Home / Self Care | Payer: 59 | Source: Ambulatory Visit | Attending: Internal Medicine | Admitting: Internal Medicine

## 2020-03-28 VITALS — BP 125/78 | HR 113 | Temp 97.3°F | Resp 17 | Ht 63.0 in | Wt 217.0 lb

## 2020-03-28 DIAGNOSIS — Z1231 Encounter for screening mammogram for malignant neoplasm of breast: Secondary | ICD-10-CM | POA: Diagnosis not present

## 2020-03-28 DIAGNOSIS — Z1211 Encounter for screening for malignant neoplasm of colon: Secondary | ICD-10-CM | POA: Diagnosis not present

## 2020-03-28 DIAGNOSIS — Z124 Encounter for screening for malignant neoplasm of cervix: Secondary | ICD-10-CM | POA: Diagnosis not present

## 2020-03-28 DIAGNOSIS — Z0000 Encounter for general adult medical examination without abnormal findings: Secondary | ICD-10-CM | POA: Diagnosis not present

## 2020-03-28 NOTE — Progress Notes (Signed)
Subjective:    Kristine Bauer - 54 y.o. female MRN [id number]  Date of birth: 1966/07/26  HPI  Kristine Bauer is here for annual physical exam. She has had continued proximal muscle weakness. Saw general surgery this week and they are scheduling her for a muscle biopsy. Otherwise, no concerns. Declines STD testing.      Health Maintenance:  Health Maintenance Due  Topic Date Due  . Hepatitis C Screening  Never done  . PNEUMOCOCCAL POLYSACCHARIDE VACCINE AGE 64-64 HIGH RISK  Never done  . FOOT EXAM  Never done  . OPHTHALMOLOGY EXAM  Never done  . COVID-19 Vaccine (1) Never done  . TETANUS/TDAP  Never done  . PAP SMEAR-Modifier  Never done  . COLONOSCOPY  Never done    -  reports that she has never smoked. She has never used smokeless tobacco. - Review of Systems: Per HPI. - Past Medical History: Patient Active Problem List   Diagnosis Date Noted  . Chronic diastolic heart failure ([place]) 01/01/2020  . Leg edema 11/08/2019  . Proximal leg weakness 08/12/2018  . ETD (Eustachian tube dysfunction), bilateral 04/20/2017  . Nasal turbinate hypertrophy 04/20/2017  . Seasonal allergic rhinitis 04/20/2017  . Diabetes mellitus, type 2 ([place])   . Chest pain of uncertain etiology   . HTN (hypertension)   . Asthma   . GERD (gastroesophageal reflux disease)   . Anemia    - Medications: reviewed and updated   Objective:   Physical Exam Temp (!) 97.3 F (36.3 C) (Temporal)   Resp 17   Wt (!) 217 lb (98.4 kg)   BMI 38.44 kg/m  Physical Exam Constitutional:      Appearance: She is not diaphoretic.  HENT:     Head: Normocephalic and atraumatic.  Eyes:     Conjunctiva/sclera: Conjunctivae normal.     Pupils: Pupils are equal, round, and reactive to light.  Neck:     Thyroid: No thyromegaly.  Cardiovascular:     Rate and Rhythm: Normal rate and regular rhythm.     Heart sounds: Normal heart sounds. No murmur heard.   Pulmonary:     Effort: Pulmonary effort is  normal. No respiratory distress.     Breath sounds: Normal breath sounds. No wheezing.  Abdominal:     General: Bowel sounds are normal. There is no distension.     Palpations: Abdomen is soft.     Tenderness: There is no abdominal tenderness. There is no guarding or rebound.  Genitourinary:    Comments: GU/GYN: Exam performed in the presence of a chaperone. External genitalia within normal limits.  Vaginal mucosa pink, moist, normal rugae.  Nonfriable cervix without lesions, no discharge or bleeding noted on speculum exam.  Bimanual exam revealed normal, nongravid uterus.  No cervical motion tenderness. No adnexal masses bilaterally.    Musculoskeletal:        General: No deformity. Normal range of motion.     Cervical back: Normal range of motion and neck supple.  Lymphadenopathy:     Cervical: No cervical adenopathy.  Skin:    General: Skin is warm and dry.     Findings: No rash.  Neurological:     Mental Status: She is alert and oriented to person, place, and time.     Gait: Gait is intact.  Psychiatric:        Mood and Affect: Mood and affect normal.        Judgment: Judgment normal.  Assessment & Plan:   1. Annual physical exam Counseled on 150 minutes of exercise per week, healthy eating (including decreased daily intake of saturated fats, cholesterol, added sugars, sodium), STI prevention, routine healthcare maintenance.  2. Pap smear for cervical cancer screening - Cytology - PAP([facility])  3. Breast cancer screening by mammogram - MM Digital Screening; Future  4. Colon cancer screening - Ambulatory referral to Gastroenterology   Phill Myron, D.O. 03/28/2020, 3:00 PM Primary Care at [facility]

## 2020-04-01 LAB — CYTOLOGY - PAP
Comment: NEGATIVE
Diagnosis: NEGATIVE
High risk HPV: NEGATIVE

## 2020-04-05 IMAGING — RF DG ESOPHAGUS
7 series · 19 of 24 positions shown · non-contrast
Comparison: None.

CLINICAL DATA: Esophageal dysphagia. Sensation of food sticking in
mid esophagus.

EXAM:
ESOPHOGRAM / BARIUM SWALLOW / BARIUM TABLET STUDY
TECHNIQUE: Combined double contrast and single contrast examination performed
using effervescent crystals, thick barium liquid, and thin barium
liquid. The patient was observed with fluoroscopy swallowing a 13 mm
barium sulphate tablet.
FLUOROSCOPY TIME:  Fluoroscopy Time:  1 minutes 42 seconds
Radiation Exposure Index (if provided by the fluoroscopic device):
Number of Acquired Spot Images: 0

[Series 1: cp_standard · 0.34mm/px · 3 of 83 frames shown (1 of 7)]
[frame 13/83]
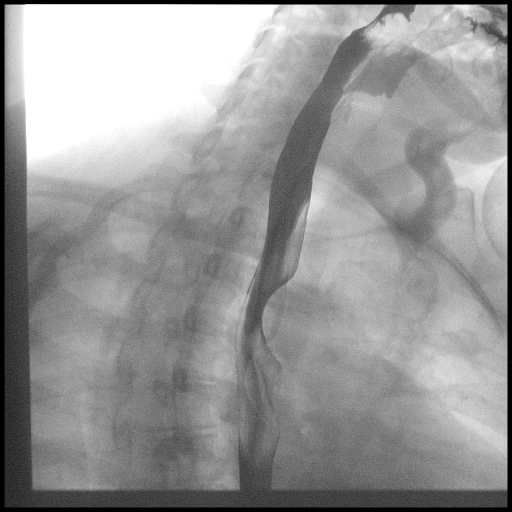
[frame 42/83]
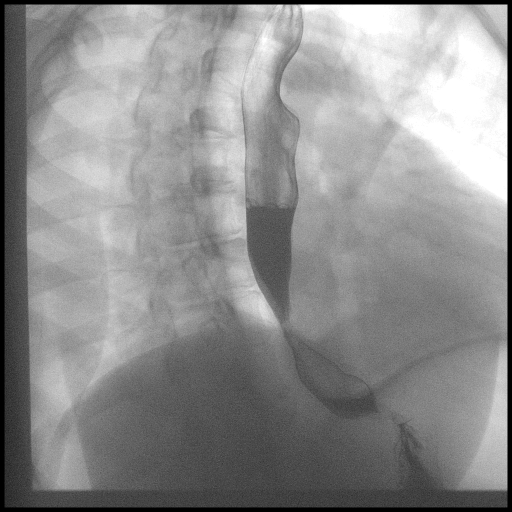
[frame 71/83]
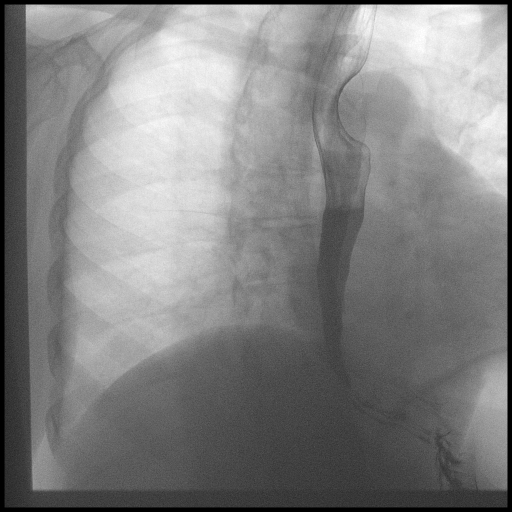

[Series 2: cp_standard · 0.35mm/px · 2 of 34 frames shown (2 of 7)]
[frame 18/34]
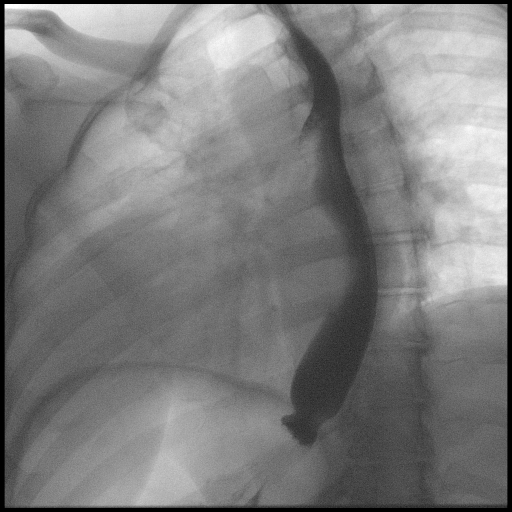
[frame 29/34]
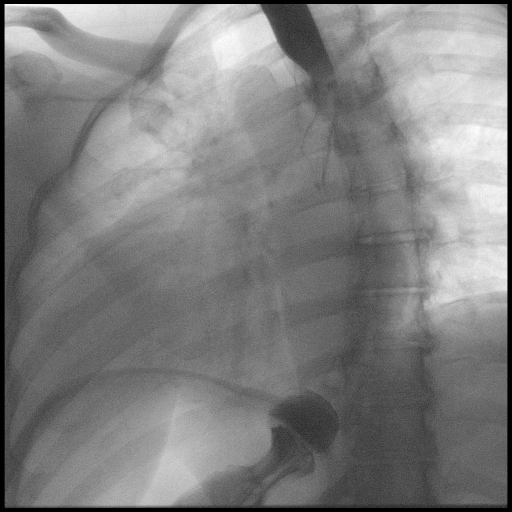

[Series 3: cp_standard · 0.34mm/px · 3 of 30 frames shown (3 of 7)]
[frame 5/30]
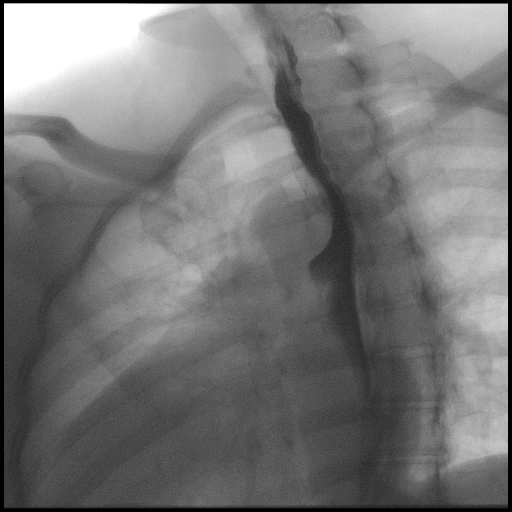
[frame 26/30]
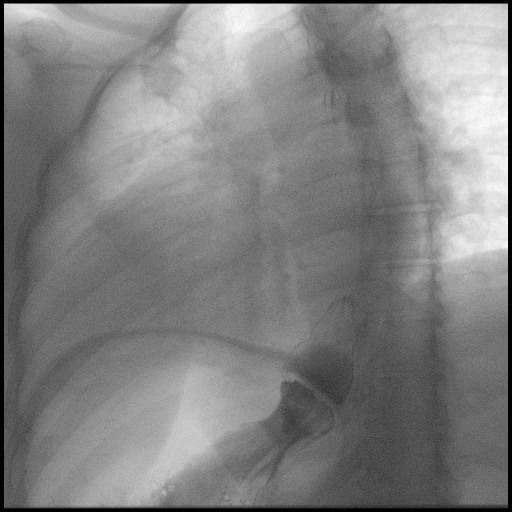
[frame 29/30]
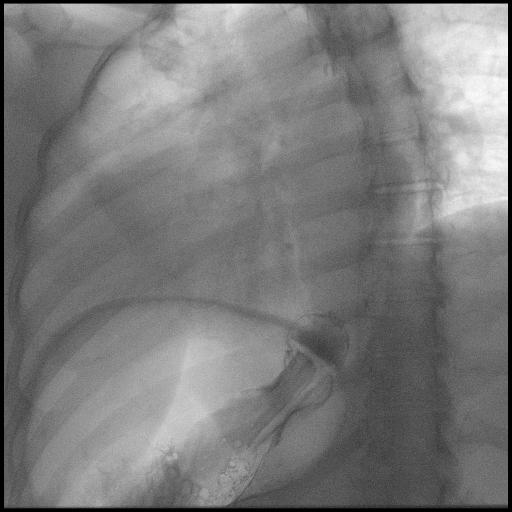

[Series 4: cp_standard · 0.34mm/px · 2 of 99 frames shown (4 of 7)]
[frame 1/99]
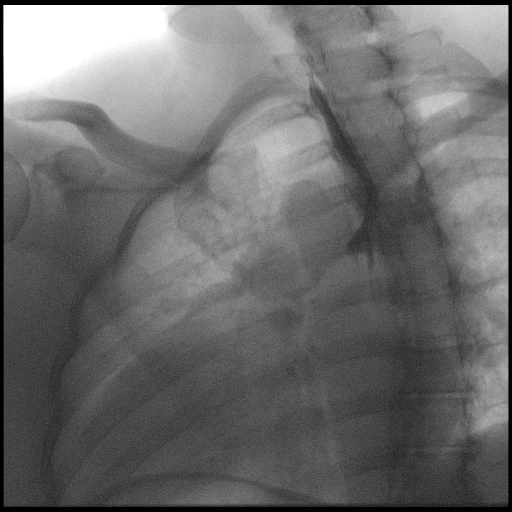
[frame 85/99]
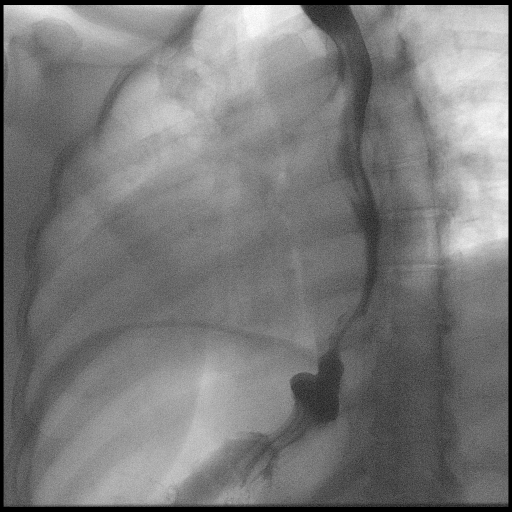

[Series 5: cp_standard · 0.34mm/px · 3 of 93 frames shown (5 of 7)]
[frame 14/93]
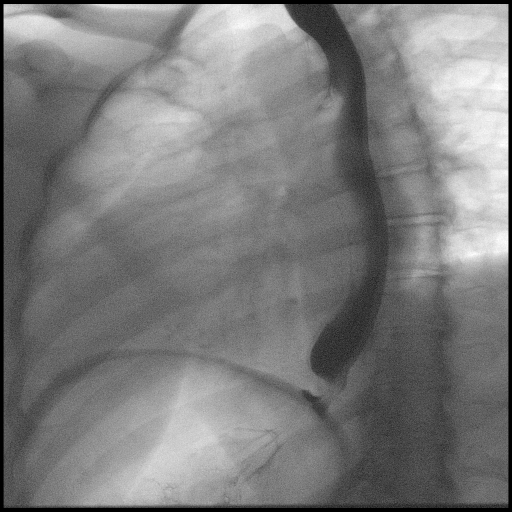
[frame 20/93]
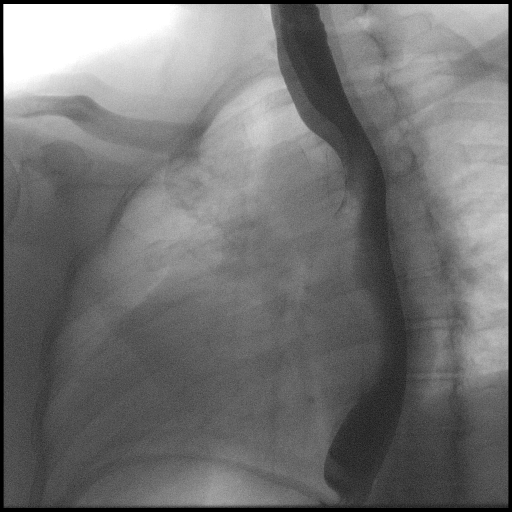
[frame 47/93]
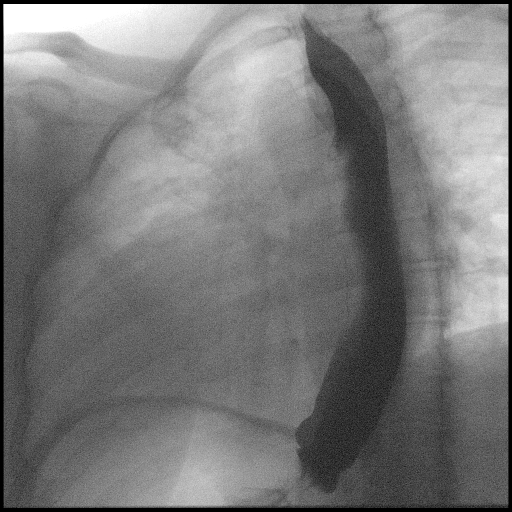

[Series 6: cp_standard · 0.34mm/px · 3 of 121 frames shown (6 of 7)]
[frame 19/121]
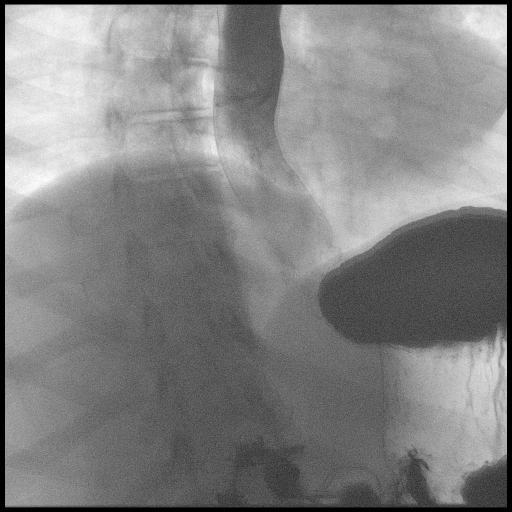
[frame 61/121]
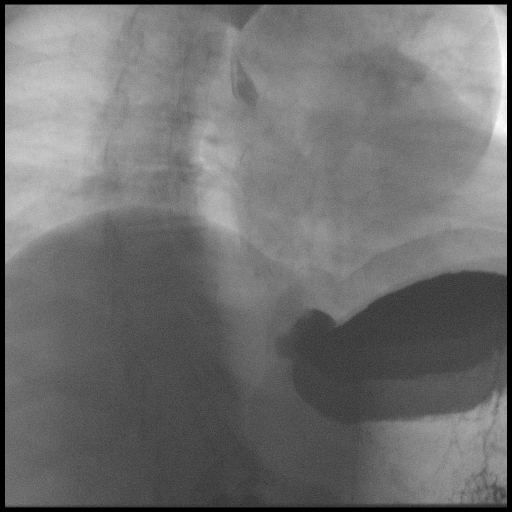
[frame 63/121]
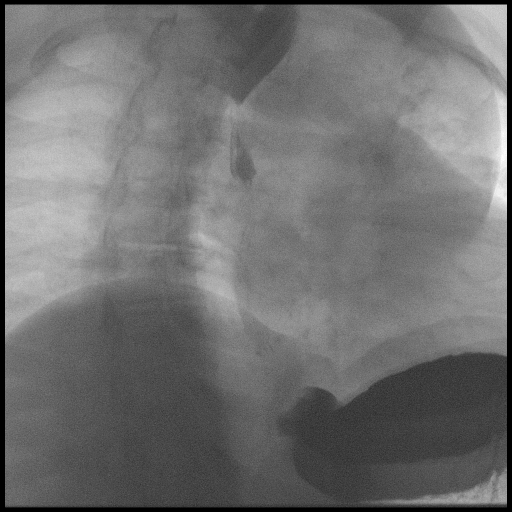

[Series 7: cp_standard · 0.34mm/px · 3 of 42 frames shown (7 of 7)]
[frame 6/42]
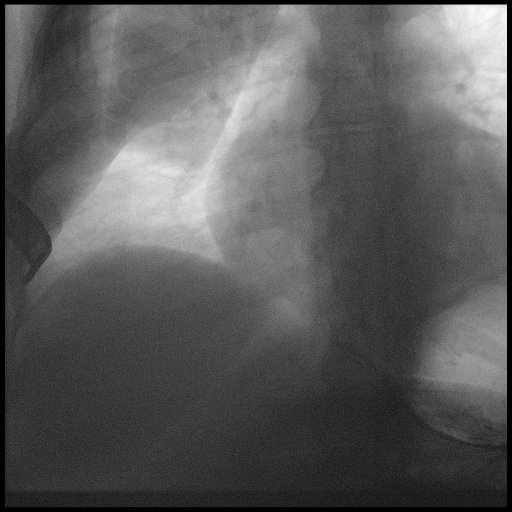
[frame 22/42]
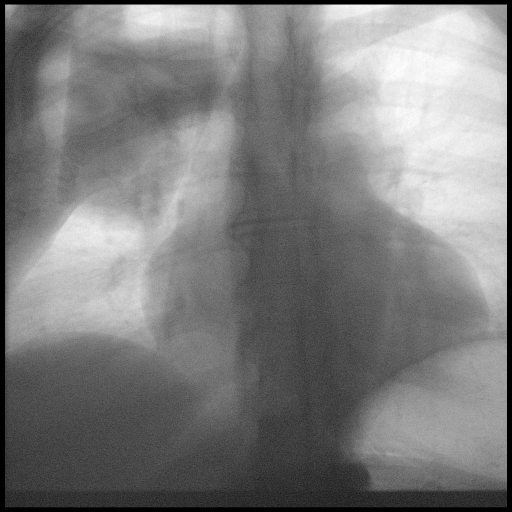
[frame 36/42]
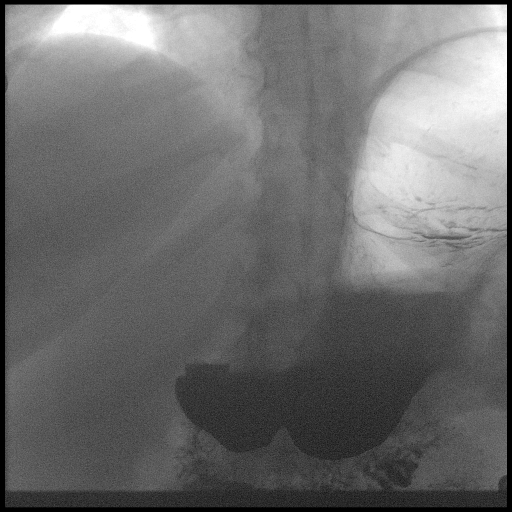

[19 of 24 positions shown; findings below may reference images not displayed]

FINDINGS: Fluoroscopic evaluation of swallowing demonstrates disruption of [DATE]
primary esophageal peristaltic waves. Occasional tertiary
contractions.

There is a small hiatal hernia. No fixed stricture, fold thickening
or mass. A small amount of reflux noted with the water siphon
maneuver. The 13 mm barium tablet freely passes into the stomach.
IMPRESSION: Esophageal motility disorder with disruption of primary waves and
occasional tertiary contractions.

Small hiatal hernia.

## 2020-04-15 ENCOUNTER — Telehealth: Payer: Self-pay | Admitting: Internal Medicine

## 2020-04-15 NOTE — Telephone Encounter (Signed)
Pt called asking for a letter saying she is disable

## 2020-04-16 NOTE — Telephone Encounter (Signed)
I can not do a letter saying that patient is disabled if she means that she is trying to file for disability. For that, she needs to see a disability doctor. If she needs help with FMLA paperwork or other temporary leave we can help with that.  Phill Myron, D.O. Primary Care at [facility]  04/16/2020, 6:41 PM

## 2020-04-17 NOTE — Telephone Encounter (Signed)
Called the pt she understood that she couldn't get a letter

## 2020-04-18 ENCOUNTER — Other Ambulatory Visit: Payer: Self-pay | Admitting: Cardiovascular Disease

## 2020-04-18 DIAGNOSIS — R600 Localized edema: Secondary | ICD-10-CM

## 2020-04-18 DIAGNOSIS — I10 Essential (primary) hypertension: Secondary | ICD-10-CM

## 2020-04-19 ENCOUNTER — Other Ambulatory Visit: Payer: Self-pay | Admitting: Cardiovascular Disease

## 2020-04-19 ENCOUNTER — Other Ambulatory Visit: Payer: Self-pay

## 2020-04-19 DIAGNOSIS — I10 Essential (primary) hypertension: Secondary | ICD-10-CM

## 2020-04-19 DIAGNOSIS — R600 Localized edema: Secondary | ICD-10-CM

## 2020-04-19 DIAGNOSIS — E119 Type 2 diabetes mellitus without complications: Secondary | ICD-10-CM

## 2020-04-19 MED ORDER — MONTELUKAST SODIUM 10 MG PO TABS: 10.0000 mg | ORAL_TABLET | Freq: Every day | ORAL | 0 refills | Status: DC

## 2020-04-19 MED ORDER — GLIMEPIRIDE 4 MG PO TABS: 4.0000 mg | ORAL_TABLET | Freq: Two times a day (BID) | ORAL | 0 refills | Status: DC

## 2020-04-19 MED ORDER — AMLODIPINE BESYLATE 5 MG PO TABS: 5.0000 mg | ORAL_TABLET | Freq: Every day | ORAL | 3 refills | Status: DC

## 2020-04-19 MED ORDER — METFORMIN HCL ER 500 MG PO TB24: 1500.0000 mg | ORAL_TABLET | Freq: Every day | ORAL | 0 refills | Status: DC

## 2020-04-19 MED ORDER — PANTOPRAZOLE SODIUM 40 MG PO TBEC: 40.0000 mg | DELAYED_RELEASE_TABLET | Freq: Every day | ORAL | 0 refills | Status: DC

## 2020-04-19 NOTE — Telephone Encounter (Signed)
Error

## 2020-05-13 ENCOUNTER — Ambulatory Visit
Admission: EM | Admit: 2020-05-13 | Discharge: 2020-05-13 | Disposition: A | Discharge status: Home / Self Care | Payer: 59 | Attending: Family Medicine | Admitting: Family Medicine

## 2020-05-13 ENCOUNTER — Other Ambulatory Visit: Payer: Self-pay

## 2020-05-13 ENCOUNTER — Encounter: Payer: Self-pay | Admitting: Emergency Medicine

## 2020-05-13 DIAGNOSIS — G44309 Post-traumatic headache, unspecified, not intractable: Secondary | ICD-10-CM | POA: Diagnosis not present

## 2020-05-13 DIAGNOSIS — W19XXXA Unspecified fall, initial encounter: Secondary | ICD-10-CM

## 2020-05-13 MED ORDER — DICLOFENAC SODIUM 50 MG PO TBEC: 50.0000 mg | DELAYED_RELEASE_TABLET | Freq: Two times a day (BID) | ORAL | 0 refills | Status: DC

## 2020-05-13 MED ORDER — TIZANIDINE HCL 4 MG PO TABS
4.0000 mg | ORAL_TABLET | Freq: Every day | ORAL | 0 refills | Status: DC
Start: 2020-05-13 — End: 2020-06-03

## 2020-05-13 NOTE — ED Provider Notes (Signed)
[facility]    CSN: [id number] Arrival date & time: 05/13/20  1642      History   Chief Complaint Chief Complaint  Patient presents with  . Fall  . Headache    HPI Kristine Bauer is a 54 y.o. female.   HPI Patient reports two falls over the last two weeks on the bus. Most recent 5 days ago. She endorses an ongoing headache and scalp tenderness. She was not evaluated by ER or PCP. She reports the headache is all over and radiating down to her neck and back. She has taken tylenol without relief. Denies any nausea, vomiting, blurring of vision. She did not loose consciences post fall.  Past Medical History:  Diagnosis Date  . Allergic rhinitis   . Anemia   . Asthma   . Chest pain    LEFT SIDED-SEVERAL YEARS  . Chronic diastolic heart failure ([place]) 01/01/2020  . Diabetes mellitus, type 2 ([place])   . ETD (Eustachian tube dysfunction), bilateral 04/20/2017  . GERD (gastroesophageal reflux disease)   . HTN (hypertension)   . Hyperlipidemia   . Nasal turbinate hypertrophy 04/20/2017  . Vitamin D deficiency     Patient Active Problem List   Diagnosis Date Noted  . Chronic diastolic heart failure ([place]) 01/01/2020  . Leg edema 11/08/2019  . Proximal leg weakness 08/12/2018  . ETD (Eustachian tube dysfunction), bilateral 04/20/2017  . Nasal turbinate hypertrophy 04/20/2017  . Seasonal allergic rhinitis 04/20/2017  . Diabetes mellitus, type 2 ([place])   . Chest pain of uncertain etiology   . HTN (hypertension)   . Asthma   . GERD (gastroesophageal reflux disease)   . Anemia     Past Surgical History:  Procedure Laterality Date  . ENDOMETRIAL BIOPSY      OB History   No obstetric history on file.      Home Medications    Prior to Admission medications   Medication Sig Start Date End Date Taking? Authorizing Provider  albuterol (VENTOLIN HFA) 108 (90 Base) MCG/ACT inhaler Inhale 1-2 puffs into the lungs every 6 (six) hours as needed for wheezing or  shortness of breath. 01/04/20   Nicolette Bang, DO  amLODipine (NORVASC) 5 MG tablet Take 1 tablet (5 mg total) by mouth daily. 04/19/20   Nahser, Wonda Cheng, MD  aspirin EC 81 MG tablet Take 81 mg by mouth daily.    [provider]  azelastine (OPTIVAR) 0.05 % ophthalmic solution  12/22/19   [provider]  BEPREVE 1.5 % SOLN Place 1 drop into both eyes 2 (two) times daily. 12/18/19   [provider]  Cholecalciferol (VITAMIN D) 50 MCG (2000 UT) tablet Take 1 tablet (2,000 Units total) by mouth daily. 01/11/20   Charlott Rakes, MD  fluticasone (FLONASE) 50 MCG/ACT nasal spray Place 2 sprays into both nostrils daily. 01/01/20   Nicolette Bang, DO  gabapentin (NEURONTIN) 300 MG capsule Take 1 capsule (300 mg total) by mouth 3 (three) times daily. 09/14/19   Hyatt, Max T, DPM  glimepiride (AMARYL) 4 MG tablet Take 1 tablet (4 mg total) by mouth 2 (two) times daily. 04/19/20   Nicolette Bang, DO  glucose blood (PRODIGY NO CODING BLOOD GLUC) test strip Use as instructed to check blood sugar twice daily. E11.8 01/25/20   Charlott Rakes, MD  hydrochlorothiazide (HYDRODIURIL) 25 MG tablet Take 1 tablet (25 mg total) by mouth daily. 02/15/20   Nicolette Bang, DO  hydrOXYzine (VISTARIL) 25 MG  capsule Take 1 capsule (25 mg total) by mouth at bedtime as needed. 02/15/20   Nicolette Bang, DO  insulin glargine (LANTUS SOLOSTAR) 100 UNIT/ML Solostar Pen Inject 10 Units into the skin daily. 01/08/20   Charlott Rakes, MD  Insulin Pen Needle (TRUEPLUS PEN NEEDLES) 32G X 4 MM MISC Use as instructed to inject insulin daily. 01/15/20   Charlott Rakes, MD  Lancets (ACCU-CHEK MULTICLIX) lancets 1 each by Other route 3 (three) times daily. 03/19/17   [provider]  metFORMIN (GLUCOPHAGE-XR) 500 MG 24 hr tablet Take 3 tablets (1,500 mg total) by mouth daily with breakfast. 04/19/20   Nicolette Bang, DO  montelukast (SINGULAIR) 10 MG  tablet Take 1 tablet (10 mg total) by mouth at bedtime. 04/19/20   Nicolette Bang, DO  Multiple Vitamins-Iron (DAILY MULTIVITAMINS/IRON PO) Take 1 tablet by mouth daily.     [provider]  pantoprazole (PROTONIX) 40 MG tablet Take 1 tablet (40 mg total) by mouth daily. 04/19/20   Nicolette Bang, DO  sitaGLIPtin (JANUVIA) 100 MG tablet Take 100 mg by mouth daily.      [provider]  valsartan (DIOVAN) 160 MG tablet TAKE 1 TABLET BY MOUTH EVERY DAY 04/18/20   Nahser, Wonda Cheng, MD    Family History Family History  Problem Relation Age of Onset  . Hypertension Father   . Glaucoma Father   . Hypertension Mother   . Diabetes type II Mother   . Multiple sclerosis Sister   . Breast cancer Neg Hx     Social History Social History   Tobacco Use  . Smoking status: Never Smoker  . Smokeless tobacco: Never Used  Vaping Use  . Vaping Use: Never used  Substance Use Topics  . Alcohol use: No  . Drug use: Never     Allergies   Statins   Review of Systems Review of Systems Pertinent negatives listed in HPI  Physical Exam Triage Vital Signs ED Triage Vitals  Enc Vitals Group     BP 05/13/20 1911 (!) 144/75     Pulse Rate 05/13/20 1911 (!) 107     Resp 05/13/20 1911 20     Temp 05/13/20 1911 98.2 F (36.8 C)     Temp Source 05/13/20 1911 Oral     SpO2 05/13/20 1911 99 %     Weight --      Height --      Head Circumference --      Peak Flow --      Pain Score 05/13/20 1908 8     Pain Loc --      Pain Edu? --      Excl. in [place]? --    No data found.  Updated Vital Signs BP (!) 144/75 (BP Location: Left Arm)   Pulse (!) 107   Temp 98.2 F (36.8 C) (Oral)   Resp 20   SpO2 99%   Visual Acuity Right Eye Distance:   Left Eye Distance:   Bilateral Distance:    Right Eye Near:   Left Eye Near:    Bilateral Near:     Physical Exam Vitals and nursing note reviewed.  Constitutional:      Appearance: She is well-developed.    Eyes:     General: No visual field deficit.    Extraocular Movements: Extraocular movements intact.     Pupils: Pupils are equal, round, and reactive to light.  Cardiovascular:     Rate and Rhythm:  Regular rhythm. Tachycardia present.  Pulmonary:     Effort: Pulmonary effort is normal.     Breath sounds: Normal breath sounds.  Skin:    General: Skin is warm and dry.  Neurological:     Mental Status: She is alert.     GCS: GCS eye subscore is 4. GCS verbal subscore is 5. GCS motor subscore is 6.     Cranial Nerves: No facial asymmetry.     Motor: No weakness.     Coordination: Coordination normal.  Psychiatric:        Mood and Affect: Mood is anxious.      UC Treatments / Results  Labs (all labs ordered are listed, but only abnormal results are displayed) Labs Reviewed - No data to display  EKG   Radiology No results found.  Procedures Procedures (including critical care time)  Medications Ordered in UC Medications - No data to display  Initial Impression / Assessment and Plan / UC Course  I have reviewed the triage vital signs and the nursing notes.  Pertinent labs & imaging results that were available during my care of the patient were reviewed by me and considered in my medical decision making (see chart for details).    No focal abnormalities noted on exam. Given time lapse between the fall and today, if acute neurological symptoms were present, these symptoms would have presented by now. Advised if additional imaging such as CT should would need to schedule follow-up with PCP. No neurological abnormalities present on exam today. Recommend management of headache pain and back pain. Trial treatment per orders. If no improvement within 3-5 days follow-up with PCP Final Clinical Impressions(s) / UC Diagnoses   Final diagnoses:  Post-traumatic headache, not intractable, unspecified chronicity pattern  Fall, initial encounter   Discharge Instructions   None     ED Prescriptions    Medication Sig Dispense Auth. Provider   diclofenac (VOLTAREN) 50 MG EC tablet Take 1 tablet (50 mg total) by mouth 2 (two) times daily. 30 tablet Scot Jun, FNP   tiZANidine (ZANAFLEX) 4 MG tablet Take 1 tablet (4 mg total) by mouth at bedtime. 20 tablet Scot Jun, FNP     PDMP not reviewed this encounter.   Scot Jun, [place] 05/18/20 [phone]

## 2020-05-13 NOTE — ED Triage Notes (Signed)
Patient reports falling on bus x 2 in the last 2 weeks.  Patient has headaches.  Denies loc .  Patient has not had medical attention.

## 2020-06-03 ENCOUNTER — Other Ambulatory Visit: Payer: Self-pay

## 2020-06-03 ENCOUNTER — Encounter: Payer: Self-pay | Admitting: Internal Medicine

## 2020-06-03 ENCOUNTER — Telehealth (INDEPENDENT_AMBULATORY_CARE_PROVIDER_SITE_OTHER): Payer: 59 | Admitting: Physician Assistant

## 2020-06-03 DIAGNOSIS — E1169 Type 2 diabetes mellitus with other specified complication: Secondary | ICD-10-CM

## 2020-06-03 DIAGNOSIS — Z1211 Encounter for screening for malignant neoplasm of colon: Secondary | ICD-10-CM

## 2020-06-03 DIAGNOSIS — E785 Hyperlipidemia, unspecified: Secondary | ICD-10-CM

## 2020-06-03 DIAGNOSIS — I10 Essential (primary) hypertension: Secondary | ICD-10-CM | POA: Diagnosis not present

## 2020-06-03 DIAGNOSIS — Z1159 Encounter for screening for other viral diseases: Secondary | ICD-10-CM | POA: Diagnosis not present

## 2020-06-03 DIAGNOSIS — E119 Type 2 diabetes mellitus without complications: Secondary | ICD-10-CM

## 2020-06-03 DIAGNOSIS — G44319 Acute post-traumatic headache, not intractable: Secondary | ICD-10-CM

## 2020-06-03 DIAGNOSIS — G4700 Insomnia, unspecified: Secondary | ICD-10-CM

## 2020-06-03 DIAGNOSIS — F439 Reaction to severe stress, unspecified: Secondary | ICD-10-CM

## 2020-06-03 MED ORDER — TIZANIDINE HCL 4 MG PO TABS: 4.0000 mg | ORAL_TABLET | Freq: Every day | ORAL | 0 refills | Status: DC

## 2020-06-03 MED ORDER — DICLOFENAC SODIUM 50 MG PO TBEC: 50.0000 mg | DELAYED_RELEASE_TABLET | Freq: Two times a day (BID) | ORAL | 0 refills | Status: DC

## 2020-06-03 MED ORDER — HYDROXYZINE PAMOATE 25 MG PO CAPS: 25.0000 mg | ORAL_CAPSULE | Freq: Every evening | ORAL | 2 refills | Status: DC | PRN

## 2020-06-03 MED ORDER — HYDROCHLOROTHIAZIDE 25 MG PO TABS: 25.0000 mg | ORAL_TABLET | Freq: Every day | ORAL | 1 refills | Status: AC

## 2020-06-03 NOTE — Progress Notes (Signed)
Established Patient Office Visit  Subjective:  Patient ID: Kristine Bauer, female    DOB: [birth year]  Age: 54 y.o. MRN: [id number]  CC:  Chief Complaint  Patient presents with  . Diabetes    states that FSBS readings have been high due to stress eating. has lab appt on 10/11  . Hypertension  . Hyperlipidemia     Virtual Visit via Telephone Note  I connected with Kristine Bauer on 06/04/20 at  3:50 PM EDT by telephone and verified that I am speaking with the correct person using two identifiers.  Location: Patient: Home Provider: Working remotely from home   I discussed the limitations, risks, security and privacy concerns of performing an evaluation and management service by telephone and the availability of in person appointments. I also discussed with the patient that there may be a patient responsible charge related to this service. The patient expressed understanding and agreed to proceed.   History of Present Illness: States that she has been having elevated BG readings, states they will run 300 - 400.  States that she has been eating a lot of chocolate due to stressors and knows that she shouldn't be doing that. Reports that she continues to have swelling in the lower legs and feet.  Endorses that she is taking her HTN meds as directed, is not drinking much water and is not watching her sodium intake  States that her sleep schedule has been off.  States that she will have a had time sleeping, than nap during the day.  Uses hydroxyzine occasionally with some relief of insomnia.  Reports that she continues to have headaches and neck pain from her two recent falls.  States that she was seen at [facility] on 05/13/20 for this and was given diclofenac and a muscle relaxer.  Reports that these have been very helpful, request refill.        Observations/Objective: Medical history and current medications reviewed, no physical exam completed     Past Medical History:   Diagnosis Date  . Allergic rhinitis   . Anemia   . Asthma   . Chest pain    LEFT SIDED-SEVERAL YEARS  . Chronic diastolic heart failure ([place]) 01/01/2020  . Diabetes mellitus, type 2 ([place])   . ETD (Eustachian tube dysfunction), bilateral 04/20/2017  . GERD (gastroesophageal reflux disease)   . HTN (hypertension)   . Hyperlipidemia   . Nasal turbinate hypertrophy 04/20/2017  . Vitamin D deficiency     Past Surgical History:  Procedure Laterality Date  . ENDOMETRIAL BIOPSY      Family History  Problem Relation Age of Onset  . Hypertension Father   . Glaucoma Father   . Hypertension Mother   . Diabetes type II Mother   . Multiple sclerosis Sister   . Breast cancer Neg Hx     Social History   Socioeconomic History  . Marital status: Single    Spouse name: Not on file  . Number of children: 0  . Years of education: 16  . Highest education level: Bachelor's degree (e.g., BA, AB, BS)  Occupational History  . Occupation: Sales executive: [employer]  Tobacco Use  . Smoking status: Never Smoker  . Smokeless tobacco: Never Used  Vaping Use  . Vaping Use: Never used  Substance and Sexual Activity  . Alcohol use: No  . Drug use: Never  . Sexual activity: Not Currently  Other Topics Concern  . Not on file  Social  History Narrative   Lives in an apartment on the 2nd floor.  No children.  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  Education: college.    Social Determinants of Health   Financial Resource Strain:   . Difficulty of Paying Living Expenses: Not on file  Food Insecurity:   . Worried About Charity fundraiser in the Last Year: Not on file  . Ran Out of Food in the Last Year: Not on file  Transportation Needs:   . Lack of Transportation (Medical): Not on file  . Lack of Transportation (Non-Medical): Not on file  Physical Activity:   . Days of Exercise per Week: Not on file  . Minutes of Exercise per Session: Not on file  Stress:   . Feeling of Stress : Not on  file  Social Connections:   . Frequency of Communication with Friends and Family: Not on file  . Frequency of Social Gatherings with Friends and Family: Not on file  . Attends Religious Services: Not on file  . Active Member of Clubs or Organizations: Not on file  . Attends Archivist Meetings: Not on file  . Marital Status: Not on file  Intimate Partner Violence:   . Fear of Current or Ex-Partner: Not on file  . Emotionally Abused: Not on file  . Physically Abused: Not on file  . Sexually Abused: Not on file    Outpatient Medications Prior to Visit  Medication Sig Dispense Refill  . albuterol (VENTOLIN HFA) 108 (90 Base) MCG/ACT inhaler Inhale 1-2 puffs into the lungs every 6 (six) hours as needed for wheezing or shortness of breath. 18 g 3  . amLODipine (NORVASC) 5 MG tablet Take 1 tablet (5 mg total) by mouth daily. 90 tablet 3  . aspirin EC 81 MG tablet Take 81 mg by mouth daily.    Marland Kitchen azelastine (OPTIVAR) 0.05 % ophthalmic solution     . BEPREVE 1.5 % SOLN Place 1 drop into both eyes 2 (two) times daily.    . Cholecalciferol (VITAMIN D) 50 MCG (2000 UT) tablet Take 1 tablet (2,000 Units total) by mouth daily. 90 tablet 1  . fluticasone (FLONASE) 50 MCG/ACT nasal spray Place 2 sprays into both nostrils daily. 16 g 6  . gabapentin (NEURONTIN) 300 MG capsule Take 1 capsule (300 mg total) by mouth 3 (three) times daily. 90 capsule 3  . glimepiride (AMARYL) 4 MG tablet Take 1 tablet (4 mg total) by mouth 2 (two) times daily. 180 tablet 0  . glucose blood (PRODIGY NO CODING BLOOD GLUC) test strip Use as instructed to check blood sugar twice daily. E11.8 100 each 12  . insulin glargine (LANTUS SOLOSTAR) 100 UNIT/ML Solostar Pen Inject 10 Units into the skin daily. 5 pen 3  . Insulin Pen Needle (TRUEPLUS PEN NEEDLES) 32G X 4 MM MISC Use as instructed to inject insulin daily. 100 each 11  . Lancets (ACCU-CHEK MULTICLIX) lancets 1 each by Other route 3 (three) times daily.    .  metFORMIN (GLUCOPHAGE-XR) 500 MG 24 hr tablet Take 3 tablets (1,500 mg total) by mouth daily with breakfast. 270 tablet 0  . montelukast (SINGULAIR) 10 MG tablet Take 1 tablet (10 mg total) by mouth at bedtime. 90 tablet 0  . Multiple Vitamins-Iron (DAILY MULTIVITAMINS/IRON PO) Take 1 tablet by mouth daily.     . pantoprazole (PROTONIX) 40 MG tablet Take 1 tablet (40 mg total) by mouth daily. 90 tablet 0  . sitaGLIPtin (JANUVIA) 100 MG  tablet Take 100 mg by mouth daily.      . valsartan (DIOVAN) 160 MG tablet TAKE 1 TABLET BY MOUTH EVERY DAY 90 tablet 1  . diclofenac (VOLTAREN) 50 MG EC tablet Take 1 tablet (50 mg total) by mouth 2 (two) times daily. 30 tablet 0  . hydrochlorothiazide (HYDRODIURIL) 25 MG tablet Take 1 tablet (25 mg total) by mouth daily. 90 tablet 1  . hydrOXYzine (VISTARIL) 25 MG capsule Take 1 capsule (25 mg total) by mouth at bedtime as needed. 30 capsule 2  . tiZANidine (ZANAFLEX) 4 MG tablet Take 1 tablet (4 mg total) by mouth at bedtime. 20 tablet 0   No facility-administered medications prior to visit.    Allergies  Allergen Reactions  . Statins Other (See Comments)    Myopathy    ROS Review of Systems    Objective:    Physical Exam  There were no vitals taken for this visit. Wt Readings from Last 3 Encounters:  03/28/20 (!) 217 lb (98.4 kg)  02/26/20 227 lb (103 kg)  02/22/20 230 lb (104.3 kg)     Health Maintenance Due  Topic Date Due  . Hepatitis C Screening  Never done  . PNEUMOCOCCAL POLYSACCHARIDE VACCINE AGE 25-64 HIGH RISK  Never done  . FOOT EXAM  Never done  . OPHTHALMOLOGY EXAM  Never done  . COVID-19 Vaccine (1) Never done  . TETANUS/TDAP  Never done  . COLONOSCOPY  Never done  . INFLUENZA VACCINE  04/07/2020    There are no preventive care reminders to display for this patient.  Lab Results  Component Value Date   TSH 1.560 02/09/2020   Lab Results  Component Value Date   WBC 9.9 02/09/2020   HGB 11.3 02/09/2020   HCT 37.8  02/09/2020   MCV 83 02/09/2020   PLT 348 02/09/2020   Lab Results  Component Value Date   NA 137 02/09/2020   K 4.1 02/09/2020   CO2 20 02/09/2020   GLUCOSE 350 (H) 02/09/2020   BUN 23 02/09/2020   CREATININE 0.78 02/09/2020   BILITOT <0.2 02/09/2020   ALKPHOS 84 02/09/2020   AST 118 (H) 02/09/2020   ALT 228 (H) 02/09/2020   PROT 7.3 02/09/2020   ALBUMIN 4.4 02/09/2020   CALCIUM 9.8 02/09/2020   ANIONGAP 8 02/10/2016   Lab Results  Component Value Date   CHOL 188 10/03/2019   Lab Results  Component Value Date   HDL 71 10/03/2019   Lab Results  Component Value Date   LDLCALC 105 (H) 10/03/2019   Lab Results  Component Value Date   TRIG 66 10/03/2019   Lab Results  Component Value Date   CHOLHDL 2.6 10/03/2019   Lab Results  Component Value Date   HGBA1C 13.2 (H) 01/04/2020      Assessment & Plan:   Problem List Items Addressed This Visit      Cardiovascular and Mediastinum   HTN (hypertension)   Relevant Medications   hydrochlorothiazide (HYDRODIURIL) 25 MG tablet     Endocrine   Diabetes mellitus, type 2 ([place]) - Primary   Relevant Orders   Hemoglobin A1c    Other Visit Diagnoses    Hyperlipidemia associated with type 2 diabetes mellitus ([place])       Need for hepatitis C screening test       Relevant Orders   HCV Ab w/Rflx to Verification   Colon cancer screening       Relevant Orders   Cologuard  Stress       Insomnia, unspecified type       Relevant Medications   hydrOXYzine (VISTARIL) 25 MG capsule   Acute post-traumatic headache, not intractable       Relevant Medications   diclofenac (VOLTAREN) 50 MG EC tablet   tiZANidine (ZANAFLEX) 4 MG tablet     Assessment and Plan: 1. Type 2 diabetes mellitus without complication, unspecified whether long term insulin use (HCC) Patient education given on stress reduction, consequences of elevated blood glucose levels.  Patient refuses referral for cognitive behavioral therapy, states that she  has sisters available to speak to.  Patient to come into office for labs. - Hemoglobin A1c; Future  2. Essential hypertension  - hydrochlorothiazide (HYDRODIURIL) 25 MG tablet; Take 1 tablet (25 mg total) by mouth daily.  Dispense: 90 tablet; Refill: 1  3. Hyperlipidemia associated with type 2 diabetes mellitus ([place])   4. Need for hepatitis C screening test  - HCV Ab w/Rflx to Verification; Future  5. Colon cancer screening  - Cologuard; Future  6. Stress Patient education given on stress management, improve sleep hygiene, increase water intake  7. Insomnia, unspecified type  - hydrOXYzine (VISTARIL) 25 MG capsule; Take 1 capsule (25 mg total) by mouth at bedtime as needed.  Dispense: 30 capsule; Refill: 2  8. Acute post-traumatic headache, not intractable  - diclofenac (VOLTAREN) 50 MG EC tablet; Take 1 tablet (50 mg total) by mouth 2 (two) times daily.  Dispense: 30 tablet; Refill: 0 - tiZANidine (ZANAFLEX) 4 MG tablet; Take 1 tablet (4 mg total) by mouth at bedtime.  Dispense: 20 tablet; Refill: 0   Follow Up Instructions:    I discussed the assessment and treatment plan with the patient. The patient was provided an opportunity to ask questions and all were answered. The patient agreed with the plan and demonstrated an understanding of the instructions.   The patient was advised to call back or seek an in-person evaluation if the symptoms worsen or if the condition fails to improve as anticipated.  I provided 21 minutes of non-face-to-face time during this encounter.   Kelvin Sennett S Mayers, PA-C   Meds ordered this encounter  Medications  . diclofenac (VOLTAREN) 50 MG EC tablet    Sig: Take 1 tablet (50 mg total) by mouth 2 (two) times daily.    Dispense:  30 tablet    Refill:  0    Order Specific Question:   Supervising Provider    Answer:   Joya Gaskins, PATRICK E [[id number]]  . tiZANidine (ZANAFLEX) 4 MG tablet    Sig: Take 1 tablet (4 mg total) by mouth at bedtime.     Dispense:  20 tablet    Refill:  0    Order Specific Question:   Supervising Provider    Answer:   Asencion Noble E [[id number]]  . hydrochlorothiazide (HYDRODIURIL) 25 MG tablet    Sig: Take 1 tablet (25 mg total) by mouth daily.    Dispense:  90 tablet    Refill:  1    Order Specific Question:   Supervising Provider    Answer:   Joya Gaskins, PATRICK E [[id number]]  . hydrOXYzine (VISTARIL) 25 MG capsule    Sig: Take 1 capsule (25 mg total) by mouth at bedtime as needed.    Dispense:  30 capsule    Refill:  2    Order Specific Question:   Supervising Provider    Answer:   Noralyn Pick  Follow-up: No follow-ups on file.    Loraine Grip Mayers, PA-C

## 2020-06-04 NOTE — Patient Instructions (Addendum)
I encourage you to work on reducing your stress and ways other than sugary treats.  I encourage you to work on improving your sleep patterns as well.  I see that you scheduled your labs for October 11, we will call you with your lab results.  Please let me know if there is anything else we can do for you  Kristine Rad, PA-C Physician Assistant [place] [email or web address]   Managing Stress, Adult Feeling a certain amount of stress is normal. Stress helps our body and mind get ready to deal with the demands of life. Stress hormones can motivate you to do well at work and meet your responsibilities. However severe or long-lasting (chronic) stress can affect your mental and physical health. Chronic stress puts you at higher risk for anxiety, depression, and other health problems like digestive problems, muscle aches, heart disease, high blood pressure, and stroke. What are the causes? Common causes of stress include:  Demands from work, such as deadlines, feeling overworked, or having long hours.  Pressures at home, such as money issues, disagreements with a spouse, or parenting issues.  Pressures from major life changes, such as divorce, moving, loss of a loved one, or chronic illness. You may be at higher risk for stress-related problems if you do not get enough sleep, are in poor health, do not have emotional support, or have a mental health disorder like anxiety or depression. How to recognize stress Stress can make you:  Have trouble sleeping.  Feel sad, anxious, irritable, or overwhelmed.  Lose your appetite.  Overeat or want to eat unhealthy foods.  Want to use drugs or alcohol. Stress can also cause physical symptoms, such as:  Sore, tense muscles, especially in the shoulders and neck.  Headaches.  Trouble breathing.  A faster heart rate.  Stomach pain, nausea, or vomiting.  Diarrhea or  constipation.  Trouble concentrating. Follow these instructions at home: Lifestyle  Identify the source of your stress and your reaction to it. See a therapist who can help you change your reactions.  When there are stressful events: ? Talk about it with family, friends, or co-workers. ? Try to think realistically about stressful events and not ignore them or overreact. ? Try to find the positives in a stressful situation and not focus on the negatives. ? Cut back on responsibilities at work and home, if possible. Ask for help from friends or family members if you need it.  Find ways to cope with stress, such as: ? Meditation. ? Deep breathing. ? Yoga or tai chi. ? Progressive muscle relaxation. ? Doing art, playing music, or reading. ? Making time for fun activities. ? Spending time with family and friends.  Get support from family, friends, or spiritual resources. Eating and drinking  Eat a healthy diet. This includes: ? Eating foods that are high in fiber, such as beans, whole grains, and fresh fruits and vegetables. ? Limiting foods that are high in fat and processed sugars, such as fried and sweet foods.  Do not skip meals or overeat.  Drink enough fluid to keep your urine pale yellow. Alcohol use  Do not drink alcohol if: ? Your health care provider tells you not to drink. ? You are pregnant, may be pregnant, or are planning to become pregnant.  Drinking alcohol is a way some people try to ease their stress. This can be dangerous, so if you drink alcohol: ? Limit how much you use to:  0-1 drink a day  for women.  0-2 drinks a day for men. ? Be aware of how much alcohol is in your drink. In the [place] one drink equals one 12 oz bottle of beer (355 mL), one 5 oz glass of wine (148 mL), or one 1 oz glass of hard liquor (44 mL). Activity   Include 30 minutes of exercise in your daily schedule. Exercise is a good stress reducer.  Include time in your day for an  activity that you find relaxing. Try taking a walk, going on a bike ride, reading a book, or listening to music.  Schedule your time in a way that lowers stress, and keep a consistent schedule. Prioritize what is most important to get done. General instructions  Get enough sleep. Try to go to sleep and get up at about the same time every day.  Take over-the-counter and prescription medicines only as told by your health care provider.  Do not use any products that contain nicotine or tobacco, such as cigarettes, e-cigarettes, and chewing tobacco. If you need help quitting, ask your health care provider.  Do not use drugs or smoke to cope with stress.  Keep all follow-up visits as told by your health care provider. This is important. Where to find support  Talk with your health care provider about stress management or finding a support group.  Find a therapist to work with you on your stress management techniques. Contact a health care provider if:  Your stress symptoms get worse.  You are unable to manage your stress at home.  You are struggling to stop using drugs or alcohol. Get help right away if:  You may be a danger to yourself or others.  You have any thoughts of death or suicide. If you ever feel like you may hurt yourself or others, or have thoughts about taking your own life, get help right away. You can go to your nearest emergency department or call:  Your local emergency services (911 in the [place]  A suicide crisis helpline, such as the [place] at 670-699-6048. This is open 24 hours a day. Summary  Feeling a certain amount of stress is normal, but severe or long-lasting (chronic) stress can affect your mental and physical health.  Chronic stress can put you at higher risk for anxiety, depression, and other health problems like digestive problems, muscle aches, heart disease, high blood pressure, and stroke.  You may be at higher  risk for stress-related problems if you do not get enough sleep, are in poor health, lack emotional support, or have a mental health disorder like anxiety or depression.  Identify the source of your stress and your reaction to it. Try talking about stressful events with family, friends, or co-workers, finding a coping method, or getting support from spiritual resources.  If you need more help, talk with your health care provider about finding a support group or a mental health therapist. This information is not intended to replace advice given to you by your health care provider. Make sure you discuss any questions you have with your health care provider. Document Revised: 03/22/2019 Document Reviewed: 03/22/2019 Elsevier Patient Education  [place].

## 2020-06-10 ENCOUNTER — Other Ambulatory Visit: Payer: 59

## 2020-06-10 ENCOUNTER — Encounter: Payer: Self-pay | Admitting: Cardiovascular Disease

## 2020-06-10 ENCOUNTER — Other Ambulatory Visit: Payer: Self-pay

## 2020-06-10 ENCOUNTER — Ambulatory Visit (INDEPENDENT_AMBULATORY_CARE_PROVIDER_SITE_OTHER): Payer: 59 | Admitting: Cardiovascular Disease

## 2020-06-10 VITALS — BP 122/72 | HR 100 | Ht 63.0 in | Wt 231.0 lb

## 2020-06-10 DIAGNOSIS — I5032 Chronic diastolic (congestive) heart failure: Secondary | ICD-10-CM | POA: Diagnosis not present

## 2020-06-10 DIAGNOSIS — I10 Essential (primary) hypertension: Secondary | ICD-10-CM

## 2020-06-10 NOTE — Progress Notes (Signed)
Cardiology Office Note:    Date:  06/10/2020   ID:  Kristine Bauer, DOB 10-02-1965, MRN [id number]  PCP:  Nicolette Bang, DO  Cardiologist:  Berdella Bacot  Electrophysiologist:  None   Referring MD: Caryl Never*   Chief Complaint  Patient presents with  . Hypertension  . Leg Swelling    Previous notes:   Kristine Bauer is a 54 y.o. female with a hx of hypertension, diabetes mellitus.  We are asked to see her today by Dr. Juleen China for further evaluation of new onset of chest pain.  Has had some swelling in her legs for the past several months  Both legs, left > right .  She is not been making any effort to avoid salt and salty foods.  Eats bacon, country ham Not particularly short of breath. She is also on Norvasc.  Also has developed some cp. Mid sternal ,  Radiates down left arm . Not associated with exercise , not assoc with eating or drinkin g  She has had an echocardiogram which revealed normal left ventricular systolic function.  She has grade 1 diastolic dysfunction.  Right ventricular function is normal. Trivial mitral regurgitation  Lower extremity venous duplex scans reveal no evidence of DVT in either leg.  BP has been well controlled.  Is customer service at [employer]    Oct. 4, 2021:  Kristine Bauer is feeling well.  Walks to the bus stop to work and then home.  Does not walk or exercise .  Swelling has improved since her last visit  But is still 2-3 +   Past Medical History:  Diagnosis Date  . Allergic rhinitis   . Anemia   . Asthma   . Chest pain    LEFT SIDED-SEVERAL YEARS  . Chronic diastolic heart failure ([place]) 01/01/2020  . Diabetes mellitus, type 2 ([place])   . ETD (Eustachian tube dysfunction), bilateral 04/20/2017  . GERD (gastroesophageal reflux disease)   . HTN (hypertension)   . Hyperlipidemia   . Nasal turbinate hypertrophy 04/20/2017  . Vitamin D deficiency     Past Surgical History:  Procedure Laterality Date  .  ENDOMETRIAL BIOPSY      Current Medications: Current Meds  Medication Sig  . albuterol (VENTOLIN HFA) 108 (90 Base) MCG/ACT inhaler Inhale 1-2 puffs into the lungs every 6 (six) hours as needed for wheezing or shortness of breath.  Marland Kitchen amLODipine (NORVASC) 5 MG tablet Take 1 tablet (5 mg total) by mouth daily.  Marland Kitchen azelastine (OPTIVAR) 0.05 % ophthalmic solution   . Cholecalciferol (VITAMIN D) 50 MCG (2000 UT) tablet Take 1 tablet (2,000 Units total) by mouth daily.  . diclofenac (VOLTAREN) 50 MG EC tablet Take 1 tablet (50 mg total) by mouth 2 (two) times daily.  . fluticasone (FLONASE) 50 MCG/ACT nasal spray Place 2 sprays into both nostrils daily.  Marland Kitchen gabapentin (NEURONTIN) 300 MG capsule Take 1 capsule (300 mg total) by mouth 3 (three) times daily.  Marland Kitchen glucose blood (PRODIGY NO CODING BLOOD GLUC) test strip Use as instructed to check blood sugar twice daily. E11.8  . hydrochlorothiazide (HYDRODIURIL) 25 MG tablet Take 1 tablet (25 mg total) by mouth daily.  . hydrOXYzine (VISTARIL) 25 MG capsule Take 1 capsule (25 mg total) by mouth at bedtime as needed.  . insulin glargine (LANTUS SOLOSTAR) 100 UNIT/ML Solostar Pen Inject 10 Units into the skin daily.  . Insulin Pen Needle (TRUEPLUS PEN NEEDLES) 32G X 4 MM MISC Use as instructed to inject  insulin daily.  . Lancets (ACCU-CHEK MULTICLIX) lancets 1 each by Other route 3 (three) times daily.  . metFORMIN (GLUCOPHAGE-XR) 500 MG 24 hr tablet Take 3 tablets (1,500 mg total) by mouth daily with breakfast.  . montelukast (SINGULAIR) 10 MG tablet Take 1 tablet (10 mg total) by mouth at bedtime.  . Multiple Vitamins-Iron (DAILY MULTIVITAMINS/IRON PO) Take 1 tablet by mouth daily.   . pantoprazole (PROTONIX) 40 MG tablet Take 1 tablet (40 mg total) by mouth daily.  . sitaGLIPtin (JANUVIA) 100 MG tablet Take 100 mg by mouth daily.    Marland Kitchen tiZANidine (ZANAFLEX) 4 MG tablet Take 1 tablet (4 mg total) by mouth at bedtime.  . valsartan (DIOVAN) 160 MG tablet  TAKE 1 TABLET BY MOUTH EVERY DAY  . [DISCONTINUED] aspirin EC 81 MG tablet Take 81 mg by mouth daily.  . [DISCONTINUED] BEPREVE 1.5 % SOLN Place 1 drop into both eyes 2 (two) times daily.  . [DISCONTINUED] glimepiride (AMARYL) 4 MG tablet Take 1 tablet (4 mg total) by mouth 2 (two) times daily.     Allergies:   Statins   Social History   Socioeconomic History  . Marital status: Single    Spouse name: Not on file  . Number of children: 0  . Years of education: 16  . Highest education level: Bachelor's degree (e.g., BA, AB, BS)  Occupational History  . Occupation: Sales executive: [employer]  Tobacco Use  . Smoking status: Never Smoker  . Smokeless tobacco: Never Used  Vaping Use  . Vaping Use: Never used  Substance and Sexual Activity  . Alcohol use: No  . Drug use: Never  . Sexual activity: Not Currently  Other Topics Concern  . Not on file  Social History Narrative   Lives in an apartment on the 2nd floor.  No children.  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  Education: college.    Social Determinants of Health   Financial Resource Strain:   . Difficulty of Paying Living Expenses: Not on file  Food Insecurity:   . Worried About Charity fundraiser in the Last Year: Not on file  . Ran Out of Food in the Last Year: Not on file  Transportation Needs:   . Lack of Transportation (Medical): Not on file  . Lack of Transportation (Non-Medical): Not on file  Physical Activity:   . Days of Exercise per Week: Not on file  . Minutes of Exercise per Session: Not on file  Stress:   . Feeling of Stress : Not on file  Social Connections:   . Frequency of Communication with Friends and Family: Not on file  . Frequency of Social Gatherings with Friends and Family: Not on file  . Attends Religious Services: Not on file  . Active Member of Clubs or Organizations: Not on file  . Attends Archivist Meetings: Not on file  . Marital Status: Not on file     Family  History: The patient's family history includes Diabetes type II in her mother; Glaucoma in her father; Hypertension in her father and mother; Multiple sclerosis in her sister. There is no history of Breast cancer.  ROS:   Please see the history of present illness.     All other systems reviewed and are negative.  EKGs/Labs/Other Studies Reviewed:    The following studies were reviewed today:   EKG:      Recent Labs: 02/09/2020: ALT 228; BNP 5.7; BUN 23; Creatinine, Ser 0.78;  Hemoglobin 11.3; Platelets 348; Potassium 4.1; Sodium 137; TSH 1.560  Recent Lipid Panel    Component Value Date/Time   CHOL 188 10/03/2019 1642   TRIG 66 10/03/2019 1642   HDL 71 10/03/2019 1642   CHOLHDL 2.6 10/03/2019 1642   CHOLHDL 2.5 Ratio 11/18/2009 2045   VLDL 10 11/18/2009 2045   LDLCALC 105 (H) 10/03/2019 1642    Physical Exam:    Physical Exam: Blood pressure 122/72, pulse 100, height 5\' 3"  (1.6 m), weight 231 lb (104.8 kg), SpO2 100 %.  GEN:  Well nourished, well developed in no acute distress HEENT: Normal NECK: No JVD; No carotid bruits LYMPHATICS: No lymphadenopathy CARDIAC: RRR , no murmurs, rubs, gallops RESPIRATORY:  Clear to auscultation without rales, wheezing or rhonchi  ABDOMEN: Soft, non-tender, non-distended MUSCULOSKELETAL:  No edema; No deformity  SKIN: Warm and dry NEUROLOGIC:  Alert and oriented x 3   ASSESSMENT:    No diagnosis found. PLAN:    In order of problems listed above:  1. Chronic diastolic congestive heart failure: Kristine Bauer presents with symptoms of volume overload and leg edema.  Her recent echocardiogram reveals grade 1 diastolic dysfunction.  She clearly eats a high salt diet.  She has bilateral leg edema.   More discussion about her salt intake  She needs to greatly reduce her salty foods.  Advised some toe lifts to help with leg edema Compression hose, Leg elevation   Will see her in 6 months   2.  HTN:   BP is better .   Cont diet, weight  loss.   3. Leg edema:   Compression hose, leg elevation weight loss. , salt restriction     Medication Adjustments/Labs and Tests Ordered: Current medicines are reviewed at length with the patient today.  Concerns regarding medicines are outlined above.  No orders of the defined types were placed in this encounter.  No orders of the defined types were placed in this encounter.   Patient Instructions  Medication Instructions:  Your physician has recommended you make the following change in your medication:  STOP Aspirin *If you need a refill on your cardiac medications before your next appointment, please call your pharmacy*   Lab Work: None Ordered If you have labs (blood work) drawn today and your tests are completely normal, you will receive your results only by: Marland Kitchen MyChart Message (if you have MyChart) OR . A paper copy in the mail If you have any lab test that is abnormal or we need to change your treatment, we will call you to review the results.    Testing/Procedures: None Ordered    Follow-Up: At [facility], you and your health needs are our priority.  As part of our continuing mission to provide you with exceptional heart care, we have created designated Provider Care Teams.  These Care Teams include your primary Cardiologist (physician) and Advanced Practice Providers (APPs -  Physician Assistants and Nurse Practitioners) who all work together to provide you with the care you need, when you need it.   Your next appointment:   6 month(s) on March 21 with Dr. Acie Fredrickson  The format for your next appointment:   In Person  Provider:   You may see Mertie Moores, MD or one of the following Advanced Practice Providers on your designated Care Team:    Richardson Dopp, PA-C  Robbie Lis, [place]        Signed, Mertie Moores, MD  06/10/2020 5:18 PM    [facility]

## 2020-06-10 NOTE — Patient Instructions (Addendum)
Medication Instructions:  Your physician has recommended you make the following change in your medication:  STOP Aspirin *If you need a refill on your cardiac medications before your next appointment, please call your pharmacy*   Lab Work: None Ordered If you have labs (blood work) drawn today and your tests are completely normal, you will receive your results only by: Marland Kitchen MyChart Message (if you have MyChart) OR . A paper copy in the mail If you have any lab test that is abnormal or we need to change your treatment, we will call you to review the results.    Testing/Procedures: None Ordered    Follow-Up: At [facility], you and your health needs are our priority.  As part of our continuing mission to provide you with exceptional heart care, we have created designated Provider Care Teams.  These Care Teams include your primary Cardiologist (physician) and Advanced Practice Providers (APPs -  Physician Assistants and Nurse Practitioners) who all work together to provide you with the care you need, when you need it.   Your next appointment:   6 month(s) on March 21 with Dr. Acie Fredrickson  The format for your next appointment:   In Person  Provider:   You may see Mertie Moores, MD or one of the following Advanced Practice Providers on your designated Care Team:    Richardson Dopp, PA-C  [place], [place]

## 2020-06-12 ENCOUNTER — Telehealth: Payer: Self-pay | Admitting: Cardiovascular Disease

## 2020-06-12 NOTE — Telephone Encounter (Signed)
Called and spoke to patient. She states that she was reviewing her progress notes from her visit on MyChart and read "Also has developed some cp" and wanted to know what cp stood for. Made patient aware that it stands for chest pain. Patient denies having any other questions or concerns at this time.

## 2020-06-12 NOTE — Telephone Encounter (Signed)
   Pt would like to speak with Dr. Elmarie Shiley nurse. She is reading her AVS and she wanted to know what "Also has developed some cp" mean. She said if someone can call her before 21 nn today or leave a detailed message to her vm

## 2020-06-17 ENCOUNTER — Other Ambulatory Visit: Payer: Self-pay

## 2020-06-17 ENCOUNTER — Other Ambulatory Visit (INDEPENDENT_AMBULATORY_CARE_PROVIDER_SITE_OTHER): Payer: 59

## 2020-06-17 DIAGNOSIS — Z1159 Encounter for screening for other viral diseases: Secondary | ICD-10-CM

## 2020-06-17 DIAGNOSIS — E119 Type 2 diabetes mellitus without complications: Secondary | ICD-10-CM | POA: Diagnosis not present

## 2020-06-18 LAB — HCV INTERPRETATION

## 2020-06-18 LAB — HCV AB W/RFLX TO VERIFICATION: HCV Ab: 0.1 s/co ratio (ref 0.0–0.9)

## 2020-06-18 LAB — HEMOGLOBIN A1C
Est. average glucose Bld gHb Est-mCnc: 283 mg/dL
Hgb A1c MFr Bld: 11.5 % — ABNORMAL HIGH (ref 4.8–5.6)

## 2020-06-21 ENCOUNTER — Other Ambulatory Visit: Payer: Self-pay

## 2020-06-21 MED ORDER — FLUTICASONE PROPIONATE 50 MCG/ACT NA SUSP: 2.0000 | Freq: Every day | NASAL | 6 refills | Status: AC

## 2020-06-25 ENCOUNTER — Telehealth: Payer: Self-pay | Admitting: *Deleted

## 2020-06-25 ENCOUNTER — Encounter: Payer: Self-pay | Admitting: Emergency Medicine

## 2020-06-25 ENCOUNTER — Ambulatory Visit
Admission: EM | Admit: 2020-06-25 | Discharge: 2020-06-25 | Disposition: A | Discharge status: Home / Self Care | Payer: 59 | Attending: Emergency Medicine | Admitting: Emergency Medicine

## 2020-06-25 ENCOUNTER — Other Ambulatory Visit: Payer: Self-pay

## 2020-06-25 DIAGNOSIS — M545 Low back pain, unspecified: Secondary | ICD-10-CM | POA: Diagnosis not present

## 2020-06-25 LAB — POCT URINALYSIS DIP (MANUAL ENTRY)
Bilirubin, UA: NEGATIVE
Blood, UA: NEGATIVE
Glucose, UA: NEGATIVE mg/dL
Ketones, POC UA: NEGATIVE mg/dL
Leukocytes, UA: NEGATIVE
Nitrite, UA: NEGATIVE
Protein Ur, POC: NEGATIVE mg/dL
Spec Grav, UA: 1.025 (ref 1.010–1.025)
Urobilinogen, UA: 0.2 E.U./dL
pH, UA: 6 (ref 5.0–8.0)

## 2020-06-25 LAB — POCT FASTING CBG KUC MANUAL ENTRY: POCT Glucose (KUC): 175 mg/dL — AB (ref 70–99)

## 2020-06-25 NOTE — Discharge Instructions (Addendum)
RICE: rest, ice, compression, elevation as needed for pain.    Heat therapy (hot compress, warm wash rag, hot showers, etc.) can help relax muscles and soothe muscle aches. Cold therapy (ice packs) can be used to help swelling both after injury and after prolonged use of areas of chronic pain/aches.  Pain medication:  350 mg-1000 mg of Tylenol (acetaminophen) and/or 200 mg - 800 mg of Advil (ibuprofen, Motrin) every 8 hours as needed.  May alternate between the two throughout the day as they are generally safe to take together.  DO NOT exceed more than 3000 mg of Tylenol or 3200 mg of ibuprofen in a 24 hour period as this could damage your stomach, kidneys, liver, or increase your bleeding risk.   Important to follow up with specialist(s) below for further evaluation/management if your symptoms persist or worsen.

## 2020-06-25 NOTE — Telephone Encounter (Signed)
Medical Assistant left message on patient's home and cell voicemail. Voicemail states to give a call back to [place] with [facility] at [phone]. Patient is aware via VM that HCV was negativec A1C is improving and patient encouraged to improve diet and follow up with 3 month appointment with Dr. Juleen China.

## 2020-06-25 NOTE — ED Provider Notes (Signed)
[facility]    CSN: [id number] Arrival date & time: 06/25/20  1831      History   Chief Complaint Chief Complaint  Patient presents with  . Blurred Vision  . Back Pain    HPI Kristine Bauer is a 54 y.o. female  Presenting for multiple concerns. Noting bilateral low back pain since Thursday.  Denies trauma or fall.  No radiation or rash, fever.  States she began wearing a fanny pack that same day that is heavy with lots of coins as she bought new pants without pockets.  Concern for UTI despite lack of symptoms due to low back pain as she has had this before.  Last BM this morning: Normal for her.  No hematochezia or melena. Patient also requesting information for ophthalmology: Endorsing increased blurred vision for the last 2 weeks.  Denies gross visual changes.  States intermittent without exacerbating relieving factors.  Has been eating more sugar than normal but has been better this week with some improvement.  States it has been a while since she seen an eye doctor.  Followed routinely by her PCP for diabetic management Requesting work note for tomorrow off due to low back pain.  Past Medical History:  Diagnosis Date  . Allergic rhinitis   . Anemia   . Asthma   . Chest pain    LEFT SIDED-SEVERAL YEARS  . Chronic diastolic heart failure ([place]) 01/01/2020  . Diabetes mellitus, type 2 ([place])   . ETD (Eustachian tube dysfunction), bilateral 04/20/2017  . GERD (gastroesophageal reflux disease)   . HTN (hypertension)   . Hyperlipidemia   . Nasal turbinate hypertrophy 04/20/2017  . Vitamin D deficiency     Patient Active Problem List   Diagnosis Date Noted  . Chronic diastolic heart failure ([place]) 01/01/2020  . Leg edema 11/08/2019  . Proximal leg weakness 08/12/2018  . ETD (Eustachian tube dysfunction), bilateral 04/20/2017  . Nasal turbinate hypertrophy 04/20/2017  . Seasonal allergic rhinitis 04/20/2017  . Diabetes mellitus, type 2 ([place])   . Chest pain  of uncertain etiology   . HTN (hypertension)   . Asthma   . GERD (gastroesophageal reflux disease)   . Anemia     Past Surgical History:  Procedure Laterality Date  . ENDOMETRIAL BIOPSY      OB History   No obstetric history on file.      Home Medications    Prior to Admission medications   Medication Sig Start Date End Date Taking? Authorizing Provider  albuterol (VENTOLIN HFA) 108 (90 Base) MCG/ACT inhaler Inhale 1-2 puffs into the lungs every 6 (six) hours as needed for wheezing or shortness of breath. 01/04/20   Nicolette Bang, DO  amLODipine (NORVASC) 5 MG tablet Take 1 tablet (5 mg total) by mouth daily. 04/19/20   Nahser, Wonda Cheng, MD  azelastine (OPTIVAR) 0.05 % ophthalmic solution  12/22/19   [provider]  Cholecalciferol (VITAMIN D) 50 MCG (2000 UT) tablet Take 1 tablet (2,000 Units total) by mouth daily. 01/11/20   Charlott Rakes, MD  diclofenac (VOLTAREN) 50 MG EC tablet Take 1 tablet (50 mg total) by mouth 2 (two) times daily. 06/03/20   Mayers, Cari S, PA-C  fluticasone (FLONASE) 50 MCG/ACT nasal spray Place 2 sprays into both nostrils daily. 06/21/20   Nicolette Bang, DO  gabapentin (NEURONTIN) 300 MG capsule Take 1 capsule (300 mg total) by mouth 3 (three) times daily. 09/14/19   Hyatt, Max T, DPM  glucose blood (  PRODIGY NO CODING BLOOD GLUC) test strip Use as instructed to check blood sugar twice daily. E11.8 01/25/20   Charlott Rakes, MD  hydrochlorothiazide (HYDRODIURIL) 25 MG tablet Take 1 tablet (25 mg total) by mouth daily. 06/03/20   Mayers, Cari S, PA-C  hydrOXYzine (VISTARIL) 25 MG capsule Take 1 capsule (25 mg total) by mouth at bedtime as needed. 06/03/20   Mayers, Cari S, PA-C  insulin glargine (LANTUS SOLOSTAR) 100 UNIT/ML Solostar Pen Inject 10 Units into the skin daily. 01/08/20   Charlott Rakes, MD  Insulin Pen Needle (TRUEPLUS PEN NEEDLES) 32G X 4 MM MISC Use as instructed to inject insulin daily. 01/15/20   Charlott Rakes, MD    Lancets (ACCU-CHEK MULTICLIX) lancets 1 each by Other route 3 (three) times daily. 03/19/17   [provider]  metFORMIN (GLUCOPHAGE-XR) 500 MG 24 hr tablet Take 3 tablets (1,500 mg total) by mouth daily with breakfast. 04/19/20   Nicolette Bang, DO  montelukast (SINGULAIR) 10 MG tablet Take 1 tablet (10 mg total) by mouth at bedtime. 04/19/20   Nicolette Bang, DO  Multiple Vitamins-Iron (DAILY MULTIVITAMINS/IRON PO) Take 1 tablet by mouth daily.     [provider]  pantoprazole (PROTONIX) 40 MG tablet Take 1 tablet (40 mg total) by mouth daily. 04/19/20   Nicolette Bang, DO  sitaGLIPtin (JANUVIA) 100 MG tablet Take 100 mg by mouth daily.      [provider]  tiZANidine (ZANAFLEX) 4 MG tablet Take 1 tablet (4 mg total) by mouth at bedtime. 06/03/20   Mayers, Cari S, PA-C  valsartan (DIOVAN) 160 MG tablet TAKE 1 TABLET BY MOUTH EVERY DAY 04/18/20   Nahser, Wonda Cheng, MD    Family History Family History  Problem Relation Age of Onset  . Hypertension Father   . Glaucoma Father   . Hypertension Mother   . Diabetes type II Mother   . Multiple sclerosis Sister   . Breast cancer Neg Hx     Social History Social History   Tobacco Use  . Smoking status: Never Smoker  . Smokeless tobacco: Never Used  Vaping Use  . Vaping Use: Never used  Substance Use Topics  . Alcohol use: No  . Drug use: Never     Allergies   Statins   Review of Systems As per HPI   Physical Exam Triage Vital Signs ED Triage Vitals  Enc Vitals Group     BP 06/25/20 1942 (!) 167/70     Pulse Rate 06/25/20 1942 99     Resp 06/25/20 1942 18     Temp 06/25/20 1942 97.6 F (36.4 C)     Temp Source 06/25/20 1942 Oral     SpO2 06/25/20 1942 99 %     Weight --      Height --      Head Circumference --      Peak Flow --      Pain Score 06/25/20 1943 6     Pain Loc --      Pain Edu? --      Excl. in [place]? --    No data found.  Updated Vital  Signs BP (!) 167/70 (BP Location: Left Arm)   Pulse 99   Temp 97.6 F (36.4 C) (Oral)   Resp 18   SpO2 99%   Visual Acuity Right Eye Distance:   Left Eye Distance:   Bilateral Distance:    Right Eye Near:   Left Eye Near:  Bilateral Near:     Physical Exam Constitutional:      General: She is not in acute distress. HENT:     Head: Normocephalic and atraumatic.  Eyes:     General: No scleral icterus.    Pupils: Pupils are equal, round, and reactive to light.  Cardiovascular:     Rate and Rhythm: Normal rate.  Pulmonary:     Effort: Pulmonary effort is normal.  Musculoskeletal:        General: No swelling, tenderness, deformity or signs of injury. Normal range of motion.     Right lower leg: No edema.     Left lower leg: No edema.  Skin:    General: Skin is warm.     Capillary Refill: Capillary refill takes less than 2 seconds.     Coloration: Skin is not jaundiced or pale.     Findings: No rash.  Neurological:     General: No focal deficit present.     Mental Status: She is alert and oriented to person, place, and time.      UC Treatments / Results  Labs (all labs ordered are listed, but only abnormal results are displayed) Labs Reviewed  POCT FASTING CBG [facility] MANUAL ENTRY - Abnormal; Notable for the following components:      Result Value   POCT Glucose ([facility]) 175 (*)    All other components within normal limits  POCT URINALYSIS DIP (MANUAL ENTRY)    EKG   Radiology No results found.  Procedures Procedures (including critical care time)  Medications Ordered in UC Medications - No data to display  Initial Impression / Assessment and Plan / UC Course  I have reviewed the triage vital signs and the nursing notes.  Pertinent labs & imaging results that were available during my care of the patient were reviewed by me and considered in my medical decision making (see chart for details).     Afebrile, nontoxic in office today.  Urine dip  unremarkable, CBG 175.  Denies nausea, vomiting, abdominal pain currently.  No appreciable lumbar deformity or tenderness.  Likely due to wearing fanny pack.  Will stop doing this, treat supportively as below.  Work note provided.  Return precautions discussed, pt verbalized understanding and is agreeable to plan. Final Clinical Impressions(s) / UC Diagnoses   Final diagnoses:  Acute bilateral low back pain without sciatica   Discharge Instructions   None    ED Prescriptions    None     PDMP not reviewed this encounter.   Hall-Potvin, [place], [place] 06/25/20 2054

## 2020-06-25 NOTE — ED Triage Notes (Signed)
Pt here for lower back pain x 2 weeks; pt sts blurred vision  2 weeks; pt sts not eating as she is supposed to and her sugar was elevated but has been better this week; pt thinks she could have UTI

## 2020-06-25 NOTE — Telephone Encounter (Signed)
-----   Message from Kennieth Rad, [place] sent at 06/18/2020  9:18 AM EDT ----- Please call patient and let her screen for HCV was negative.  Her A1C has improved some, she needs to continue improving her diet. She needs a f/u appt with Dr. Juleen China for 3 months.

## 2020-06-26 ENCOUNTER — Telehealth: Payer: Self-pay | Admitting: Emergency Medicine

## 2020-06-26 LAB — HM DIABETES EYE EXAM

## 2020-06-26 MED ORDER — CYCLOBENZAPRINE HCL 10 MG PO TABS: 5.0000 mg | ORAL_TABLET | Freq: Three times a day (TID) | ORAL | 0 refills | Status: DC | PRN

## 2020-06-26 NOTE — Telephone Encounter (Signed)
Forgot note before closing previous telephone encounter.  Patient called asking for prescription for cyclobenzaprine due to pain not resolving.  Spoke to [place], [place] who gave verbal and prescription sent.  Patient updated.

## 2020-06-30 ENCOUNTER — Other Ambulatory Visit: Payer: Self-pay | Admitting: Family Medicine

## 2020-06-30 NOTE — Telephone Encounter (Signed)
Routing to [facility] at [facility]

## 2020-07-19 ENCOUNTER — Other Ambulatory Visit: Payer: Self-pay | Admitting: Podiatry

## 2020-07-23 ENCOUNTER — Other Ambulatory Visit: Payer: Self-pay

## 2020-07-23 DIAGNOSIS — E119 Type 2 diabetes mellitus without complications: Secondary | ICD-10-CM

## 2020-07-23 MED ORDER — PANTOPRAZOLE SODIUM 40 MG PO TBEC
40.0000 mg | DELAYED_RELEASE_TABLET | Freq: Every day | ORAL | 0 refills | Status: DC
Start: 2020-07-23 — End: 2020-08-05

## 2020-07-23 MED ORDER — GLIMEPIRIDE 4 MG PO TABS: 4.0000 mg | ORAL_TABLET | Freq: Two times a day (BID) | ORAL | 0 refills | Status: DC

## 2020-07-23 MED ORDER — METFORMIN HCL ER 500 MG PO TB24: 1500.0000 mg | ORAL_TABLET | Freq: Every day | ORAL | 0 refills | Status: AC

## 2020-07-23 MED ORDER — MONTELUKAST SODIUM 10 MG PO TABS: 10.0000 mg | ORAL_TABLET | Freq: Every day | ORAL | 0 refills | Status: DC

## 2020-08-05 ENCOUNTER — Other Ambulatory Visit: Payer: Self-pay | Admitting: Internal Medicine

## 2020-08-05 DIAGNOSIS — E119 Type 2 diabetes mellitus without complications: Secondary | ICD-10-CM

## 2020-08-17 ENCOUNTER — Encounter (HOSPITAL_COMMUNITY): Payer: Self-pay | Admitting: Emergency Medicine

## 2020-08-17 ENCOUNTER — Other Ambulatory Visit: Payer: Self-pay

## 2020-08-17 ENCOUNTER — Ambulatory Visit (HOSPITAL_COMMUNITY)
Admission: EM | Admit: 2020-08-17 | Discharge: 2020-08-17 | Disposition: A | Discharge status: Home / Self Care | Payer: 59 | Attending: Physician Assistant | Admitting: Physician Assistant

## 2020-08-17 DIAGNOSIS — S39012A Strain of muscle, fascia and tendon of lower back, initial encounter: Secondary | ICD-10-CM

## 2020-08-17 MED ORDER — CYCLOBENZAPRINE HCL 10 MG PO TABS: 5.0000 mg | ORAL_TABLET | Freq: Three times a day (TID) | ORAL | 0 refills | Status: DC | PRN

## 2020-08-17 NOTE — ED Triage Notes (Signed)
Pt states that her back spasm have gotten worse  and she does have neuropathy in her feet. The pain can keep her during the night and takes gabapentin for pain.

## 2020-08-17 NOTE — ED Provider Notes (Signed)
[place]    CSN: [id number] Arrival date & time: 08/17/20  1727      History   Chief Complaint Chief Complaint  Patient presents with   Back Pain    HPI Rosalyn Archambault is a 54 y.o. female.   Patient with PMH DM and neuropathy,  c/w back pain, this is a chronic problem x 2 months, which waxes and wanes.  Recent flare up 2 days ago, b/l lower lumbar pain w/o radiation. No known injury or trauma as causative agent to recent flare up. Numbness of feet at baseline.  She requesting muscle relaxer, as she states that helps the pain.  She notes neuropathy in feet worsening, she has doubled rx dose of gabapentin and asking if she can triple dose.  I advised her she needs to follow up with PCP.     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[facility])    Nasal turbinate hypertrophy 04/20/2017   Vitamin D deficiency     Patient Active Problem List   Diagnosis Date Noted   Chronic diastolic heart failure ([place]) 01/01/2020   Leg edema 11/08/2019   Proximal leg weakness 08/12/2018   ETD (Eustachian tube dysfunction), bilateral 04/20/2017   Nasal turbinate hypertrophy 04/20/2017   Seasonal allergic rhinitis 04/20/2017   Diabetes mellitus, type 2 ([facility])    Chest pain of uncertain etiology    HTN (hypertension)    Asthma    GERD (gastroesophageal reflux disease)    Anemia     Past Surgical History:  Procedure Laterality Date   ENDOMETRIAL BIOPSY      OB History   No obstetric history on file.      Home Medications    Prior to Admission medications   Medication Sig Start  Date End Date Taking? Authorizing Provider  amLODipine (NORVASC) 5 MG tablet Take 1 tablet (5 mg total) by mouth daily. 04/19/20  Yes Nahser, Wonda Cheng, MD  gabapentin (NEURONTIN) 300 MG capsule TAKE 1 CAPSULE BY MOUTH THREE TIMES A DAY 07/19/20  Yes Hyatt, Max T, DPM  glimepiride (AMARYL) 4 MG tablet TAKE 1 TABLET BY MOUTH 2 TIMES DAILY. 08/05/20  Yes Nicolette Bang, DO  hydrochlorothiazide (HYDRODIURIL) 25 MG tablet Take 1 tablet (25 mg total) by mouth daily. 06/03/20  Yes Mayers, Cari S, PA-C  hydrOXYzine (VISTARIL) 25 MG capsule Take 1 capsule (25 mg total) by mouth at bedtime as needed. 06/03/20  Yes Mayers, Cari S, PA-C  insulin glargine (LANTUS SOLOSTAR) 100 UNIT/ML Solostar Pen Inject 10 Units into the skin daily. 01/08/20  Yes Charlott Rakes, MD  metFORMIN (GLUCOPHAGE-XR) 500 MG 24 hr tablet Take 3 tablets (1,500 mg total) by mouth daily with breakfast. 07/23/20  Yes Nicolette Bang, DO  montelukast (SINGULAIR) 10 MG tablet Take 1 tablet (10 mg total) by mouth at bedtime. 07/23/20  Yes Nicolette Bang, DO  pantoprazole (PROTONIX) 40 MG tablet TAKE 1 TABLET BY MOUTH EVERY DAY 08/05/20  Yes Nicolette Bang, DO  sitaGLIPtin (JANUVIA) 100 MG tablet Take 100 mg by mouth daily.   Yes  [provider]  valsartan (DIOVAN) 160 MG tablet TAKE 1 TABLET BY MOUTH EVERY DAY 04/18/20  Yes Nahser, Wonda Cheng, MD  albuterol (VENTOLIN HFA) 108 (90 Base) MCG/ACT inhaler Inhale 1-2 puffs into the lungs every 6 (six) hours as needed for wheezing or shortness of breath. 01/04/20   Nicolette Bang, DO  azelastine (OPTIVAR) 0.05 % ophthalmic solution  12/22/19   [provider]  cyclobenzaprine (FLEXERIL) 10 MG tablet Take 0.5 tablets (5 mg total) by mouth every 8 (eight) hours as needed for muscle spasms. 08/17/20   Peri Jefferson, PA-C  D3 50 MCG (2000 UT) TABS TAKE 1 TABLET BY MOUTH EVERY DAY 07/02/20   Fulp, Cammie, MD  diclofenac (VOLTAREN) 50 MG EC  tablet Take 1 tablet (50 mg total) by mouth 2 (two) times daily. 06/03/20   Mayers, Cari S, PA-C  fluticasone (FLONASE) 50 MCG/ACT nasal spray Place 2 sprays into both nostrils daily. 06/21/20   Nicolette Bang, DO  glucose blood (PRODIGY NO CODING BLOOD GLUC) test strip Use as instructed to check blood sugar twice daily. E11.8 01/25/20   Charlott Rakes, MD  Insulin Pen Needle (TRUEPLUS PEN NEEDLES) 32G X 4 MM MISC Use as instructed to inject insulin daily. 01/15/20   Charlott Rakes, MD  Lancets (ACCU-CHEK MULTICLIX) lancets 1 each by Other route 3 (three) times daily. 03/19/17   [provider]  Multiple Vitamins-Iron (DAILY MULTIVITAMINS/IRON PO) Take 1 tablet by mouth daily.     [provider]  tiZANidine (ZANAFLEX) 4 MG tablet Take 1 tablet (4 mg total) by mouth at bedtime. 06/03/20   Mayers, Loraine Grip, PA-C    Family History Family History  Problem Relation Age of Onset   Hypertension Father    Glaucoma Father    Hypertension Mother    Diabetes type II Mother    Multiple sclerosis Sister    Breast cancer Neg Hx     Social History Social History   Tobacco Use   Smoking status: Never Smoker   Smokeless tobacco: Never Used  Scientific laboratory technician Use: Never used  Substance Use Topics   Alcohol use: No   Drug use: Never     Allergies   Statins   Review of Systems Review of Systems  Musculoskeletal: Positive for back pain and myalgias. Negative for arthralgias, joint swelling and neck pain.  Skin: Negative for color change and wound.  Neurological: Negative for weakness and numbness.  Hematological: Negative for adenopathy. Does not bruise/bleed easily.  Psychiatric/Behavioral: Negative for agitation and sleep disturbance.     Physical Exam Triage Vital Signs ED Triage Vitals  Enc Vitals Group     BP 08/17/20 1811 132/62     Pulse Rate 08/17/20 1811 (!) 111     Resp 08/17/20 1811 20     Temp --      Temp src --      SpO2 08/17/20  1811 100 %     Weight --      Height --      Head Circumference --      Peak Flow --      Pain Score 08/17/20 1808 10     Pain Loc --      Pain Edu? --      Excl. in [place]? --    No data found.  Updated Vital Signs BP 132/62 (BP Location: Left Arm)    Pulse (!) 111    Resp 20    SpO2  100%   Visual Acuity Right Eye Distance:   Left Eye Distance:   Bilateral Distance:    Right Eye Near:   Left Eye Near:    Bilateral Near:     Physical Exam Vitals and nursing note reviewed.  Constitutional:      General: She is not in acute distress.    Appearance: Normal appearance. She is obese. She is not ill-appearing.  HENT:     Head: Normocephalic and atraumatic.     Right Ear: Tympanic membrane and ear canal normal.     Left Ear: Tympanic membrane and ear canal normal.     Mouth/Throat:     Pharynx: No oropharyngeal exudate or posterior oropharyngeal erythema.  Eyes:     General: No scleral icterus.    Extraocular Movements: Extraocular movements intact.     Conjunctiva/sclera: Conjunctivae normal.  Pulmonary:     Effort: Pulmonary effort is normal. No respiratory distress.  Musculoskeletal:     Cervical back: Normal range of motion. No rigidity.     Lumbar back: Spasms and tenderness (b/l diffuse tenderness) present. No lacerations or bony tenderness. Normal range of motion. Negative right straight leg raise test and negative left straight leg raise test.  Skin:    Capillary Refill: Capillary refill takes less than 2 seconds.     Coloration: Skin is not jaundiced.     Findings: No rash.  Neurological:     General: No focal deficit present.     Mental Status: She is alert and oriented to person, place, and time.     Motor: No weakness.     Gait: Gait normal.  Psychiatric:        Mood and Affect: Mood normal.        Behavior: Behavior normal.      UC Treatments / Results  Labs (all labs ordered are listed, but only abnormal results are displayed) Labs Reviewed - No data  to display  EKG   Radiology No results found.  Procedures Procedures (including critical care time)  Medications Ordered in UC Medications - No data to display  Initial Impression / Assessment and Plan / UC Course  I have reviewed the triage vital signs and the nursing notes.  Pertinent labs & imaging results that were available during my care of the patient were reviewed by me and considered in my medical decision making (see chart for details).     Patient to follow up with PCP to discuss neuropathy and increasing medication, I advised her to continue dose of gabapentin as prescribed by PCP Follow up with PCP to discuss back pain and possible referral to physical therapy. Final Clinical Impressions(s) / UC Diagnoses   Final diagnoses:  Strain of lumbar region, initial encounter     Discharge Instructions     Follow up with PCP to discuss back pain and neuropathy in feet Apply ice and heat to back 15 minutes 4 times per day Stretch back daily.      ED Prescriptions    Medication Sig Dispense Auth. Provider   cyclobenzaprine (FLEXERIL) 10 MG tablet Take 0.5 tablets (5 mg total) by mouth every 8 (eight) hours as needed for muscle spasms. 15 tablet Peri Jefferson, PA-C     PDMP not reviewed this encounter.   Peri Jefferson, PA-C 08/17/20 1919

## 2020-08-17 NOTE — Discharge Instructions (Addendum)
Follow up with PCP to discuss back pain and neuropathy in feet Apply ice and heat to back 15 minutes 4 times per day Stretch back daily.

## 2020-09-09 ENCOUNTER — Other Ambulatory Visit: Payer: Self-pay | Admitting: Cardiovascular Disease

## 2020-09-09 DIAGNOSIS — I10 Essential (primary) hypertension: Secondary | ICD-10-CM

## 2020-09-09 DIAGNOSIS — R600 Localized edema: Secondary | ICD-10-CM

## 2020-10-25 ENCOUNTER — Other Ambulatory Visit: Payer: Self-pay | Admitting: Internal Medicine

## 2020-11-24 ENCOUNTER — Other Ambulatory Visit: Payer: Self-pay

## 2020-11-24 ENCOUNTER — Inpatient Hospital Stay (HOSPITAL_COMMUNITY)
Admission: EM | Admit: 2020-11-24 | Discharge: 2020-12-04 | DRG: 501 | Disposition: A | Discharge status: Skilled Nursing Facility | Payer: 59 | Attending: Family Medicine | Admitting: Family Medicine

## 2020-11-24 ENCOUNTER — Encounter (HOSPITAL_COMMUNITY): Payer: Self-pay

## 2020-11-24 DIAGNOSIS — H6091 Unspecified otitis externa, right ear: Secondary | ICD-10-CM | POA: Diagnosis present

## 2020-11-24 DIAGNOSIS — I10 Essential (primary) hypertension: Secondary | ICD-10-CM | POA: Diagnosis present

## 2020-11-24 DIAGNOSIS — R7401 Elevation of levels of liver transaminase levels: Secondary | ICD-10-CM | POA: Diagnosis present

## 2020-11-24 DIAGNOSIS — Z20822 Contact with and (suspected) exposure to covid-19: Secondary | ICD-10-CM | POA: Diagnosis present

## 2020-11-24 DIAGNOSIS — E785 Hyperlipidemia, unspecified: Secondary | ICD-10-CM | POA: Diagnosis present

## 2020-11-24 DIAGNOSIS — Z83511 Family history of glaucoma: Secondary | ICD-10-CM

## 2020-11-24 DIAGNOSIS — T383X6A Underdosing of insulin and oral hypoglycemic [antidiabetic] drugs, initial encounter: Secondary | ICD-10-CM | POA: Diagnosis present

## 2020-11-24 DIAGNOSIS — M3322 Polymyositis with myopathy: Principal | ICD-10-CM | POA: Diagnosis present

## 2020-11-24 DIAGNOSIS — D638 Anemia in other chronic diseases classified elsewhere: Secondary | ICD-10-CM | POA: Diagnosis present

## 2020-11-24 DIAGNOSIS — I110 Hypertensive heart disease with heart failure: Secondary | ICD-10-CM | POA: Diagnosis present

## 2020-11-24 DIAGNOSIS — Y92009 Unspecified place in unspecified non-institutional (private) residence as the place of occurrence of the external cause: Secondary | ICD-10-CM

## 2020-11-24 DIAGNOSIS — Z833 Family history of diabetes mellitus: Secondary | ICD-10-CM

## 2020-11-24 DIAGNOSIS — Z6841 Body Mass Index (BMI) 40.0 and over, adult: Secondary | ICD-10-CM

## 2020-11-24 DIAGNOSIS — E1140 Type 2 diabetes mellitus with diabetic neuropathy, unspecified: Secondary | ICD-10-CM | POA: Diagnosis present

## 2020-11-24 DIAGNOSIS — Z8249 Family history of ischemic heart disease and other diseases of the circulatory system: Secondary | ICD-10-CM

## 2020-11-24 DIAGNOSIS — Z820 Family history of epilepsy and other diseases of the nervous system: Secondary | ICD-10-CM

## 2020-11-24 DIAGNOSIS — R29898 Other symptoms and signs involving the musculoskeletal system: Secondary | ICD-10-CM

## 2020-11-24 DIAGNOSIS — Z91120 Patient's intentional underdosing of medication regimen due to financial hardship: Secondary | ICD-10-CM

## 2020-11-24 DIAGNOSIS — G729 Myopathy, unspecified: Secondary | ICD-10-CM | POA: Diagnosis not present

## 2020-11-24 DIAGNOSIS — R059 Cough, unspecified: Secondary | ICD-10-CM

## 2020-11-24 DIAGNOSIS — Z794 Long term (current) use of insulin: Secondary | ICD-10-CM

## 2020-11-24 DIAGNOSIS — F419 Anxiety disorder, unspecified: Secondary | ICD-10-CM | POA: Diagnosis present

## 2020-11-24 DIAGNOSIS — E119 Type 2 diabetes mellitus without complications: Secondary | ICD-10-CM

## 2020-11-24 DIAGNOSIS — R748 Abnormal levels of other serum enzymes: Secondary | ICD-10-CM | POA: Diagnosis present

## 2020-11-24 DIAGNOSIS — E1165 Type 2 diabetes mellitus with hyperglycemia: Secondary | ICD-10-CM | POA: Diagnosis present

## 2020-11-24 DIAGNOSIS — M60005 Infective myositis, unspecified leg: Secondary | ICD-10-CM

## 2020-11-24 DIAGNOSIS — R000 Tachycardia, unspecified: Secondary | ICD-10-CM | POA: Diagnosis present

## 2020-11-24 DIAGNOSIS — R7989 Other specified abnormal findings of blood chemistry: Secondary | ICD-10-CM | POA: Diagnosis present

## 2020-11-24 DIAGNOSIS — I5032 Chronic diastolic (congestive) heart failure: Secondary | ICD-10-CM | POA: Diagnosis present

## 2020-11-24 DIAGNOSIS — M609 Myositis, unspecified: Secondary | ICD-10-CM | POA: Diagnosis present

## 2020-11-24 DIAGNOSIS — R0789 Other chest pain: Secondary | ICD-10-CM | POA: Diagnosis present

## 2020-11-24 LAB — CK: Total CK: 7517 U/L — ABNORMAL HIGH (ref 38–234)

## 2020-11-24 LAB — COMPREHENSIVE METABOLIC PANEL
ALT: 232 U/L — ABNORMAL HIGH (ref 0–44)
AST: 114 U/L — ABNORMAL HIGH (ref 15–41)
Albumin: 3.8 g/dL (ref 3.5–5.0)
Alkaline Phosphatase: 67 U/L (ref 38–126)
Anion gap: 11 (ref 5–15)
BUN: 21 mg/dL — ABNORMAL HIGH (ref 6–20)
CO2: 26 mmol/L (ref 22–32)
Calcium: 9.5 mg/dL (ref 8.9–10.3)
Chloride: 97 mmol/L — ABNORMAL LOW (ref 98–111)
Creatinine, Ser: 0.7 mg/dL (ref 0.44–1.00)
GFR, Estimated: >60 mL/min (ref >60)
Glucose, Bld: 360 mg/dL — ABNORMAL HIGH (ref 70–99)
Potassium: 4.5 mmol/L (ref 3.5–5.1)
Sodium: 134 mmol/L — ABNORMAL LOW (ref 135–145)
Total Bilirubin: 0.6 mg/dL (ref 0.3–1.2)
Total Protein: 7.4 g/dL (ref 6.5–8.1)

## 2020-11-24 LAB — CBC WITH DIFFERENTIAL/PLATELET
Abs Immature Granulocytes: 0.02 10*3/uL (ref 0.00–0.07)
Basophils Absolute: 0 10*3/uL (ref 0.0–0.1)
Basophils Relative: 0 %
Eosinophils Absolute: 0.2 10*3/uL (ref 0.0–0.5)
Eosinophils Relative: 3 %
HCT: 36.5 % (ref 36.0–46.0)
Hemoglobin: 11.1 g/dL — ABNORMAL LOW (ref 12.0–15.0)
Immature Granulocytes: 0 %
Lymphocytes Relative: 26 %
Lymphs Abs: 1.8 10*3/uL (ref 0.7–4.0)
MCH: 24.9 pg — ABNORMAL LOW (ref 26.0–34.0)
MCHC: 30.4 g/dL (ref 30.0–36.0)
MCV: 82 fL (ref 80.0–100.0)
Monocytes Absolute: 0.6 10*3/uL (ref 0.1–1.0)
Monocytes Relative: 8 %
Neutro Abs: 4.3 10*3/uL (ref 1.7–7.7)
Neutrophils Relative %: 63 %
Platelets: 327 10*3/uL (ref 150–400)
RBC: 4.45 MIL/uL (ref 3.87–5.11)
RDW: 14.7 % (ref 11.5–15.5)
WBC: 6.9 10*3/uL (ref 4.0–10.5)
nRBC: 0 % (ref 0.0–0.2)

## 2020-11-24 LAB — POC OCCULT BLOOD, ED: Fecal Occult Bld: NEGATIVE

## 2020-11-24 LAB — BRAIN NATRIURETIC PEPTIDE: B Natriuretic Peptide: 19.6 pg/mL (ref 0.0–100.0)

## 2020-11-24 MED ORDER — SODIUM CHLORIDE 0.9 % IV BOLUS
1000.0000 mL | Freq: Once | INTRAVENOUS | Status: AC
  Administered 2020-11-24: 1000 mL via INTRAVENOUS

## 2020-11-24 MED ORDER — SODIUM CHLORIDE 0.9 % IV BOLUS (SEPSIS)
500.0000 mL | Freq: Once | INTRAVENOUS | Status: AC
  Administered 2020-11-24: 500 mL via INTRAVENOUS

## 2020-11-24 NOTE — ED Triage Notes (Signed)
Pt coming from home with bilateral leg weakness.  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  EMS reports pt's sister called for a welfare check on the patient and pt had been sitting on the toilet for approximately four hours because she was not able to stand from toilet.  Pt states she has not been to her pcp since September of least year and at the last appointment the pcp mentioned a possible muscle biopsy in her future, pt has not been back to the pcp since. EMS reports glucose of 425.

## 2020-11-24 NOTE — ED Provider Notes (Signed)
[facility] DEPT Provider Note   CSN: [id number] Arrival date & time: 11/24/20  2031     History Chief Complaint  Patient presents with  . Extremity Weakness    Kristine Bauer is a 55 y.o. female.  Patient has diabetes and neuropathy myopathy.  Patient's neurologist wanted to get biopsies of her muscles to determine why her legs are getting weaker and weaker but the patient has refused.  Patient states that her strength has gotten worse and she got on the commode and could not get back up.  She was checked by her sister and then EMS was called.  She was sitting on the commode for 4 hours.  Patient does not complain of any bowel or bladder problems  The history is provided by the patient and medical records. No language interpreter was used.  Weakness Severity:  Moderate Onset quality:  Gradual Timing:  Constant Progression:  Worsening Chronicity:  Chronic Context: not alcohol use   Relieved by:  Nothing Worsened by:  Nothing Ineffective treatments:  None tried Associated symptoms: no abdominal pain, no chest pain, no cough, no diarrhea, no frequency, no headaches and no seizures        Past Medical History:  Diagnosis Date  . Allergic rhinitis   . Anemia   . Asthma   . Chest pain    LEFT SIDED-SEVERAL YEARS  . Chronic diastolic heart failure ([place]) 01/01/2020  . Diabetes mellitus, type 2 ([place])   . ETD (Eustachian tube dysfunction), bilateral 04/20/2017  . GERD (gastroesophageal reflux disease)   . HTN (hypertension)   . Hyperlipidemia   . Mild nonproliferative diabetic retinopathy of both eyes ([place])   . Nasal turbinate hypertrophy 04/20/2017  . Vitamin D deficiency     Patient Active Problem List   Diagnosis Date Noted  . Chronic diastolic heart failure ([place]) 01/01/2020  . Leg edema 11/08/2019  . Proximal leg weakness 08/12/2018  . ETD (Eustachian tube dysfunction), bilateral 04/20/2017  . Nasal turbinate hypertrophy 04/20/2017   . Seasonal allergic rhinitis 04/20/2017  . Diabetes mellitus, type 2 ([place])   . Chest pain of uncertain etiology   . HTN (hypertension)   . Asthma   . GERD (gastroesophageal reflux disease)   . Anemia     Past Surgical History:  Procedure Laterality Date  . ENDOMETRIAL BIOPSY       OB History   No obstetric history on file.     Family History  Problem Relation Age of Onset  . Hypertension Father   . Glaucoma Father   . Hypertension Mother   . Diabetes type II Mother   . Multiple sclerosis Sister   . Breast cancer Neg Hx     Social History   Tobacco Use  . Smoking status: Never Smoker  . Smokeless tobacco: Never Used  Vaping Use  . Vaping Use: Never used  Substance Use Topics  . Alcohol use: No  . Drug use: Never    Home Medications Prior to Admission medications   Medication Sig Start Date End Date Taking? Authorizing Provider  albuterol (VENTOLIN HFA) 108 (90 Base) MCG/ACT inhaler Inhale 1-2 puffs into the lungs every 6 (six) hours as needed for wheezing or shortness of breath. 01/04/20   Nicolette Bang, DO  amLODipine (NORVASC) 5 MG tablet Take 1 tablet (5 mg total) by mouth daily. 04/19/20   Nahser, Wonda Cheng, MD  azelastine (OPTIVAR) 0.05 % ophthalmic solution  12/22/19   [provider]  cyclobenzaprine (FLEXERIL) 10 MG tablet Take 0.5 tablets (5 mg total) by mouth every 8 (eight) hours as needed for muscle spasms. 08/17/20   Peri Jefferson, PA-C  D3 50 MCG (2000 UT) TABS TAKE 1 TABLET BY MOUTH EVERY DAY 07/02/20   Fulp, Cammie, MD  diclofenac (VOLTAREN) 50 MG EC tablet Take 1 tablet (50 mg total) by mouth 2 (two) times daily. 06/03/20   Mayers, Cari S, PA-C  fluticasone (FLONASE) 50 MCG/ACT nasal spray Place 2 sprays into both nostrils daily. 06/21/20   Nicolette Bang, DO  gabapentin (NEURONTIN) 300 MG capsule TAKE 1 CAPSULE BY MOUTH THREE TIMES A DAY 07/19/20   Hyatt, Max T, DPM  glimepiride (AMARYL) 4 MG tablet TAKE 1 TABLET BY  MOUTH 2 TIMES DAILY. 08/05/20   Nicolette Bang, DO  glucose blood (PRODIGY NO CODING BLOOD GLUC) test strip Use as instructed to check blood sugar twice daily. E11.8 01/25/20   Charlott Rakes, MD  hydrochlorothiazide (HYDRODIURIL) 25 MG tablet Take 1 tablet (25 mg total) by mouth daily. 06/03/20   Mayers, Cari S, PA-C  hydrOXYzine (VISTARIL) 25 MG capsule Take 1 capsule (25 mg total) by mouth at bedtime as needed. 06/03/20   Mayers, Cari S, PA-C  insulin glargine (LANTUS SOLOSTAR) 100 UNIT/ML Solostar Pen Inject 10 Units into the skin daily. 01/08/20   Charlott Rakes, MD  Insulin Pen Needle (TRUEPLUS PEN NEEDLES) 32G X 4 MM MISC Use as instructed to inject insulin daily. 01/15/20   Charlott Rakes, MD  Lancets (ACCU-CHEK MULTICLIX) lancets 1 each by Other route 3 (three) times daily. 03/19/17   [provider]  metFORMIN (GLUCOPHAGE-XR) 500 MG 24 hr tablet Take 3 tablets (1,500 mg total) by mouth daily with breakfast. 07/23/20   Nicolette Bang, DO  montelukast (SINGULAIR) 10 MG tablet TAKE 1 TABLET BY MOUTH EVERYDAY AT BEDTIME 10/25/20   Nicolette Bang, DO  Multiple Vitamins-Iron (DAILY MULTIVITAMINS/IRON PO) Take 1 tablet by mouth daily.     [provider]  pantoprazole (PROTONIX) 40 MG tablet TAKE 1 TABLET BY MOUTH EVERY DAY 08/05/20   Nicolette Bang, DO  sitaGLIPtin (JANUVIA) 100 MG tablet Take 100 mg by mouth daily.    [provider]  tiZANidine (ZANAFLEX) 4 MG tablet Take 1 tablet (4 mg total) by mouth at bedtime. 06/03/20   Mayers, Cari S, PA-C  valsartan (DIOVAN) 160 MG tablet TAKE 1 TABLET BY MOUTH EVERY DAY 09/10/20   Nahser, Wonda Cheng, MD    Allergies    Statins  Review of Systems   Review of Systems  Constitutional: Negative for appetite change and fatigue.  HENT: Negative for congestion, ear discharge and sinus pressure.   Eyes: Negative for discharge.  Respiratory: Negative for cough.   Cardiovascular: Negative for  chest pain.  Gastrointestinal: Negative for abdominal pain and diarrhea.  Genitourinary: Negative for frequency and hematuria.  Musculoskeletal: Negative for back pain.  Skin: Negative for rash.  Neurological: Positive for weakness. Negative for seizures and headaches.  Psychiatric/Behavioral: Negative for hallucinations.    Physical Exam Updated Vital Signs BP 124/73   Pulse (!) 104   Temp 97.6 F (36.4 C) (Oral)   Resp 18   SpO2 100%   Physical Exam Vitals and nursing note reviewed.  Constitutional:      Appearance: She is well-developed.  HENT:     Head: Normocephalic.     Nose: Nose normal.  Eyes:     General: No scleral icterus.  Conjunctiva/sclera: Conjunctivae normal.  Neck:     Thyroid: No thyromegaly.  Cardiovascular:     Rate and Rhythm: Normal rate and regular rhythm.     Heart sounds: No murmur heard. No friction rub. No gallop.   Pulmonary:     Breath sounds: No stridor. No wheezing or rales.  Chest:     Chest wall: No tenderness.  Abdominal:     General: There is no distension.     Tenderness: There is no abdominal tenderness. There is no rebound.  Genitourinary:    Comments: Rectal exam patient has a normal sphincter tone Musculoskeletal:     Cervical back: Neck supple.     Comments: Patient has profound weakness in both legs and she can only lift them off the bed about 2 inches.  She cannot hold the legs up.  Patient does not have decreased sensation.  Patient does not have any weakness in her arms  Lymphadenopathy:     Cervical: No cervical adenopathy.  Skin:    Findings: No erythema or rash.  Neurological:     Mental Status: She is alert and oriented to person, place, and time.     Motor: No abnormal muscle tone.     Coordination: Coordination normal.  Psychiatric:        Behavior: Behavior normal.     ED Results / Procedures / Treatments   Labs (all labs ordered are listed, but only abnormal results are displayed) Labs Reviewed  CBC  WITH DIFFERENTIAL/PLATELET - Abnormal; Notable for the following components:      Result Value   Hemoglobin 11.1 (*)    MCH 24.9 (*)    All other components within normal limits  COMPREHENSIVE METABOLIC PANEL  CK  BRAIN NATRIURETIC PEPTIDE  OCCULT BLOOD X 1 CARD TO LAB, STOOL    EKG None  Radiology No results found.  Procedures Procedures   Medications Ordered in ED Medications  sodium chloride 0.9 % bolus 500 mL (has no administration in time range)    ED Course  I have reviewed the triage vital signs and the nursing notes.  Pertinent labs & imaging results that were available during my care of the patient were reviewed by me and considered in my medical decision making (see chart for details).   Patient with profound weakness in her legs which has been progressive over more than a year.   MDM Rules/Calculators/A&P                         Patient with progressive weakness to her legs.  I have spoke with neurology and they recommend MRI of lumbar and thoracic spine.  She will be admitted to medicine and get those studies tomorrow with neurology consult  Final Clinical Impression(s) / ED Diagnoses Final diagnoses:  None    Rx / DC Orders ED Discharge Orders    None       Milton Ferguson, MD 11/25/20 1053

## 2020-11-25 ENCOUNTER — Inpatient Hospital Stay (HOSPITAL_COMMUNITY): Payer: 59

## 2020-11-25 ENCOUNTER — Encounter: Payer: Self-pay | Admitting: Cardiovascular Disease

## 2020-11-25 ENCOUNTER — Encounter: Payer: 59 | Admitting: Cardiovascular Disease

## 2020-11-25 DIAGNOSIS — Z20822 Contact with and (suspected) exposure to covid-19: Secondary | ICD-10-CM | POA: Diagnosis present

## 2020-11-25 DIAGNOSIS — G729 Myopathy, unspecified: Secondary | ICD-10-CM | POA: Diagnosis present

## 2020-11-25 DIAGNOSIS — H6091 Unspecified otitis externa, right ear: Secondary | ICD-10-CM | POA: Diagnosis present

## 2020-11-25 DIAGNOSIS — F419 Anxiety disorder, unspecified: Secondary | ICD-10-CM | POA: Diagnosis present

## 2020-11-25 DIAGNOSIS — Z6841 Body Mass Index (BMI) 40.0 and over, adult: Secondary | ICD-10-CM | POA: Diagnosis not present

## 2020-11-25 DIAGNOSIS — D638 Anemia in other chronic diseases classified elsewhere: Secondary | ICD-10-CM | POA: Diagnosis present

## 2020-11-25 DIAGNOSIS — E1169 Type 2 diabetes mellitus with other specified complication: Secondary | ICD-10-CM

## 2020-11-25 DIAGNOSIS — T383X6A Underdosing of insulin and oral hypoglycemic [antidiabetic] drugs, initial encounter: Secondary | ICD-10-CM | POA: Diagnosis present

## 2020-11-25 DIAGNOSIS — E1165 Type 2 diabetes mellitus with hyperglycemia: Secondary | ICD-10-CM | POA: Diagnosis present

## 2020-11-25 DIAGNOSIS — Z91120 Patient's intentional underdosing of medication regimen due to financial hardship: Secondary | ICD-10-CM | POA: Diagnosis not present

## 2020-11-25 DIAGNOSIS — I110 Hypertensive heart disease with heart failure: Secondary | ICD-10-CM | POA: Diagnosis present

## 2020-11-25 DIAGNOSIS — R29898 Other symptoms and signs involving the musculoskeletal system: Secondary | ICD-10-CM | POA: Diagnosis not present

## 2020-11-25 DIAGNOSIS — I10 Essential (primary) hypertension: Secondary | ICD-10-CM | POA: Diagnosis not present

## 2020-11-25 DIAGNOSIS — Z794 Long term (current) use of insulin: Secondary | ICD-10-CM | POA: Diagnosis not present

## 2020-11-25 DIAGNOSIS — E1140 Type 2 diabetes mellitus with diabetic neuropathy, unspecified: Secondary | ICD-10-CM | POA: Diagnosis present

## 2020-11-25 DIAGNOSIS — M3322 Polymyositis with myopathy: Secondary | ICD-10-CM | POA: Diagnosis present

## 2020-11-25 DIAGNOSIS — Z833 Family history of diabetes mellitus: Secondary | ICD-10-CM | POA: Diagnosis not present

## 2020-11-25 DIAGNOSIS — R748 Abnormal levels of other serum enzymes: Secondary | ICD-10-CM

## 2020-11-25 DIAGNOSIS — R000 Tachycardia, unspecified: Secondary | ICD-10-CM | POA: Diagnosis present

## 2020-11-25 DIAGNOSIS — Z8249 Family history of ischemic heart disease and other diseases of the circulatory system: Secondary | ICD-10-CM | POA: Diagnosis not present

## 2020-11-25 DIAGNOSIS — M609 Myositis, unspecified: Secondary | ICD-10-CM | POA: Diagnosis not present

## 2020-11-25 DIAGNOSIS — E785 Hyperlipidemia, unspecified: Secondary | ICD-10-CM | POA: Diagnosis present

## 2020-11-25 DIAGNOSIS — R7989 Other specified abnormal findings of blood chemistry: Secondary | ICD-10-CM | POA: Diagnosis present

## 2020-11-25 DIAGNOSIS — I5032 Chronic diastolic (congestive) heart failure: Secondary | ICD-10-CM | POA: Diagnosis present

## 2020-11-25 DIAGNOSIS — Z83511 Family history of glaucoma: Secondary | ICD-10-CM | POA: Diagnosis not present

## 2020-11-25 DIAGNOSIS — Y92009 Unspecified place in unspecified non-institutional (private) residence as the place of occurrence of the external cause: Secondary | ICD-10-CM | POA: Diagnosis not present

## 2020-11-25 DIAGNOSIS — R7401 Elevation of levels of liver transaminase levels: Secondary | ICD-10-CM | POA: Diagnosis present

## 2020-11-25 DIAGNOSIS — R0789 Other chest pain: Secondary | ICD-10-CM | POA: Diagnosis present

## 2020-11-25 DIAGNOSIS — Z820 Family history of epilepsy and other diseases of the nervous system: Secondary | ICD-10-CM | POA: Diagnosis not present

## 2020-11-25 LAB — COMPREHENSIVE METABOLIC PANEL
ALT: 184 U/L — ABNORMAL HIGH (ref 0–44)
AST: 91 U/L — ABNORMAL HIGH (ref 15–41)
Albumin: 3.3 g/dL — ABNORMAL LOW (ref 3.5–5.0)
Alkaline Phosphatase: 58 U/L (ref 38–126)
Anion gap: 9 (ref 5–15)
BUN: 15 mg/dL (ref 6–20)
CO2: 25 mmol/L (ref 22–32)
Calcium: 8.7 mg/dL — ABNORMAL LOW (ref 8.9–10.3)
Chloride: 101 mmol/L (ref 98–111)
Creatinine, Ser: 0.53 mg/dL (ref 0.44–1.00)
GFR, Estimated: >60 mL/min (ref >60)
Glucose, Bld: 301 mg/dL — ABNORMAL HIGH (ref 70–99)
Potassium: 3.6 mmol/L (ref 3.5–5.1)
Sodium: 135 mmol/L (ref 135–145)
Total Bilirubin: 0.5 mg/dL (ref 0.3–1.2)
Total Protein: 6.5 g/dL (ref 6.5–8.1)

## 2020-11-25 LAB — CBC
HCT: 33.9 % — ABNORMAL LOW (ref 36.0–46.0)
Hemoglobin: 10.4 g/dL — ABNORMAL LOW (ref 12.0–15.0)
MCH: 24.9 pg — ABNORMAL LOW (ref 26.0–34.0)
MCHC: 30.7 g/dL (ref 30.0–36.0)
MCV: 81.3 fL (ref 80.0–100.0)
Platelets: 317 10*3/uL (ref 150–400)
RBC: 4.17 MIL/uL (ref 3.87–5.11)
RDW: 14.6 % (ref 11.5–15.5)
WBC: 7.6 10*3/uL (ref 4.0–10.5)
nRBC: 0 % (ref 0.0–0.2)

## 2020-11-25 LAB — RESP PANEL BY RT-PCR (FLU A&B, COVID) ARPGX2
Influenza A by PCR: NEGATIVE
Influenza B by PCR: NEGATIVE
SARS Coronavirus 2 by RT PCR: NEGATIVE

## 2020-11-25 LAB — CBG MONITORING, ED: Glucose-Capillary: 255 mg/dL — ABNORMAL HIGH (ref 70–99)

## 2020-11-25 LAB — HIV ANTIBODY (ROUTINE TESTING W REFLEX): HIV Screen 4th Generation wRfx: NONREACTIVE

## 2020-11-25 LAB — MAGNESIUM: Magnesium: 2 mg/dL (ref 1.7–2.4)

## 2020-11-25 LAB — TROPONIN I (HIGH SENSITIVITY)
Troponin I (High Sensitivity): 13 ng/L (ref <18)
Troponin I (High Sensitivity): 13 ng/L (ref <18)

## 2020-11-25 LAB — GLUCOSE, CAPILLARY
Glucose-Capillary: 269 mg/dL — ABNORMAL HIGH (ref 70–99)
Glucose-Capillary: 234 mg/dL — ABNORMAL HIGH (ref 70–99)
Glucose-Capillary: 286 mg/dL — ABNORMAL HIGH (ref 70–99)
Glucose-Capillary: 389 mg/dL — ABNORMAL HIGH (ref 70–99)

## 2020-11-25 LAB — PHOSPHORUS: Phosphorus: 4.4 mg/dL (ref 2.5–4.6)

## 2020-11-25 LAB — HEMOGLOBIN A1C
Hgb A1c MFr Bld: 12.5 % — ABNORMAL HIGH (ref 4.8–5.6)
Mean Plasma Glucose: 312.05 mg/dL

## 2020-11-25 MED ORDER — GABAPENTIN 300 MG PO CAPS
300.0000 mg | ORAL_CAPSULE | Freq: Once | ORAL | Status: AC
  Administered 2020-11-25: 300 mg via ORAL
  Filled 2020-11-25: qty 1

## 2020-11-25 MED ORDER — GABAPENTIN 300 MG PO CAPS
300.0000 mg | ORAL_CAPSULE | Freq: Three times a day (TID) | ORAL | Status: DC
  Administered 2020-11-25 (×2): 300 mg via ORAL
  Filled 2020-11-25 (×2): qty 1

## 2020-11-25 MED ORDER — SALINE SPRAY 0.65 % NA SOLN
1.0000 | NASAL | Status: DC | PRN
  Administered 2020-11-25: 1 via NASAL
  Filled 2020-11-25: qty 44

## 2020-11-25 MED ORDER — ENOXAPARIN SODIUM 40 MG/0.4ML ~~LOC~~ SOLN
40.0000 mg | SUBCUTANEOUS | Status: DC
  Administered 2020-11-25 – 2020-12-04 (×9): 40 mg via SUBCUTANEOUS
  Filled 2020-11-25 (×10): qty 0.4

## 2020-11-25 MED ORDER — ONDANSETRON HCL 4 MG PO TABS
4.0000 mg | ORAL_TABLET | Freq: Four times a day (QID) | ORAL | Status: DC | PRN
  Administered 2020-12-01 – 2020-12-03 (×2): 4 mg via ORAL
  Filled 2020-11-25 (×2): qty 1

## 2020-11-25 MED ORDER — INSULIN GLARGINE 100 UNIT/ML ~~LOC~~ SOLN
20.0000 [IU] | Freq: Every day | SUBCUTANEOUS | Status: DC
  Administered 2020-11-25: 20 [IU] via SUBCUTANEOUS
  Filled 2020-11-25: qty 0.2

## 2020-11-25 MED ORDER — OXYMETAZOLINE HCL 0.05 % NA SOLN
1.0000 | Freq: Two times a day (BID) | NASAL | Status: DC
  Administered 2020-11-25 – 2020-12-04 (×14): 1 via NASAL
  Filled 2020-11-25 (×2): qty 15

## 2020-11-25 MED ORDER — ACETAMINOPHEN 325 MG PO TABS
650.0000 mg | ORAL_TABLET | Freq: Four times a day (QID) | ORAL | Status: DC | PRN
  Administered 2020-11-25 – 2020-12-04 (×14): 650 mg via ORAL
  Filled 2020-11-25 (×14): qty 2

## 2020-11-25 MED ORDER — MONTELUKAST SODIUM 10 MG PO TABS
10.0000 mg | ORAL_TABLET | Freq: Every day | ORAL | Status: DC
  Administered 2020-11-25 – 2020-12-03 (×9): 10 mg via ORAL
  Filled 2020-11-25 (×9): qty 1

## 2020-11-25 MED ORDER — INSULIN ASPART 100 UNIT/ML ~~LOC~~ SOLN
0.0000 [IU] | Freq: Every day | SUBCUTANEOUS | Status: DC
  Administered 2020-11-25: 5 [IU] via SUBCUTANEOUS
  Administered 2020-11-25: 3 [IU] via SUBCUTANEOUS
  Administered 2020-11-26 – 2020-12-01 (×4): 4 [IU] via SUBCUTANEOUS
  Administered 2020-12-02: 3 [IU] via SUBCUTANEOUS
  Administered 2020-12-03: 4 [IU] via SUBCUTANEOUS
  Filled 2020-11-25: qty 0.05

## 2020-11-25 MED ORDER — ALPRAZOLAM 0.5 MG PO TABS
0.5000 mg | ORAL_TABLET | Freq: Three times a day (TID) | ORAL | Status: DC | PRN
  Administered 2020-11-25 – 2020-11-29 (×8): 0.5 mg via ORAL
  Filled 2020-11-25 (×8): qty 1

## 2020-11-25 MED ORDER — IRBESARTAN 150 MG PO TABS
150.0000 mg | ORAL_TABLET | Freq: Every day | ORAL | Status: DC
  Administered 2020-11-25 – 2020-12-04 (×9): 150 mg via ORAL
  Filled 2020-11-25 (×9): qty 1

## 2020-11-25 MED ORDER — POLYETHYLENE GLYCOL 3350 17 G PO PACK: 17.0000 g | PACK | Freq: Every day | ORAL | Status: DC | PRN

## 2020-11-25 MED ORDER — INSULIN GLARGINE 100 UNIT/ML ~~LOC~~ SOLN
28.0000 [IU] | Freq: Every day | SUBCUTANEOUS | Status: DC
  Administered 2020-11-25 – 2020-11-28 (×4): 28 [IU] via SUBCUTANEOUS
  Filled 2020-11-25 (×4): qty 0.28

## 2020-11-25 MED ORDER — GABAPENTIN 300 MG PO CAPS
600.0000 mg | ORAL_CAPSULE | Freq: Two times a day (BID) | ORAL | Status: DC
  Administered 2020-11-25 – 2020-11-26 (×3): 600 mg via ORAL
  Filled 2020-11-25 (×4): qty 2

## 2020-11-25 MED ORDER — ACETAMINOPHEN 650 MG RE SUPP: 650.0000 mg | Freq: Four times a day (QID) | RECTAL | Status: DC | PRN

## 2020-11-25 MED ORDER — AMLODIPINE BESYLATE 5 MG PO TABS
5.0000 mg | ORAL_TABLET | Freq: Every day | ORAL | Status: DC
  Administered 2020-11-25 – 2020-12-04 (×9): 5 mg via ORAL
  Filled 2020-11-25 (×9): qty 1

## 2020-11-25 MED ORDER — POTASSIUM CHLORIDE CRYS ER 20 MEQ PO TBCR
40.0000 meq | EXTENDED_RELEASE_TABLET | Freq: Once | ORAL | Status: AC
  Administered 2020-11-25: 40 meq via ORAL
  Filled 2020-11-25: qty 2

## 2020-11-25 MED ORDER — ONDANSETRON HCL 4 MG/2ML IJ SOLN
4.0000 mg | Freq: Four times a day (QID) | INTRAMUSCULAR | Status: DC | PRN
  Administered 2020-11-30: 4 mg via INTRAVENOUS
  Filled 2020-11-25: qty 2

## 2020-11-25 MED ORDER — INSULIN ASPART 100 UNIT/ML ~~LOC~~ SOLN
0.0000 [IU] | Freq: Three times a day (TID) | SUBCUTANEOUS | Status: DC
  Administered 2020-11-25: 5 [IU] via SUBCUTANEOUS
  Administered 2020-11-25 (×2): 8 [IU] via SUBCUTANEOUS
  Administered 2020-11-26: 11 [IU] via SUBCUTANEOUS
  Administered 2020-11-26 – 2020-11-27 (×3): 5 [IU] via SUBCUTANEOUS
  Administered 2020-11-27: 3 [IU] via SUBCUTANEOUS
  Administered 2020-11-28: 11 [IU] via SUBCUTANEOUS
  Administered 2020-11-28 (×2): 8 [IU] via SUBCUTANEOUS
  Administered 2020-11-29: 11 [IU] via SUBCUTANEOUS
  Administered 2020-11-29: 3 [IU] via SUBCUTANEOUS
  Administered 2020-11-29: 8 [IU] via SUBCUTANEOUS
  Administered 2020-11-30: 2 [IU] via SUBCUTANEOUS
  Administered 2020-11-30: 3 [IU] via SUBCUTANEOUS
  Administered 2020-11-30: 2 [IU] via SUBCUTANEOUS
  Administered 2020-12-01 (×2): 8 [IU] via SUBCUTANEOUS
  Administered 2020-12-01: 3 [IU] via SUBCUTANEOUS
  Administered 2020-12-02: 11 [IU] via SUBCUTANEOUS
  Administered 2020-12-02: 5 [IU] via SUBCUTANEOUS
  Administered 2020-12-02: 8 [IU] via SUBCUTANEOUS
  Administered 2020-12-03: 3 [IU] via SUBCUTANEOUS
  Administered 2020-12-03 – 2020-12-04 (×3): 5 [IU] via SUBCUTANEOUS
  Administered 2020-12-04: 11 [IU] via SUBCUTANEOUS
  Administered 2020-12-04: 8 [IU] via SUBCUTANEOUS
  Filled 2020-11-25: qty 0.15

## 2020-11-25 MED ORDER — FLUTICASONE PROPIONATE 50 MCG/ACT NA SUSP
2.0000 | Freq: Every day | NASAL | Status: DC
  Administered 2020-11-25: 2 via NASAL
  Filled 2020-11-25: qty 16

## 2020-11-25 MED ORDER — PANTOPRAZOLE SODIUM 40 MG PO TBEC
40.0000 mg | DELAYED_RELEASE_TABLET | Freq: Every day | ORAL | Status: DC
  Administered 2020-11-25 – 2020-12-04 (×9): 40 mg via ORAL
  Filled 2020-11-25 (×9): qty 1

## 2020-11-25 MED ORDER — OXYCODONE HCL 5 MG PO TABS
5.0000 mg | ORAL_TABLET | Freq: Once | ORAL | Status: AC
Start: 2020-11-25 — End: 2020-11-25
  Administered 2020-11-25: 5 mg via ORAL
  Filled 2020-11-25: qty 1

## 2020-11-25 NOTE — Progress Notes (Signed)
Pt is in the hospital  appt has been cancelled.   This encounter was created in error - please disregard.

## 2020-11-25 NOTE — ED Notes (Signed)
Pt resting comfortably, continuous vs monitoring in place, call light in reach, side rails upx2. Warm blankets provided.

## 2020-11-25 NOTE — ED Notes (Signed)
Pt resting comfortably, continuous vs monitoring in place, call light in reach, side rails upx2.

## 2020-11-25 NOTE — ED Notes (Signed)
Pt a&ox4, continuous vs monitoring in place, call light in reach, side rails upx2. Warm blankets provided.

## 2020-11-25 NOTE — Progress Notes (Signed)
On-call neurology note Spoke with Dr. Maryland Pink at [facility], who called regarding this patient. Patient with bilateral lower extremity weakness that has been progressively worse and more painful.. Seen by outpatient neurology with recommendations for biopsy, which she has refused in the past. Admitting inpatient team spoke with on-call neurologist overnight yesterday and on-call neurologist during the day-with recommendations for spine imaging-imaging thus far unremarkable to explain pain and weakness in both legs.   CK elevated.  Has already been evaluated by outpatient neurologist Dr. 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 Will recommend nerve muscle biopsy at this time and outpatient neurology follow-up with Dr. Posey Pronto, neuromuscular neurologist-as has been recommended in the past.  Stay off of statins. IV hydration. Not sure of a use of a formal inpatient consultation at this time but would definitely need continued outpatient follow-up after the biopsy which will determine the best course of long-term treatment. I will send a message to Dr. Posey Pronto to see if she has any further recommendations for inpatient management and based on that we can decide if she needs a formal inpatient consultation. Please call with questions as needed.  -- Amie Portland, MD Neurologist [facility] Pager: [phone]

## 2020-11-25 NOTE — Evaluation (Signed)
Physical Therapy Evaluation Patient Details Name: Kristine Bauer MRN: [id number] DOB: 1966-04-05 Today's Date: 11/25/2020   History of Present Illness  55 yo female admitted with LE myositis. Hx of irritable myopathy, myositis, DM, CHF, obesity, neuropathy, asthma, falls  Clinical Impression  On eval, pt required Min guard-Min assist for mobility. She walked ~60 feet with a Rw. Pt presents with general weakness, decreased activity tolerance, and impaired gait and balance. She c/o legs feeling as if they want to give out while ambulating. She is at risk for falls. Discussed d/c plan-pt is undecided at this time. Will continue to follow and progress activity as tolerated. Pt lives alone and has had difficulty mobilizing PTA (she had to call EMS out to help her get up her stairs and into her apt and also to get her off the toilet).     Follow Up Recommendations SNF vs Home health PT (pt undecided-may not agree to rehab placement)    Equipment Recommendations  Rolling walker with 5" wheels    Recommendations for Other Services       Precautions / Restrictions Precautions Precautions: Fall Restrictions Weight Bearing Restrictions: No      Mobility  Bed Mobility Overal bed mobility: Modified Independent             General bed mobility comments: increased time.    Transfers Overall transfer level: Needs assistance Equipment used: Rolling walker (2 wheeled) Transfers: Sit to/from Stand Sit to Stand: Min guard         General transfer comment: cues for safety, hand placement. increased time.  Ambulation/Gait Ambulation/Gait assistance: Min assist Gait Distance (Feet): 60 Feet Assistive device: Rolling walker (2 wheeled) Gait Pattern/deviations: Step-through pattern;Decreased step length - right;Decreased step length - left;Decreased stride length     General Gait Details: intermittent assist to steady after ~30 feet. pt fatigues easily. pt stated "I feel like my  legs might give out. They feel heavy."  Stairs            Wheelchair Mobility    Modified Rankin (Stroke Patients Only)       Balance Overall balance assessment: Needs assistance;History of Falls         Standing balance support: Bilateral upper extremity supported Standing balance-Leahy Scale: Fair                               Pertinent Vitals/Pain Pain Assessment: 0-10 Pain Score: 10-Worst pain ever Pain Location: bil feet Pain Descriptors / Indicators: Sore;Aching;Discomfort Pain Intervention(s): Limited activity within patient's tolerance;Monitored during session;Repositioned    Home Living Family/patient expects to be discharged to:: Private residence Living Arrangements: Alone   Type of Home: Apartment Home Access: Stairs to enter Entrance Stairs-Rails: Right Entrance Stairs-Number of Steps: 1 flight Home Layout: One level Home Equipment: Cane - single point      Prior Function Level of Independence: Independent with assistive device(s)         Comments: independent up until recently. using cane for last week or so     Hand Dominance        Extremity/Trunk Assessment   Upper Extremity Assessment Upper Extremity Assessment: Defer to OT evaluation    Lower Extremity Assessment Lower Extremity Assessment: Generalized weakness;RLE deficits/detail;LLE deficits/detail RLE Sensation: history of peripheral neuropathy LLE Sensation: history of peripheral neuropathy    Cervical / Trunk Assessment Cervical / Trunk Assessment: Normal  Communication   Communication: No difficulties  Cognition Arousal/Alertness: Awake/alert Behavior During Therapy: WFL for tasks assessed/performed Overall Cognitive Status: Within Functional Limits for tasks assessed                                        General Comments      Exercises     Assessment/Plan    PT Assessment Patient needs continued PT services  PT Problem List  Decreased strength;Decreased activity tolerance;Decreased balance;Decreased mobility;Obesity;Decreased knowledge of use of DME       PT Treatment Interventions DME instruction;Gait training;Therapeutic exercise;Balance training;Functional mobility training;Therapeutic activities;Patient/family education    PT Goals (Current goals can be found in the Care Plan section)  Acute Rehab PT Goals Patient Stated Goal: to regain independence PT Goal Formulation: With patient Time For Goal Achievement: 12/09/20 Potential to Achieve Goals: Good    Frequency Min 3X/week   Barriers to discharge Decreased caregiver support      Co-evaluation               AM-PAC PT "6 Clicks" Mobility  Outcome Measure Help needed turning from your back to your side while in a flat bed without using bedrails?: None Help needed moving from lying on your back to sitting on the side of a flat bed without using bedrails?: None Help needed moving to and from a bed to a chair (including a wheelchair)?: A Little Help needed standing up from a chair using your arms (e.g., wheelchair or bedside chair)?: A Little Help needed to walk in hospital room?: A Little Help needed climbing 3-5 steps with a railing? : A Lot 6 Click Score: 19    End of Session Equipment Utilized During Treatment: Gait belt Activity Tolerance: Patient limited by fatigue;Patient limited by pain Patient left: in chair;with call bell/phone within reach;with chair alarm set   PT Visit Diagnosis: Pain;Difficulty in walking, not elsewhere classified (R26.2);Muscle weakness (generalized) (M62.81) Pain - part of body: Ankle and joints of foot    Time: 3094-0768 PT Time Calculation (min) (ACUTE ONLY): 19 min   Charges:   PT Evaluation $PT Eval Moderate Complexity: [place], PT Acute Rehabilitation  Office: [phone] Pager: [phone]

## 2020-11-25 NOTE — Progress Notes (Signed)
Patient fired yellow mews. Writer assessed patient. Patient c/o chest pain. EKG obtained. MD Dr. Maryland Pink paged. See new orders

## 2020-11-25 NOTE — ED Notes (Signed)
Pt resting comfortably, continuous vs monitoring in place, call light in reach, side rails upx2.  Water and graham crackers provided to pt.  Pt used bedside commode with minor assistance from ED staff.

## 2020-11-25 NOTE — H&P (Signed)
History and Physical  Patient Name: Kristine Bauer     NAT:[id number]    DOB: 1966/02/03    DOA: 11/24/2020 PCP: Nicolette Bang, DO  Patient coming from: Home  Chief Complaint: Bilateral lower extremity weakness    HPI: Kristine Bauer is a 55 y.o. female, with PMH of myositis, insulin-dependent type 2 diabetes, hypertension, diastolic CHF who presented to the ER on 11/24/2020 with complaint of unable to get off the toilet due to bilateral lower extremity weakness.  Patient has had difficulties with getting up from seated position, lower extremity weakness for over 2 years.  She has been followed by neurology outpatient and diagnosed with irritable myopathy affecting limb girdle distribution.  She had an EMG that showed these findings.  She has also had elevated CK level in the thousands.  She was previously on a statin taken off but her symptoms persisted.  She has had specific lab work related to myositis performed that is all been negative.  She has been scheduled for a biopsy x2 however has backed out due to concern of "cutting on her".  She says her weakness has continued to progress.  Day prior to presentation, she could barely get up her stairs and required assistance of EMS inside the house.  Then the day of presentation, she went to go to the bathroom and could not get up off the toilet.  She remained on the toilet for over 4 hours until her sister found her.  She was brought in via EMS for evaluation.    ED course: -Vitals on admission: Afebrile, heart rate 106, respiratory rate 15, blood pressure 145/90, maintaining sats on room air -Labs on initial presentation: Sodium 134, potassium 4.5, chloride 97, bicarb 26, glucose 260, BUN 21, creatinine 0.7, ALT 232, AST 114, BNP 19.6, WBC 6.9, hemoglobin 11.1, CK 7500, Covid negative -Imaging obtained on admission: none -In the ED the patient was given IV fluids.  Neurology was consulted in the ED, will see in consult  tomorrow, recommend MRI of thoracic and lumbar spine.  the hospitalist service was contacted for further evaluation and management.     ROS: A complete and thorough 12 point review of systems obtained, negative listed in HPI.     Past Medical History:  Diagnosis Date  . Allergic rhinitis   . Anemia   . Asthma   . Chest pain    LEFT SIDED-SEVERAL YEARS  . Chronic diastolic heart failure ([place]) 01/01/2020  . Diabetes mellitus, type 2 ([place])   . ETD (Eustachian tube dysfunction), bilateral 04/20/2017  . GERD (gastroesophageal reflux disease)   . HTN (hypertension)   . Hyperlipidemia   . Mild nonproliferative diabetic retinopathy of both eyes ([place])   . Nasal turbinate hypertrophy 04/20/2017  . Vitamin D deficiency     Past Surgical History:  Procedure Laterality Date  . ENDOMETRIAL BIOPSY      Social History: Patient lives at home.  The patient walks with a cane.  None smoker.  Allergies  Allergen Reactions  . Statins Other (See Comments)    Myopathy    Family history: family history includes Diabetes type II in her mother; Glaucoma in her father; Hypertension in her father and mother; Multiple sclerosis in her sister.  Prior to Admission medications   Medication Sig Start Date End Date Taking? Authorizing Provider  albuterol (VENTOLIN HFA) 108 (90 Base) MCG/ACT inhaler Inhale 1-2 puffs into the lungs every 6 (six) hours as needed for wheezing or shortness of  breath. 01/04/20   Nicolette Bang, DO  amLODipine (NORVASC) 5 MG tablet Take 1 tablet (5 mg total) by mouth daily. 04/19/20   Nahser, Wonda Cheng, MD  azelastine (OPTIVAR) 0.05 % ophthalmic solution  12/22/19   [provider]  cyclobenzaprine (FLEXERIL) 10 MG tablet Take 0.5 tablets (5 mg total) by mouth every 8 (eight) hours as needed for muscle spasms. 08/17/20   Peri [place], PA-C  D3 50 MCG (2000 UT) TABS TAKE 1 TABLET BY MOUTH EVERY DAY 07/02/20   Fulp, Cammie, MD  diclofenac (VOLTAREN) 50 MG EC  tablet Take 1 tablet (50 mg total) by mouth 2 (two) times daily. 06/03/20   Mayers, Cari S, PA-C  fluticasone (FLONASE) 50 MCG/ACT nasal spray Place 2 sprays into both nostrils daily. 06/21/20   Nicolette Bang, DO  gabapentin (NEURONTIN) 300 MG capsule TAKE 1 CAPSULE BY MOUTH THREE TIMES A DAY 07/19/20   Hyatt, Max T, DPM  glimepiride (AMARYL) 4 MG tablet TAKE 1 TABLET BY MOUTH 2 TIMES DAILY. 08/05/20   Nicolette Bang, DO  glucose blood (PRODIGY NO CODING BLOOD GLUC) test strip Use as instructed to check blood sugar twice daily. E11.8 01/25/20   Charlott Rakes, MD  hydrochlorothiazide (HYDRODIURIL) 25 MG tablet Take 1 tablet (25 mg total) by mouth daily. 06/03/20   Mayers, Cari S, PA-C  hydrOXYzine (VISTARIL) 25 MG capsule Take 1 capsule (25 mg total) by mouth at bedtime as needed. 06/03/20   Mayers, Cari S, PA-C  insulin glargine (LANTUS SOLOSTAR) 100 UNIT/ML Solostar Pen Inject 10 Units into the skin daily. 01/08/20   Charlott Rakes, MD  Insulin Pen Needle (TRUEPLUS PEN NEEDLES) 32G X 4 MM MISC Use as instructed to inject insulin daily. 01/15/20   Charlott Rakes, MD  Lancets (ACCU-CHEK MULTICLIX) lancets 1 each by Other route 3 (three) times daily. 03/19/17   [provider]  metFORMIN (GLUCOPHAGE-XR) 500 MG 24 hr tablet Take 3 tablets (1,500 mg total) by mouth daily with breakfast. 07/23/20   Nicolette Bang, DO  montelukast (SINGULAIR) 10 MG tablet TAKE 1 TABLET BY MOUTH EVERYDAY AT BEDTIME 10/25/20   Nicolette Bang, DO  Multiple Vitamins-Iron (DAILY MULTIVITAMINS/IRON PO) Take 1 tablet by mouth daily.     [provider]  pantoprazole (PROTONIX) 40 MG tablet TAKE 1 TABLET BY MOUTH EVERY DAY 08/05/20   Nicolette Bang, DO  sitaGLIPtin (JANUVIA) 100 MG tablet Take 100 mg by mouth daily.    [provider]  tiZANidine (ZANAFLEX) 4 MG tablet Take 1 tablet (4 mg total) by mouth at bedtime. 06/03/20   Mayers, Cari S, PA-C   valsartan (DIOVAN) 160 MG tablet TAKE 1 TABLET BY MOUTH EVERY DAY 09/10/20   Nahser, Wonda Cheng, MD       Physical Exam: BP 127/73   Pulse (!) 106   Temp 97.6 F (36.4 C) (Oral)   Resp 16   SpO2 100%   General appearance: Well-developed, adult female, alert and in no acute distress .  Eyes: Anicteric, conjunctiva pink, lids and lashes normal. PERRL.    ENT: No nasal deformity, discharge, epistaxis.  Hearing intact. OP moist without lesions.   Neck: No neck masses.  Trachea midline.  No thyromegaly/tenderness. Lymph: No cervical or supraclavicular lymphadenopathy. Skin: Warm and dry.  No jaundice.  No suspicious rashes or lesions. Cardiac: RRR, nl S1-S2, no murmurs appreciated.  No LE edema.  Radial and pedal pulses 2+ and symmetric. Respiratory: Normal respiratory rate and rhythm.  CTAB without rales or wheezes. Abdomen: Abdomen soft.  No tenderness with palpation. No ascites, distension, hepatosplenomegaly.   MSK: No deformities or effusions of the large joints of the upper or lower extremities bilaterally.    Only able to lift bilateral lower extremities about 4 inches of the bed Neuro: Cranial nerves 2 through 12 grossly intact.  Sensation intact to light touch. Speech is fluent.  Marland Kitchen    Psych: Sensorium intact and responding to questions, attention normal.  Behavior appropriate.  Judgment and insight appear normal.    Labs on Admission:  I have personally reviewed following labs and imaging studies: CBC: Recent Labs  Lab 11/24/20 2052  WBC 6.9  NEUTROABS 4.3  HGB 11.1*  HCT 36.5  MCV 82.0  PLT 191   Basic Metabolic Panel: Recent Labs  Lab 11/24/20 2052 11/25/20 0100  NA 134*  --   K 4.5  --   CL 97*  --   CO2 26  --   GLUCOSE 360*  --   BUN 21*  --   CREATININE 0.70  --   CALCIUM 9.5  --   MG  --  2.0  PHOS  --  4.4   GFR: CrCl cannot be calculated (Unknown ideal weight.).  Liver Function Tests: Recent Labs  Lab 11/24/20 2052  AST 114*  ALT 232*   ALKPHOS 67  BILITOT 0.6  PROT 7.4  ALBUMIN 3.8   No results for input(s): LIPASE, AMYLASE in the last 168 hours. No results for input(s): AMMONIA in the last 168 hours. Coagulation Profile: No results for input(s): INR, PROTIME in the last 168 hours. Cardiac Enzymes: Recent Labs  Lab 11/24/20 2052  CKTOTAL 7,517*   BNP (last 3 results) No results for input(s): PROBNP in the last 8760 hours. HbA1C: No results for input(s): HGBA1C in the last 72 hours. CBG: No results for input(s): GLUCAP in the last 168 hours. Lipid Profile: No results for input(s): CHOL, HDL, LDLCALC, TRIG, CHOLHDL, LDLDIRECT in the last 72 hours. Thyroid Function Tests: No results for input(s): TSH, T4TOTAL, FREET4, T3FREE, THYROIDAB in the last 72 hours. Anemia Panel: No results for input(s): VITAMINB12, FOLATE, FERRITIN, TIBC, IRON, RETICCTPCT in the last 72 hours.   Recent Results (from the past 240 hour(s))  Resp Panel by RT-PCR (Flu A&B, Covid) Nasopharyngeal Swab     Status: None   Collection Time: 11/24/20 11:30 PM   Specimen: Nasopharyngeal Swab; Nasopharyngeal(NP) swabs in vial transport medium  Result Value Ref Range Status   SARS Coronavirus 2 by RT PCR NEGATIVE NEGATIVE Final    Comment: (NOTE) SARS-CoV-2 target nucleic acids are NOT DETECTED.  The SARS-CoV-2 RNA is generally detectable in upper respiratory specimens during the acute phase of infection. The lowest concentration of SARS-CoV-2 viral copies this assay can detect is 138 copies/mL. A negative result does not preclude SARS-Cov-2 infection and should not be used as the sole basis for treatment or other patient management decisions. A negative result may occur with  improper specimen collection/handling, submission of specimen other than nasopharyngeal swab, presence of viral mutation(s) within the areas targeted by this assay, and inadequate number of viral copies(<138 copies/mL). A negative result must be combined  with clinical observations, patient history, and epidemiological information. The expected result is Negative.  Fact Sheet for Patients:  EntrepreneurPulse.com.au  Fact Sheet for Healthcare Providers:  IncredibleEmployment.be  This test is no t yet approved or cleared by the [place] FDA and  has been authorized for detection and/or  diagnosis of SARS-CoV-2 by FDA under an Emergency Use Authorization (EUA). This EUA will remain  in effect (meaning this test can be used) for the duration of the COVID-19 declaration under Section 564(b)(1) of the Act, 21 U.S.C.section 360bbb-3(b)(1), unless the authorization is terminated  or revoked sooner.       Influenza A by PCR NEGATIVE NEGATIVE Final   Influenza B by PCR NEGATIVE NEGATIVE Final    Comment: (NOTE) The Xpert Xpress SARS-CoV-2/FLU/RSV plus assay is intended as an aid in the diagnosis of influenza from Nasopharyngeal swab specimens and should not be used as a sole basis for treatment. Nasal washings and aspirates are unacceptable for Xpert Xpress SARS-CoV-2/FLU/RSV testing.  Fact Sheet for Patients: EntrepreneurPulse.com.au  Fact Sheet for Healthcare Providers: IncredibleEmployment.be  This test is not yet approved or cleared by the [place] FDA and has been authorized for detection and/or diagnosis of SARS-CoV-2 by FDA under an Emergency Use Authorization (EUA). This EUA will remain in effect (meaning this test can be used) for the duration of the COVID-19 declaration under Section 564(b)(1) of the Act, 21 U.S.C. section 360bbb-3(b)(1), unless the authorization is terminated or revoked.  Performed at [facility], [place] [address], [place], [place] [address]            Radiological Exams on Admission: Personally reviewed imaging which shows: No results found.       Assessment/Plan   1.  Bilateral lower  extremity weakness -Likely progression of her myopathy -Previously followed outpatient with neurology and diagnosed with irritable myopathy affecting limb-girdle distribution.  However has refused muscle biopsy in the past, but seems more willing now -Neurology contacted in the ED, will evaluate in the morning.  Recommended MRI of thoracic and lumbar spine -MRI of the lumbar and thoracic spine ordered -Fall precautions -PT and OT consulted  2. Irritable myopathy affecting limb-girdle distribution  -See further plans above  3.  Poorly controlled insulin-dependent type 2 diabetes -Hemoglobin A1c 11.5 in October 2021.  Repeat hemoglobin A1c ordered -At home was on Lantus 10 units daily and Metformin and sulfonylurea -Hold home Metformin and sulfonylurea -Increase Lantus to 20 units daily, but likely will need to increase further -Glucose checks, sliding scale -ADA diet  4.  Essential hypertension -Resume home medications once reconciled  5.  Elevated CK -On admission CK 7500. -Appears to be chronically elevated, possibly related to underlying myositis??  6.  Neuropathy -Continue home gabapentin  7.  Elevated liver enzymes -On admission ALT 228, AST 118 -Appears to be chronically elevated, possibly related to underlying myositis??   DVT prophylaxis: Lovenox Code Status: Full Family Communication: Patient herself Disposition Plan: Rehab versus home Consults called: ER contacted neurology, will see in the morning Admission status: Inpatient    Medical decision making: Patient seen at 11:45 PM on 11/24/2020.  The patient was discussed with ER provider.  What exists of the patient's chart was reviewed in depth and summarized above.  Clinical condition: Fair.        Doran Heater [facility] Please page though [place] or Epic secure chat:  For password, contact charge nurse

## 2020-11-25 NOTE — Progress Notes (Signed)
[facility] PROGRESS NOTE   Kristine Bauer NID:[id number] DOB: 01/07/1966 DOA: 11/24/2020  PCP: Nicolette Bang, DO  Brief History/Interval Summary: 55 y.o. female, with PMH of myositis, insulin-dependent type 2 diabetes, hypertension, diastolic CHF who presented to the ER on 11/24/2020 with complaint of unable to get off the toilet due to bilateral lower extremity weakness.  Patient apparently has had problems with lower extremity weakness for 2 years.  She has been seen by outpatient neurology.  A muscle biopsy was offered to the patient in the past which she has declined for various reasons.  Presented again with similar complaints.  Was hospitalized for further management.  Neurology was consulted.  Consultants: Neurology  Procedures: None yet  Antibiotics: Anti-infectives (From admission, onward)   None      Subjective/Interval History: Patient with numerous complaints today including weakness in her legs, burning pain in the foot, anxiety, chest pain.  Asking for more pain medications.  Asking for higher dose of gabapentin.     Assessment/Plan:  Bilateral lower extremity weakness with a history of myositis Patient CK level noted to be elevated which apparently has been elevated previously as well.  Neurology consulted.  MRI of the thoracic and lumbar spine has been ordered.  Results do not show any acute or significantly concerning findings.  Wait for neurology input. PT and OT evaluation.  May need to go to rehab. She is on gabapentin for neuropathic pain.  Chest pain Patient experiencing discomfort across her chest.  She is followed by Dr. Katharina Caper with cardiology and apparently has an appointment with them later today.  EKG was done which does not show any ischemic changes.  Her tachycardia is most likely due to her anxiety.  We will check troponin levels.  Irritable myopathy affecting limb-girdle distribution See discussion above.  Followed by  neurology for same.  Has undergone EMG in the outpatient setting.  CK elevated as noted earlier.  PT and OT evaluation.  Insulin-dependent diabetes mellitus type 2, poorly controlled HbA1c 12.5 implying very poor control of diabetes.  At home she is on Lantus Metformin and sulfonylurea.  Continue Lantus for now.  Continue SSI.  Will increase the dose of her Lantus today.  Essential hypertension Monitor blood pressures.  Noted to be on amlodipine, hydrochlorothiazide and Diovan at home.  These medications can be resumed.  Transaminitis, chronic Probably related to underlying health problems including the myositis.  Normocytic anemia No evidence of overt bleeding.  Continue to monitor.  Check anemia panel if not checked recently.  Obesity Estimated body mass index is 40.92 kg/m as calculated from the following:   Height as of 06/10/20: 5\' 3"  (1.6 m).   Weight as of 06/10/20: 104.8 kg.   DVT Prophylaxis: Lovenox Code Status: Full code Family Communication: Discussed with the patient Disposition Plan: May need to go to rehab.  Status is: Inpatient  Remains inpatient appropriate because:Unsafe d/c plan and Inpatient level of care appropriate due to severity of illness   Dispo: The patient is from: Home              Anticipated d/c is to: To be determined              Patient currently is not medically stable to d/c.   Difficult to place patient No     Medications:  Scheduled: . enoxaparin (LOVENOX) injection  40 mg Subcutaneous Q24H  . gabapentin  300 mg Oral TID  . insulin aspart  0-15  Units Subcutaneous TID WC  . insulin aspart  0-5 Units Subcutaneous QHS  . insulin glargine  20 Units Subcutaneous QHS   Continuous:  IWL:NLGXQJJHERDEY **OR** acetaminophen, ALPRAZolam, ondansetron **OR** ondansetron (ZOFRAN) IV, polyethylene glycol, sodium chloride   Objective:  Vital Signs  Vitals:   11/24/20 2245 11/25/20 0106 11/25/20 0434 11/25/20 0935  BP: 127/73 138/76 (!)  145/74 (!) 152/75  Pulse: (!) 106  (!) 103 (!) 112  Resp: 16 20 18 17   Temp:   98.3 F (36.8 C) 97.7 F (36.5 C)  TempSrc:   Oral Oral  SpO2: 100% 97% 100% 100%    Intake/Output Summary (Last 24 hours) at 11/25/2020 1051 Last data filed at 11/25/2020 0641 Gross per 24 hour  Intake 1360 ml  Output 1 ml  Net 1359 ml   Filed Weights    General appearance: Awake alert.  In no distress.  Very anxious Resp: Clear to auscultation bilaterally.  Normal effort Cardio: S1-S2 is normal regular.  No S3-S4.  No rubs murmurs or bruit GI: Abdomen is soft.  Nontender nondistended.  Bowel sounds are present normal.  No masses organomegaly Extremities: No edema.  Weakness noted of the lower extremities Neurologic: Alert and oriented x3.  No cranial nerve deficits.  Moving her arms normally.  Weakness of bilateral lower extremities noted.    Lab Results:  Data Reviewed: I have personally reviewed following labs and imaging studies  CBC: Recent Labs  Lab 11/24/20 2052 11/25/20 0653  WBC 6.9 7.6  NEUTROABS 4.3  --   HGB 11.1* 10.4*  HCT 36.5 33.9*  MCV 82.0 81.3  PLT 327 814    Basic Metabolic Panel: Recent Labs  Lab 11/24/20 2052 11/25/20 0100 11/25/20 0653  NA 134*  --  135  K 4.5  --  3.6  CL 97*  --  101  CO2 26  --  25  GLUCOSE 360*  --  301*  BUN 21*  --  15  CREATININE 0.70  --  0.53  CALCIUM 9.5  --  8.7*  MG  --  2.0  --   PHOS  --  4.4  --     GFR: CrCl cannot be calculated (Unknown ideal weight.).  Liver Function Tests: Recent Labs  Lab 11/24/20 2052 11/25/20 0653  AST 114* 91*  ALT 232* 184*  ALKPHOS 67 58  BILITOT 0.6 0.5  PROT 7.4 6.5  ALBUMIN 3.8 3.3*     Cardiac Enzymes: Recent Labs  Lab 11/24/20 2052  CKTOTAL 7,517*     HbA1C: Recent Labs    11/25/20 0653  HGBA1C 12.5*    CBG: Recent Labs  Lab 11/25/20 0038 11/25/20 0828  GLUCAP 255* 269*     Recent Results (from the past 240 hour(s))  Resp Panel by RT-PCR (Flu A&B,  Covid) Nasopharyngeal Swab     Status: None   Collection Time: 11/24/20 11:30 PM   Specimen: Nasopharyngeal Swab; Nasopharyngeal(NP) swabs in vial transport medium  Result Value Ref Range Status   SARS Coronavirus 2 by RT PCR NEGATIVE NEGATIVE Final    Comment: (NOTE) SARS-CoV-2 target nucleic acids are NOT DETECTED.  The SARS-CoV-2 RNA is generally detectable in upper respiratory specimens during the acute phase of infection. The lowest concentration of SARS-CoV-2 viral copies this assay can detect is 138 copies/mL. A negative result does not preclude SARS-Cov-2 infection and should not be used as the sole basis for treatment or other patient management decisions. A negative result may occur with  improper specimen collection/handling, submission of specimen other than nasopharyngeal swab, presence of viral mutation(s) within the areas targeted by this assay, and inadequate number of viral copies(<138 copies/mL). A negative result must be combined with clinical observations, patient history, and epidemiological information. The expected result is Negative.  Fact Sheet for Patients:  EntrepreneurPulse.com.au  Fact Sheet for Healthcare Providers:  IncredibleEmployment.be  This test is no t yet approved or cleared by the [place] FDA and  has been authorized for detection and/or diagnosis of SARS-CoV-2 by FDA under an Emergency Use Authorization (EUA). This EUA will remain  in effect (meaning this test can be used) for the duration of the COVID-19 declaration under Section 564(b)(1) of the Act, 21 U.S.C.section 360bbb-3(b)(1), unless the authorization is terminated  or revoked sooner.       Influenza A by PCR NEGATIVE NEGATIVE Final   Influenza B by PCR NEGATIVE NEGATIVE Final    Comment: (NOTE) The Xpert Xpress SARS-CoV-2/FLU/RSV plus assay is intended as an aid in the diagnosis of influenza from Nasopharyngeal swab specimens and should  not be used as a sole basis for treatment. Nasal washings and aspirates are unacceptable for Xpert Xpress SARS-CoV-2/FLU/RSV testing.  Fact Sheet for Patients: EntrepreneurPulse.com.au  Fact Sheet for Healthcare Providers: IncredibleEmployment.be  This test is not yet approved or cleared by the [place] FDA and has been authorized for detection and/or diagnosis of SARS-CoV-2 by FDA under an Emergency Use Authorization (EUA). This EUA will remain in effect (meaning this test can be used) for the duration of the COVID-19 declaration under Section 564(b)(1) of the Act, 21 U.S.C. section 360bbb-3(b)(1), unless the authorization is terminated or revoked.  Performed at [facility], [place] [address] [place], [place] [address]       Radiology Studies: MR THORACIC SPINE WO CONTRAST  Result Date: 11/25/2020 CLINICAL DATA:  Progressive back pain and neurologic deficit. History of neuropathy and myopathy. Bilateral lower extremity weakness. Unable to ambulate. EXAM: MRI THORACIC AND LUMBAR SPINE WITHOUT CONTRAST TECHNIQUE: Multiplanar and multiecho pulse sequences of the thoracic and lumbar spine were obtained without intravenous contrast. COMPARISON:  None. FINDINGS: MRI THORACIC SPINE FINDINGS Alignment:  Normal Vertebrae: Normal marrow signal.  No bone lesions or fractures. Cord:  Normal cord signal intensity.  No cord lesions or syrinx. Paraspinal and other soft tissues: No significant paraspinal findings. No obvious mediastinal mass or lung lesions. No pleural effusions. Disc levels: Mild multilevel degenerative disc disease with disc space narrowing and osteophytic spurring. No large disc protrusions or significant spinal stenosis. Multilevel shallow disc osteophyte complexes are noted without significant mass effect on the thecal sac or spinal cord. Shallow right-sided disc osteophyte complex at T2-3 with mild impression on the right  side of the thecal sac. Degenerative disc disease at T7-8 with shallow bilateral disc osteophyte complexes and mild impression on both sides of the thecal sac. Right-sided disc osteophyte complex at T8-9. Bilateral disc osteophyte complexes at T9-10. Small central disc protrusion at T11-12. MRI LUMBAR SPINE FINDINGS Segmentation: There are five lumbar type vertebral bodies. The last full intervertebral disc space is labeled L5-S1. This correlates with the lumbar spine numbering. Alignment:  Normal Vertebrae:  Normal marrow signal.  No bone lesions or fractures. Conus medullaris and cauda equina: Conus extends to the T12-L1 level. Conus and cauda equina appear normal. Paraspinal and other soft tissues: No significant paraspinal or retroperitoneal findings. Disc levels: The spinal canal is quite generous. No significant spinal or foraminal stenosis. L1-2: No significant findings.  L2-3: Mild annular bulge with slight impression on both sides of the thecal sac and mild lateral recess encroachment. No significant spinal or foraminal stenosis. L3-4: Mild bulging annulus and mild facet disease with mild bilateral lateral recess encroachment. No significant spinal or foraminal stenosis. L4-5: Moderate facet disease but no disc protrusions, spinal or foraminal stenosis. L5-S1: Shallow broad-based right paracentral and medial foraminal disc protrusion. There is slight flattening of the ventral thecal sac and potential irritation of the right S1 nerve root. The exiting L5 nerve roots appear normal. IMPRESSION: 1. Mild multilevel degenerative thoracic spondylosis with disc space narrowing and osteophytic spurring. Multilevel shallow disc osteophyte complexes bilaterally with mild impression on the thecal sac. No significant spinal stenosis. 2. Small central disc protrusion at T11-12. 3. Shallow broad-based right paracentral and medial foraminal disc protrusion at L5-S1. No significant neural compression but potential irritation  of the right S1 nerve root. 4. Mild bilateral lateral recess encroachment at L2-3 and L3-4. 5. Unremarkable MR appearance of the thoracic spinal cord. No cord lesions or syrinx is identified. Electronically Signed   By: Marijo Sanes M.D.   On: 11/25/2020 08:57   MR LUMBAR SPINE WO CONTRAST  Result Date: 11/25/2020 CLINICAL DATA:  Progressive back pain and neurologic deficit. History of neuropathy and myopathy. Bilateral lower extremity weakness. Unable to ambulate. EXAM: MRI THORACIC AND LUMBAR SPINE WITHOUT CONTRAST TECHNIQUE: Multiplanar and multiecho pulse sequences of the thoracic and lumbar spine were obtained without intravenous contrast. COMPARISON:  None. FINDINGS: MRI THORACIC SPINE FINDINGS Alignment:  Normal Vertebrae: Normal marrow signal.  No bone lesions or fractures. Cord:  Normal cord signal intensity.  No cord lesions or syrinx. Paraspinal and other soft tissues: No significant paraspinal findings. No obvious mediastinal mass or lung lesions. No pleural effusions. Disc levels: Mild multilevel degenerative disc disease with disc space narrowing and osteophytic spurring. No large disc protrusions or significant spinal stenosis. Multilevel shallow disc osteophyte complexes are noted without significant mass effect on the thecal sac or spinal cord. Shallow right-sided disc osteophyte complex at T2-3 with mild impression on the right side of the thecal sac. Degenerative disc disease at T7-8 with shallow bilateral disc osteophyte complexes and mild impression on both sides of the thecal sac. Right-sided disc osteophyte complex at T8-9. Bilateral disc osteophyte complexes at T9-10. Small central disc protrusion at T11-12. MRI LUMBAR SPINE FINDINGS Segmentation: There are five lumbar type vertebral bodies. The last full intervertebral disc space is labeled L5-S1. This correlates with the lumbar spine numbering. Alignment:  Normal Vertebrae:  Normal marrow signal.  No bone lesions or fractures. Conus  medullaris and cauda equina: Conus extends to the T12-L1 level. Conus and cauda equina appear normal. Paraspinal and other soft tissues: No significant paraspinal or retroperitoneal findings. Disc levels: The spinal canal is quite generous. No significant spinal or foraminal stenosis. L1-2: No significant findings. L2-3: Mild annular bulge with slight impression on both sides of the thecal sac and mild lateral recess encroachment. No significant spinal or foraminal stenosis. L3-4: Mild bulging annulus and mild facet disease with mild bilateral lateral recess encroachment. No significant spinal or foraminal stenosis. L4-5: Moderate facet disease but no disc protrusions, spinal or foraminal stenosis. L5-S1: Shallow broad-based right paracentral and medial foraminal disc protrusion. There is slight flattening of the ventral thecal sac and potential irritation of the right S1 nerve root. The exiting L5 nerve roots appear normal. IMPRESSION: 1. Mild multilevel degenerative thoracic spondylosis with disc space narrowing and osteophytic spurring. Multilevel shallow disc  osteophyte complexes bilaterally with mild impression on the thecal sac. No significant spinal stenosis. 2. Small central disc protrusion at T11-12. 3. Shallow broad-based right paracentral and medial foraminal disc protrusion at L5-S1. No significant neural compression but potential irritation of the right S1 nerve root. 4. Mild bilateral lateral recess encroachment at L2-3 and L3-4. 5. Unremarkable MR appearance of the thoracic spinal cord. No cord lesions or syrinx is identified. Electronically Signed   By: Marijo Sanes M.D.   On: 11/25/2020 08:57       LOS: 0 days   [facility] Pager on [email or web address]  11/25/2020, 10:51 AM

## 2020-11-25 NOTE — ED Notes (Signed)
Pt resting comfortably, continuous vs monitoring in place, call light in reach, side rails upx2.  Water and graham crackers provided to pt.  Pt educated on diabetic diet, pt verbalized understanding.

## 2020-11-26 ENCOUNTER — Inpatient Hospital Stay (HOSPITAL_COMMUNITY): Payer: 59

## 2020-11-26 DIAGNOSIS — E1169 Type 2 diabetes mellitus with other specified complication: Secondary | ICD-10-CM | POA: Diagnosis not present

## 2020-11-26 DIAGNOSIS — M609 Myositis, unspecified: Secondary | ICD-10-CM | POA: Diagnosis not present

## 2020-11-26 DIAGNOSIS — I10 Essential (primary) hypertension: Secondary | ICD-10-CM | POA: Diagnosis not present

## 2020-11-26 DIAGNOSIS — Z794 Long term (current) use of insulin: Secondary | ICD-10-CM | POA: Diagnosis not present

## 2020-11-26 LAB — COMPREHENSIVE METABOLIC PANEL
ALT: 167 U/L — ABNORMAL HIGH (ref 0–44)
AST: 84 U/L — ABNORMAL HIGH (ref 15–41)
Albumin: 3.2 g/dL — ABNORMAL LOW (ref 3.5–5.0)
Alkaline Phosphatase: 53 U/L (ref 38–126)
Anion gap: 7 (ref 5–15)
BUN: 12 mg/dL (ref 6–20)
CO2: 24 mmol/L (ref 22–32)
Calcium: 8.9 mg/dL (ref 8.9–10.3)
Chloride: 105 mmol/L (ref 98–111)
Creatinine, Ser: 0.39 mg/dL — ABNORMAL LOW (ref 0.44–1.00)
GFR, Estimated: >60 mL/min (ref >60)
Glucose, Bld: 196 mg/dL — ABNORMAL HIGH (ref 70–99)
Potassium: 4.2 mmol/L (ref 3.5–5.1)
Sodium: 136 mmol/L (ref 135–145)
Total Bilirubin: 0.6 mg/dL (ref 0.3–1.2)
Total Protein: 6.3 g/dL — ABNORMAL LOW (ref 6.5–8.1)

## 2020-11-26 LAB — CBC
HCT: 35.1 % — ABNORMAL LOW (ref 36.0–46.0)
Hemoglobin: 10.5 g/dL — ABNORMAL LOW (ref 12.0–15.0)
MCH: 24.9 pg — ABNORMAL LOW (ref 26.0–34.0)
MCHC: 29.9 g/dL — ABNORMAL LOW (ref 30.0–36.0)
MCV: 83.2 fL (ref 80.0–100.0)
Platelets: 292 10*3/uL (ref 150–400)
RBC: 4.22 MIL/uL (ref 3.87–5.11)
RDW: 14.7 % (ref 11.5–15.5)
WBC: 7.2 10*3/uL (ref 4.0–10.5)
nRBC: 0 % (ref 0.0–0.2)

## 2020-11-26 LAB — HEPATITIS PANEL, ACUTE
HCV Ab: NONREACTIVE
Hep A IgM: NONREACTIVE
Hep B C IgM: NONREACTIVE
Hepatitis B Surface Ag: NONREACTIVE

## 2020-11-26 LAB — IRON AND TIBC
Iron: 42 ug/dL (ref 28–170)
Saturation Ratios: 11 % (ref 10.4–31.8)
TIBC: 376 ug/dL (ref 250–450)
UIBC: 334 ug/dL

## 2020-11-26 LAB — RETICULOCYTES
Immature Retic Fract: 15.2 % (ref 2.3–15.9)
RBC.: 4.21 MIL/uL (ref 3.87–5.11)
Retic Count, Absolute: 53 10*3/uL (ref 19.0–186.0)
Retic Ct Pct: 1.3 % (ref 0.4–3.1)

## 2020-11-26 LAB — FERRITIN: Ferritin: 21 ng/mL (ref 11–307)

## 2020-11-26 LAB — GLUCOSE, CAPILLARY
Glucose-Capillary: 205 mg/dL — ABNORMAL HIGH (ref 70–99)
Glucose-Capillary: 224 mg/dL — ABNORMAL HIGH (ref 70–99)
Glucose-Capillary: 341 mg/dL — ABNORMAL HIGH (ref 70–99)
Glucose-Capillary: 349 mg/dL — ABNORMAL HIGH (ref 70–99)

## 2020-11-26 LAB — FOLATE: Folate: 20.9 ng/mL (ref >5.9)

## 2020-11-26 LAB — VITAMIN B12: Vitamin B-12: 987 pg/mL — ABNORMAL HIGH (ref 180–914)

## 2020-11-26 MED ORDER — GUAIFENESIN 100 MG/5ML PO SOLN
5.0000 mL | ORAL | Status: DC | PRN
  Administered 2020-11-28 – 2020-12-04 (×8): 100 mg via ORAL
  Filled 2020-11-26 (×8): qty 10

## 2020-11-26 MED ORDER — ALBUTEROL SULFATE HFA 108 (90 BASE) MCG/ACT IN AERS
2.0000 | INHALATION_SPRAY | Freq: Four times a day (QID) | RESPIRATORY_TRACT | Status: DC | PRN
  Filled 2020-11-26 (×2): qty 6.7

## 2020-11-26 NOTE — Progress Notes (Signed)
[facility] PROGRESS NOTE   Kristine Bauer MHD:[id number] DOB: 10-06-1965 DOA: 11/24/2020  PCP: Nicolette Bang, DO  Brief History/Interval Summary: 55 y.o. female, with PMH of myositis, insulin-dependent type 2 diabetes, hypertension, diastolic CHF who presented to the ER on 11/24/2020 with complaint of unable to get off the toilet due to bilateral lower extremity weakness.  Patient apparently has had problems with lower extremity weakness for 2 years.  She has been seen by outpatient neurology.  A muscle biopsy was offered to the patient in the past which she has declined for various reasons.  Presented again with similar complaints.  Was hospitalized for further management.  Neurology was consulted.  Consultants: Neurology  Procedures: None yet  Antibiotics: Anti-infectives (From admission, onward)   None      Subjective/Interval History: Patient denies any further chest pain this morning.  Complains of cough for which she is requesting cough suppressant.  Also complains of feeling "gassy".  Denies any nausea or vomiting.  Muscle biopsy was discussed with her as recommended by neurology.  She is hesitant but agreeable to same.     Assessment/Plan:  Bilateral lower extremity weakness with a history of myositis Patient CK level noted to be elevated which apparently has been elevated previously as well.  Neurology consulted.  MRI of the thoracic and lumbar spine was ordered.  Findings reviewed.  No significant findings noted which can explain her bilateral weakness.  Weakness is thought to be due to her longstanding issue with myopathy.  Discussed with neurology and muscle biopsy is recommended.  Discussed with general surgery who talked to patient about this.   She is on gabapentin for neuropathic pain. Seen by PT and OT and rehab was recommended.  Patient is agreeable.  Chest pain Patient has atypical symptoms.  EKG done yesterday did not show any acute  findings.  Troponins were normal.  Symptoms have resolved.  Could be due to acid reflux.  Continue PPI.  Change to twice daily.  Sinus tachycardia most likely due to anxiety.  Longstanding issue based on previous records.  Irritable myopathy affecting limb-girdle distribution See discussion above.  Followed by neurology for same.  Has undergone EMG in the outpatient setting.  CK elevated as noted earlier.  PT and OT evaluation.  Insulin-dependent diabetes mellitus type 2, poorly controlled HbA1c 12.5 implying very poor control of diabetes.  At home she is on Lantus Metformin and sulfonylurea.  Lantus dose was increased yesterday.  Continue SSI.  Essential hypertension Monitor blood pressures.  Noted to be on amlodipine, hydrochlorothiazide and Diovan at home.  These medications were resumed.  Transaminitis, chronic Probably related to underlying health problems including the myositis.  Hepatitis panel unremarkable.  LFTs are stable.  Normocytic anemia No evidence of overt bleeding.  Hemoglobin stable.  Anemia panel without any clear-cut deficiencies.  Obesity Estimated body mass index is 40.92 kg/m as calculated from the following:   Height as of 06/10/20: 5\' 3"  (1.6 m).   Weight as of 06/10/20: 104.8 kg.   DVT Prophylaxis: Lovenox Code Status: Full code Family Communication: Discussed with the patient Disposition Plan: May need to go to rehab.  Status is: Inpatient  Remains inpatient appropriate because:Unsafe d/c plan and Inpatient level of care appropriate due to severity of illness   Dispo: The patient is from: Home              Anticipated d/c is to: To be determined  Patient currently is not medically stable to d/c.   Difficult to place patient No     Medications:  Scheduled: . amLODipine  5 mg Oral Daily  . enoxaparin (LOVENOX) injection  40 mg Subcutaneous Q24H  . gabapentin  600 mg Oral BID  . insulin aspart  0-15 Units Subcutaneous TID WC  . insulin  aspart  0-5 Units Subcutaneous QHS  . insulin glargine  28 Units Subcutaneous QHS  . irbesartan  150 mg Oral Daily  . montelukast  10 mg Oral QHS  . oxymetazoline  1 spray Each Nare BID  . pantoprazole  40 mg Oral Daily   Continuous:  LZJ:QBHALPFXTKWIO **OR** acetaminophen, albuterol, ALPRAZolam, guaiFENesin, ondansetron **OR** ondansetron (ZOFRAN) IV, polyethylene glycol, sodium chloride   Objective:  Vital Signs  Vitals:   11/25/20 2101 11/26/20 0113 11/26/20 0531 11/26/20 0918  BP: 132/74 139/70 135/75 (!) 147/65  Pulse: (!) 109 (!) 106 (!) 104 (!) 107  Resp: 16 18 18 16   Temp: 98.5 F (36.9 C) 97.8 F (36.6 C) 98.2 F (36.8 C) 98.5 F (36.9 C)  TempSrc: Oral Oral Oral Oral  SpO2: 99% 100% 100% 99%    Intake/Output Summary (Last 24 hours) at 11/26/2020 1135 Last data filed at 11/26/2020 1000 Gross per 24 hour  Intake 960 ml  Output 1 ml  Net 959 ml   Filed Weights    General appearance: Awake alert.  In no distress.  Anxious Resp: Clear to auscultation bilaterally.  Normal effort Cardio: S1-S2 is normal regular.  No S3-S4.  No rubs murmurs or bruit GI: Abdomen is soft.  Nontender nondistended.  Bowel sounds are present normal.  No masses organomegaly Extremities: No edema.  Improve mobility of the lower extremities noted today. Neurologic: Alert and oriented x3.  No focal neurological deficits.     Lab Results:  Data Reviewed: I have personally reviewed following labs and imaging studies  CBC: Recent Labs  Lab 11/24/20 2052 11/25/20 0653 11/26/20 0506  WBC 6.9 7.6 7.2  NEUTROABS 4.3  --   --   HGB 11.1* 10.4* 10.5*  HCT 36.5 33.9* 35.1*  MCV 82.0 81.3 83.2  PLT 327 317 973    Basic Metabolic Panel: Recent Labs  Lab 11/24/20 2052 11/25/20 0100 11/25/20 0653 11/26/20 0506  NA 134*  --  135 136  K 4.5  --  3.6 4.2  CL 97*  --  101 105  CO2 26  --  25 24  GLUCOSE 360*  --  301* 196*  BUN 21*  --  15 12  CREATININE 0.70  --  0.53 0.39*   CALCIUM 9.5  --  8.7* 8.9  MG  --  2.0  --   --   PHOS  --  4.4  --   --     GFR: CrCl cannot be calculated (Unknown ideal weight.).  Liver Function Tests: Recent Labs  Lab 11/24/20 2052 11/25/20 0653 11/26/20 0506  AST 114* 91* 84*  ALT 232* 184* 167*  ALKPHOS 67 58 53  BILITOT 0.6 0.5 0.6  PROT 7.4 6.5 6.3*  ALBUMIN 3.8 3.3* 3.2*     Cardiac Enzymes: Recent Labs  Lab 11/24/20 2052  CKTOTAL 7,517*     HbA1C: Recent Labs    11/25/20 0653  HGBA1C 12.5*    CBG: Recent Labs  Lab 11/25/20 0828 11/25/20 1114 11/25/20 1627 11/25/20 2101 11/26/20 0741  GLUCAP 269* 234* 286* 389* 205*     Recent Results (from the past  240 hour(s))  Resp Panel by RT-PCR (Flu A&B, Covid) Nasopharyngeal Swab     Status: None   Collection Time: 11/24/20 11:30 PM   Specimen: Nasopharyngeal Swab; Nasopharyngeal(NP) swabs in vial transport medium  Result Value Ref Range Status   SARS Coronavirus 2 by RT PCR NEGATIVE NEGATIVE Final    Comment: (NOTE) SARS-CoV-2 target nucleic acids are NOT DETECTED.  The SARS-CoV-2 RNA is generally detectable in upper respiratory specimens during the acute phase of infection. The lowest concentration of SARS-CoV-2 viral copies this assay can detect is 138 copies/mL. A negative result does not preclude SARS-Cov-2 infection and should not be used as the sole basis for treatment or other patient management decisions. A negative result may occur with  improper specimen collection/handling, submission of specimen other than nasopharyngeal swab, presence of viral mutation(s) within the areas targeted by this assay, and inadequate number of viral copies(<138 copies/mL). A negative result must be combined with clinical observations, patient history, and epidemiological information. The expected result is Negative.  Fact Sheet for Patients:  EntrepreneurPulse.com.au  Fact Sheet for Healthcare Providers:   IncredibleEmployment.be  This test is no t yet approved or cleared by the [place] FDA and  has been authorized for detection and/or diagnosis of SARS-CoV-2 by FDA under an Emergency Use Authorization (EUA). This EUA will remain  in effect (meaning this test can be used) for the duration of the COVID-19 declaration under Section 564(b)(1) of the Act, 21 U.S.C.section 360bbb-3(b)(1), unless the authorization is terminated  or revoked sooner.       Influenza A by PCR NEGATIVE NEGATIVE Final   Influenza B by PCR NEGATIVE NEGATIVE Final    Comment: (NOTE) The Xpert Xpress SARS-CoV-2/FLU/RSV plus assay is intended as an aid in the diagnosis of influenza from Nasopharyngeal swab specimens and should not be used as a sole basis for treatment. Nasal washings and aspirates are unacceptable for Xpert Xpress SARS-CoV-2/FLU/RSV testing.  Fact Sheet for Patients: EntrepreneurPulse.com.au  Fact Sheet for Healthcare Providers: IncredibleEmployment.be  This test is not yet approved or cleared by the [place] FDA and has been authorized for detection and/or diagnosis of SARS-CoV-2 by FDA under an Emergency Use Authorization (EUA). This EUA will remain in effect (meaning this test can be used) for the duration of the COVID-19 declaration under Section 564(b)(1) of the Act, 21 U.S.C. section 360bbb-3(b)(1), unless the authorization is terminated or revoked.  Performed at [facility], [place] [address] [place], [place] [address]       Radiology Studies: MR THORACIC SPINE WO CONTRAST  Result Date: 11/25/2020 CLINICAL DATA:  Progressive back pain and neurologic deficit. History of neuropathy and myopathy. Bilateral lower extremity weakness. Unable to ambulate. EXAM: MRI THORACIC AND LUMBAR SPINE WITHOUT CONTRAST TECHNIQUE: Multiplanar and multiecho pulse sequences of the thoracic and lumbar spine were obtained  without intravenous contrast. COMPARISON:  None. FINDINGS: MRI THORACIC SPINE FINDINGS Alignment:  Normal Vertebrae: Normal marrow signal.  No bone lesions or fractures. Cord:  Normal cord signal intensity.  No cord lesions or syrinx. Paraspinal and other soft tissues: No significant paraspinal findings. No obvious mediastinal mass or lung lesions. No pleural effusions. Disc levels: Mild multilevel degenerative disc disease with disc space narrowing and osteophytic spurring. No large disc protrusions or significant spinal stenosis. Multilevel shallow disc osteophyte complexes are noted without significant mass effect on the thecal sac or spinal cord. Shallow right-sided disc osteophyte complex at T2-3 with mild impression on the right side of the thecal sac.  Degenerative disc disease at T7-8 with shallow bilateral disc osteophyte complexes and mild impression on both sides of the thecal sac. Right-sided disc osteophyte complex at T8-9. Bilateral disc osteophyte complexes at T9-10. Small central disc protrusion at T11-12. MRI LUMBAR SPINE FINDINGS Segmentation: There are five lumbar type vertebral bodies. The last full intervertebral disc space is labeled L5-S1. This correlates with the lumbar spine numbering. Alignment:  Normal Vertebrae:  Normal marrow signal.  No bone lesions or fractures. Conus medullaris and cauda equina: Conus extends to the T12-L1 level. Conus and cauda equina appear normal. Paraspinal and other soft tissues: No significant paraspinal or retroperitoneal findings. Disc levels: The spinal canal is quite generous. No significant spinal or foraminal stenosis. L1-2: No significant findings. L2-3: Mild annular bulge with slight impression on both sides of the thecal sac and mild lateral recess encroachment. No significant spinal or foraminal stenosis. L3-4: Mild bulging annulus and mild facet disease with mild bilateral lateral recess encroachment. No significant spinal or foraminal stenosis. L4-5:  Moderate facet disease but no disc protrusions, spinal or foraminal stenosis. L5-S1: Shallow broad-based right paracentral and medial foraminal disc protrusion. There is slight flattening of the ventral thecal sac and potential irritation of the right S1 nerve root. The exiting L5 nerve roots appear normal. IMPRESSION: 1. Mild multilevel degenerative thoracic spondylosis with disc space narrowing and osteophytic spurring. Multilevel shallow disc osteophyte complexes bilaterally with mild impression on the thecal sac. No significant spinal stenosis. 2. Small central disc protrusion at T11-12. 3. Shallow broad-based right paracentral and medial foraminal disc protrusion at L5-S1. No significant neural compression but potential irritation of the right S1 nerve root. 4. Mild bilateral lateral recess encroachment at L2-3 and L3-4. 5. Unremarkable MR appearance of the thoracic spinal cord. No cord lesions or syrinx is identified. Electronically Signed   By: Marijo Sanes M.D.   On: 11/25/2020 08:57   MR LUMBAR SPINE WO CONTRAST  Result Date: 11/25/2020 CLINICAL DATA:  Progressive back pain and neurologic deficit. History of neuropathy and myopathy. Bilateral lower extremity weakness. Unable to ambulate. EXAM: MRI THORACIC AND LUMBAR SPINE WITHOUT CONTRAST TECHNIQUE: Multiplanar and multiecho pulse sequences of the thoracic and lumbar spine were obtained without intravenous contrast. COMPARISON:  None. FINDINGS: MRI THORACIC SPINE FINDINGS Alignment:  Normal Vertebrae: Normal marrow signal.  No bone lesions or fractures. Cord:  Normal cord signal intensity.  No cord lesions or syrinx. Paraspinal and other soft tissues: No significant paraspinal findings. No obvious mediastinal mass or lung lesions. No pleural effusions. Disc levels: Mild multilevel degenerative disc disease with disc space narrowing and osteophytic spurring. No large disc protrusions or significant spinal stenosis. Multilevel shallow disc osteophyte  complexes are noted without significant mass effect on the thecal sac or spinal cord. Shallow right-sided disc osteophyte complex at T2-3 with mild impression on the right side of the thecal sac. Degenerative disc disease at T7-8 with shallow bilateral disc osteophyte complexes and mild impression on both sides of the thecal sac. Right-sided disc osteophyte complex at T8-9. Bilateral disc osteophyte complexes at T9-10. Small central disc protrusion at T11-12. MRI LUMBAR SPINE FINDINGS Segmentation: There are five lumbar type vertebral bodies. The last full intervertebral disc space is labeled L5-S1. This correlates with the lumbar spine numbering. Alignment:  Normal Vertebrae:  Normal marrow signal.  No bone lesions or fractures. Conus medullaris and cauda equina: Conus extends to the T12-L1 level. Conus and cauda equina appear normal. Paraspinal and other soft tissues: No significant paraspinal or retroperitoneal findings.  Disc levels: The spinal canal is quite generous. No significant spinal or foraminal stenosis. L1-2: No significant findings. L2-3: Mild annular bulge with slight impression on both sides of the thecal sac and mild lateral recess encroachment. No significant spinal or foraminal stenosis. L3-4: Mild bulging annulus and mild facet disease with mild bilateral lateral recess encroachment. No significant spinal or foraminal stenosis. L4-5: Moderate facet disease but no disc protrusions, spinal or foraminal stenosis. L5-S1: Shallow broad-based right paracentral and medial foraminal disc protrusion. There is slight flattening of the ventral thecal sac and potential irritation of the right S1 nerve root. The exiting L5 nerve roots appear normal. IMPRESSION: 1. Mild multilevel degenerative thoracic spondylosis with disc space narrowing and osteophytic spurring. Multilevel shallow disc osteophyte complexes bilaterally with mild impression on the thecal sac. No significant spinal stenosis. 2. Small central  disc protrusion at T11-12. 3. Shallow broad-based right paracentral and medial foraminal disc protrusion at L5-S1. No significant neural compression but potential irritation of the right S1 nerve root. 4. Mild bilateral lateral recess encroachment at L2-3 and L3-4. 5. Unremarkable MR appearance of the thoracic spinal cord. No cord lesions or syrinx is identified. Electronically Signed   By: Marijo Sanes M.D.   On: 11/25/2020 08:57       LOS: 1 day   [facility] Pager on [email or web address]  11/26/2020, 11:35 AM

## 2020-11-26 NOTE — Evaluation (Signed)
Occupational Therapy Evaluation Patient Details Name: Kristine Bauer MRN: [id number] DOB: 1965/12/30 Today's Date: 11/26/2020    History of Present Illness 55 yo female admitted with LE myositis. Hx of irritable myopathy, myositis, DM, CHF, obesity, neuropathy, asthma, falls   Clinical Impression   PTA patient was living alone in a 2nd floor apartment with full-flight to front door. Patient reports use of SPC for functional mobility and increased difficulty with mobility and self-care tasks including bathing, dressing and hygiene management reporting inability to safely get in and out of tub/shower. Patient currently requiring Min guard for bed mobility, toilet tranfers, and short-distance functional mobility in hospital room. Patient also requiring Min A grossly for LB ADLs. Patient would benefit from continued acute OT services in prep for safe d/c to next level of care. Given decreased caregiver support and deficits listed below, recommendation for SNF rehab. Patient in agreement with POC. OT will continue to follow acutely.     Follow Up Recommendations  SNF    Equipment Recommendations  Other (comment) (Defer to next level of care.)    Recommendations for Other Services       Precautions / Restrictions Precautions Precautions: Fall Restrictions Weight Bearing Restrictions: No      Mobility Bed Mobility Overal bed mobility: Modified Independent             General bed mobility comments: Increased time/effort with HOB elevated. No external assist required.    Transfers Overall transfer level: Needs assistance Equipment used: Rolling walker (2 wheeled) Transfers: Sit to/from Stand Sit to Stand: Min guard         General transfer comment: Increased time/effort and cues for hand placement/safety. Patient resistant to use of RW but in agreement after education.    Balance Overall balance assessment: Needs assistance;History of Falls   Sitting balance-Leahy  Scale: Good     Standing balance support: Bilateral upper extremity supported;During functional activity Standing balance-Leahy Scale: Fair Standing balance comment: Reliant on BUE supported on sink surface during grooming standing at sink. Furniture walks when not using AD.                           ADL either performed or assessed with clinical judgement   ADL Overall ADL's : Needs assistance/impaired         Upper Body Bathing: Set up;Sitting Upper Body Bathing Details (indicate cue type and reason): Able to wash BUE, chest and abdomen seated at sink level. Lower Body Bathing: Minimal assistance;Sit to/from stand Lower Body Bathing Details (indicate cue type and reason): Min A to wash buttocks in standing for thoroughness. Patient with difficutly reaching 2/2 body habitus. Upper Body Dressing : Set up;Sitting       Toilet Transfer: Min Fish farm manager Details (indicate cue type and reason): Min guard with and without AD. Toileting- Water quality scientist and Hygiene: Min guard;Sit to/from stand Toileting - Clothing Manipulation Details (indicate cue type and reason): Min guard for hygiene in standing after voiding bladder.     Functional mobility during ADLs: Min guard;Rolling walker General ADL Comments: Patient limited by decreased activity tolerance, decreased balance and generalized weakness.     Vision         Perception     Praxis      Pertinent Vitals/Pain Pain Assessment: No/denies pain Pain Intervention(s): Monitored during session     Hand Dominance     Extremity/Trunk Assessment Upper Extremity Assessment Upper Extremity Assessment: Overall [facility]  for tasks assessed   Lower Extremity Assessment Lower Extremity Assessment: Defer to PT evaluation   Cervical / Trunk Assessment Cervical / Trunk Assessment: Normal   Communication Communication Communication: No difficulties   Cognition Arousal/Alertness: Awake/alert Behavior  During Therapy: WFL for tasks assessed/performed Overall Cognitive Status: Within Functional Limits for tasks assessed                                     General Comments  Several small open sores on buttocks. Appear to be healing.    Exercises     Shoulder Instructions      Home Living Family/patient expects to be discharged to:: Private residence Living Arrangements: Alone   Type of Home: Apartment Home Access: Stairs to enter [employer] of Steps: 1 flight Entrance Stairs-Rails: Right Home Layout: One level     Bathroom Shower/Tub: Teacher, early years/pre: Standard     Home Equipment: Cane - single point          Prior Functioning/Environment Level of Independence: Independent with assistive device(s)        Comments: independent up until recently. Using cane for last week or so. Works part-time at [employer].        OT Problem List: Decreased strength;Decreased activity tolerance;Impaired balance (sitting and/or standing);Decreased knowledge of use of DME or AE      OT Treatment/Interventions: Self-care/ADL training;Therapeutic exercise;Energy conservation;DME and/or AE instruction;Therapeutic activities;Patient/family education;Balance training    OT Goals(Current goals can be found in the care plan section) Acute Rehab OT Goals Patient Stated Goal: to regain independence OT Goal Formulation: With patient Time For Goal Achievement: 12/10/20 Potential to Achieve Goals: Good ADL Goals Additional ADL Goal #1: Patient will complete a.m. ADLs with Mod I, LRAD and good safety awareness. Additional ADL Goal #2: Patient will complete ADL transfers with Mod I and LRAD with good safety awareness. Additional ADL Goal #3: Patient will recall 3 fall reducing strategies to decrease risk of falls.  OT Frequency: Min 2X/week   Barriers to D/C: Inaccessible home environment;Decreased caregiver support  Lives alone in 2nd floor  apartment.       Co-evaluation              AM-PAC OT "6 Clicks" Daily Activity     Outcome Measure Help from another person eating meals?: None Help from another person taking care of personal grooming?: A Little Help from another person toileting, which includes using toliet, bedpan, or urinal?: A Little Help from another person bathing (including washing, rinsing, drying)?: A Little Help from another person to put on and taking off regular upper body clothing?: A Little Help from another person to put on and taking off regular lower body clothing?: A Little 6 Click Score: 19   End of Session Equipment Utilized During Treatment: Rolling walker  Activity Tolerance: Patient tolerated treatment well Patient left: in chair;with call bell/phone within reach;with chair alarm set  OT Visit Diagnosis: Unsteadiness on feet (R26.81);Other abnormalities of gait and mobility (R26.89);History of falling (Z91.81);Muscle weakness (generalized) (M62.81)                Time: 5176-1607 OT Time Calculation (min): 28 min Charges:  OT General Charges $OT Visit: 1 Visit OT Evaluation $OT Eval Low Complexity: 1 Low OT Treatments $Self Care/Home Management : 8-22 mins  Yessika Otte H. OTR/L Supplemental OT, Department of rehab services [phone]  Lannie Heaps R H.  11/26/2020, 10:44 AM

## 2020-11-26 NOTE — NC FL2 (Signed)
[place] LEVEL OF CARE SCREENING TOOL     IDENTIFICATION  Patient Name: Kristine Bauer Birthdate: 08/10/1966 Sex: female Admission Date (Current Location): 11/24/2020  [facility] and [place] Number:  Herbalist and Address:  [facility],  [address], [place]      Provider Number: [id number]  Attending Physician Name and Address:  Bonnielee Haff, MD  Relative Name and Phone Number:  Zhanna Melin (aunt) Ph: [phone]    Current Level of Care: Hospital Recommended Level of Care: [place] Prior Approval Number:    Date Approved/Denied:   PASRR Number: [id number]  Discharge Plan: SNF    Current Diagnoses: Patient Active Problem List   Diagnosis Date Noted  . Myositis of both lower legs 11/25/2020  . Elevated CK 11/25/2020  . Elevated liver enzymes 11/25/2020  . Chronic diastolic heart failure ([place]) 01/01/2020  . Leg edema 11/08/2019  . Proximal leg weakness 08/12/2018  . ETD (Eustachian tube dysfunction), bilateral 04/20/2017  . Nasal turbinate hypertrophy 04/20/2017  . Seasonal allergic rhinitis 04/20/2017  . Diabetes mellitus, type 2 ([place])   . Chest pain of uncertain etiology   . HTN (hypertension)   . Asthma   . GERD (gastroesophageal reflux disease)   . Anemia     Orientation RESPIRATION BLADDER Height & Weight     Self,Time,Situation,Place  Normal Continent Weight:   Height:     BEHAVIORAL SYMPTOMS/MOOD NEUROLOGICAL BOWEL NUTRITION STATUS      Continent Diet (Carb modified)  AMBULATORY STATUS COMMUNICATION OF NEEDS Skin   Extensive Assist Verbally Normal                       Personal Care Assistance Level of Assistance  Bathing,Feeding,Dressing Bathing Assistance: Limited assistance Feeding assistance: Independent Dressing Assistance: Limited assistance     Functional Limitations Info  Sight,Hearing,Speech Sight Info: Impaired Hearing Info: Adequate Speech Info:  Adequate    SPECIAL CARE FACTORS FREQUENCY  PT (By licensed PT),OT (By licensed OT)     PT Frequency: 5x's/week OT Frequency: 5x's/week            Contractures Contractures Info: Not present    Additional Factors Info  Code Status,Allergies,Psychotropic,Insulin Sliding Scale Code Status Info: Full Allergies Info: Statins Psychotropic Info: Xanax, gabapentin Insulin Sliding Scale Info: See discharge summary       Current Medications (11/26/2020):  This is the current hospital active medication list Current Facility-Administered Medications  Medication Dose Route Frequency Provider Last Rate Last Admin  . acetaminophen (TYLENOL) tablet 650 mg  650 mg Oral Q6H PRN Doran Heater, DO   650 mg at 11/26/20 4315   Or  . acetaminophen (TYLENOL) suppository 650 mg  650 mg Rectal Q6H PRN Doran Heater, DO      . albuterol (VENTOLIN HFA) 108 (90 Base) MCG/ACT inhaler 2 puff  2 puff Inhalation Q6H PRN Bonnielee Haff, MD      . ALPRAZolam Duanne Moron) tablet 0.5 mg  0.5 mg Oral TID PRN Bonnielee Haff, MD   0.5 mg at 11/26/20 0528  . amLODipine (NORVASC) tablet 5 mg  5 mg Oral Daily Bonnielee Haff, MD   5 mg at 11/26/20 1048  . enoxaparin (LOVENOX) injection 40 mg  40 mg Subcutaneous Q24H MacNeil, Richard G, DO   40 mg at 11/26/20 1048  . gabapentin (NEURONTIN) capsule 600 mg  600 mg Oral BID Bonnielee Haff, MD   600 mg at 11/26/20 1048  .  guaiFENesin (ROBITUSSIN) 100 MG/5ML solution 100 mg  5 mL Oral Q4H PRN Bonnielee Haff, MD      . insulin aspart (novoLOG) injection 0-15 Units  0-15 Units Subcutaneous TID WC Doran Heater, DO   5 Units at 11/26/20 0809  . insulin aspart (novoLOG) injection 0-5 Units  0-5 Units Subcutaneous QHS Doran Heater, DO   5 Units at 11/25/20 2157  . insulin glargine (LANTUS) injection 28 Units  28 Units Subcutaneous QHS Bonnielee Haff, MD   28 Units at 11/25/20 2156  . irbesartan (AVAPRO) tablet 150 mg  150 mg Oral Daily Bonnielee Haff, MD    150 mg at 11/26/20 1048  . montelukast (SINGULAIR) tablet 10 mg  10 mg Oral QHS Bonnielee Haff, MD   10 mg at 11/25/20 2155  . ondansetron (ZOFRAN) tablet 4 mg  4 mg Oral Q6H PRN Luna Fuse, Richard G, DO       Or  . ondansetron (ZOFRAN) injection 4 mg  4 mg Intravenous Q6H PRN MacNeil, Richard G, DO      . oxymetazoline (AFRIN) 0.05 % nasal spray 1 spray  1 spray Each Nare BID Bonnielee Haff, MD   1 spray at 11/26/20 1049  . pantoprazole (PROTONIX) EC tablet 40 mg  40 mg Oral Daily Bonnielee Haff, MD   40 mg at 11/26/20 1048  . polyethylene glycol (MIRALAX / GLYCOLAX) packet 17 g  17 g Oral Daily PRN MacNeil, Richard G, DO      . sodium chloride (OCEAN) 0.65 % nasal spray 1 spray  1 spray Each Nare PRN Doran Heater, DO   1 spray at 11/25/20 0052     Discharge Medications: Please see discharge summary for a list of discharge medications.  Relevant Imaging Results:  Relevant Lab Results:   Additional Information SSN: [id number]  Sherie Don, LCSW

## 2020-11-26 NOTE — Consult Note (Signed)
Reason for Consult:weakness Referring Physician: Dr Loreli Dollar Kristine Bauer is an 55 y.o. female.  HPI: 60 yof with PMH of myositis, DM, HTN who is here for weakness. Has previously not wanted a biopsy.  She has complaints of weakness for 2 years.  Request for biopsy     Past Medical History:  Diagnosis Date  . Allergic rhinitis   . Anemia   . Asthma   . Chest pain    LEFT SIDED-SEVERAL YEARS  . Chronic diastolic heart failure ([place]) 01/01/2020  . Diabetes mellitus, type 2 ([place])   . ETD (Eustachian tube dysfunction), bilateral 04/20/2017  . GERD (gastroesophageal reflux disease)   . HTN (hypertension)   . Hyperlipidemia   . Mild nonproliferative diabetic retinopathy of both eyes ([place])   . Nasal turbinate hypertrophy 04/20/2017  . Vitamin D deficiency     Past Surgical History:  Procedure Laterality Date  . ENDOMETRIAL BIOPSY      Family History  Problem Relation Age of Onset  . Hypertension Father   . Glaucoma Father   . Hypertension Mother   . Diabetes type II Mother   . Multiple sclerosis Sister   . Breast cancer Neg Hx     Social History:  reports that she has never smoked. She has never used smokeless tobacco. She reports that she does not drink alcohol and does not use drugs.  Allergies:  Allergies  Allergen Reactions  . Statins Other (See Comments)    Myopathy    Medications: I have reviewed the patient's current medications.  Results for orders placed or performed during the hospital encounter of 11/24/20 (from the past 48 hour(s))  CBC with Differential/Platelet     Status: Abnormal   Collection Time: 11/24/20  8:52 PM  Result Value Ref Range   WBC 6.9 4.0 - 10.5 K/uL   RBC 4.45 3.87 - 5.11 MIL/uL   Hemoglobin 11.1 (L) 12.0 - 15.0 g/dL   HCT 36.5 36.0 - 46.0 %   MCV 82.0 80.0 - 100.0 fL   MCH 24.9 (L) 26.0 - 34.0 pg   MCHC 30.4 30.0 - 36.0 g/dL   RDW 14.7 11.5 - 15.5 %   Platelets 327 150 - 400 K/uL   nRBC 0.0 0.0 - 0.2 %   Neutrophils  Relative % 63 %   Neutro Abs 4.3 1.7 - 7.7 K/uL   Lymphocytes Relative 26 %   Lymphs Abs 1.8 0.7 - 4.0 K/uL   Monocytes Relative 8 %   Monocytes Absolute 0.6 0.1 - 1.0 K/uL   Eosinophils Relative 3 %   Eosinophils Absolute 0.2 0.0 - 0.5 K/uL   Basophils Relative 0 %   Basophils Absolute 0.0 0.0 - 0.1 K/uL   Immature Granulocytes 0 %   Abs Immature Granulocytes 0.02 0.00 - 0.07 K/uL    Comment: Performed at [facility], [place] [address], [place], [place] [address]  Comprehensive metabolic panel     Status: Abnormal   Collection Time: 11/24/20  8:52 PM  Result Value Ref Range   Sodium 134 (L) 135 - 145 mmol/L   Potassium 4.5 3.5 - 5.1 mmol/L   Chloride 97 (L) 98 - 111 mmol/L   CO2 26 22 - 32 mmol/L   Glucose, Bld 360 (H) 70 - 99 mg/dL    Comment: Glucose reference range applies only to samples taken after fasting for at least 8 hours.   BUN 21 (H) 6 - 20 mg/dL   Creatinine, Ser 0.70  0.44 - 1.00 mg/dL   Calcium 9.5 8.9 - 10.3 mg/dL   Total Protein 7.4 6.5 - 8.1 g/dL   Albumin 3.8 3.5 - 5.0 g/dL   AST 114 (H) 15 - 41 U/L   ALT 232 (H) 0 - 44 U/L   Alkaline Phosphatase 67 38 - 126 U/L   Total Bilirubin 0.6 0.3 - 1.2 mg/dL   GFR, Estimated >60 >60 mL/min    Comment: (NOTE) Calculated using the CKD-EPI Creatinine Equation (2021)    Anion gap 11 5 - 15    Comment: Performed at [facility], [place] [address] [place], [place] [address]  CK     Status: Abnormal   Collection Time: 11/24/20  8:52 PM  Result Value Ref Range   Total CK 7,517 (H) 38 - 234 U/L    Comment: RESULTS CONFIRMED BY MANUAL DILUTION CRITICAL RESULT CALLED TO, READ BACK BY AND VERIFIED WITH: C.SAWYER RN 11/24/20 @2241  BY P.HENDERSON Performed at [facility], [place] [address], [place], [place] [address]   Brain natriuretic peptide     Status: None   Collection Time: 11/24/20 10:12 PM  Result Value Ref Range   B Natriuretic Peptide 19.6 0.0 - 100.0 pg/mL     Comment: Performed at [facility], [place] [address] [place], [place] [address]  POC occult blood, ED     Status: None   Collection Time: 11/24/20 10:22 PM  Result Value Ref Range   Fecal Occult Bld NEGATIVE NEGATIVE  Resp Panel by RT-PCR (Flu A&B, Covid) Nasopharyngeal Swab     Status: None   Collection Time: 11/24/20 11:30 PM   Specimen: Nasopharyngeal Swab; Nasopharyngeal(NP) swabs in vial transport medium  Result Value Ref Range   SARS Coronavirus 2 by RT PCR NEGATIVE NEGATIVE    Comment: (NOTE) SARS-CoV-2 target nucleic acids are NOT DETECTED.  The SARS-CoV-2 RNA is generally detectable in upper respiratory specimens during the acute phase of infection. The lowest concentration of SARS-CoV-2 viral copies this assay can detect is 138 copies/mL. A negative result does not preclude SARS-Cov-2 infection and should not be used as the sole basis for treatment or other patient management decisions. A negative result may occur with  improper specimen collection/handling, submission of specimen other than nasopharyngeal swab, presence of viral mutation(s) within the areas targeted by this assay, and inadequate number of viral copies(<138 copies/mL). A negative result must be combined with clinical observations, patient history, and epidemiological information. The expected result is Negative.  Fact Sheet for Patients:  EntrepreneurPulse.com.au  Fact Sheet for Healthcare Providers:  IncredibleEmployment.be  This test is no t yet approved or cleared by the [place] FDA and  has been authorized for detection and/or diagnosis of SARS-CoV-2 by FDA under an Emergency Use Authorization (EUA). This EUA will remain  in effect (meaning this test can be used) for the duration of the COVID-19 declaration under Section 564(b)(1) of the Act, 21 U.S.C.section 360bbb-3(b)(1), unless the authorization is terminated  or revoked sooner.        Influenza A by PCR NEGATIVE NEGATIVE   Influenza B by PCR NEGATIVE NEGATIVE    Comment: (NOTE) The Xpert Xpress SARS-CoV-2/FLU/RSV plus assay is intended as an aid in the diagnosis of influenza from Nasopharyngeal swab specimens and should not be used as a sole basis for treatment. Nasal washings and aspirates are unacceptable for Xpert Xpress SARS-CoV-2/FLU/RSV testing.  Fact Sheet for Patients: EntrepreneurPulse.com.au  Fact Sheet for Healthcare Providers: IncredibleEmployment.be  This  test is not yet approved or cleared by the [place] and has been authorized for detection and/or diagnosis of SARS-CoV-2 by FDA under an Emergency Use Authorization (EUA). This EUA will remain in effect (meaning this test can be used) for the duration of the COVID-19 declaration under Section 564(b)(1) of the Act, 21 U.S.C. section 360bbb-3(b)(1), unless the authorization is terminated or revoked.  Performed at [facility], [place] [address], [place], [place] [address]   CBG monitoring, ED     Status: Abnormal   Collection Time: 11/25/20 12:38 AM  Result Value Ref Range   Glucose-Capillary 255 (H) 70 - 99 mg/dL    Comment: Glucose reference range applies only to samples taken after fasting for at least 8 hours.  Magnesium     Status: None   Collection Time: 11/25/20  1:00 AM  Result Value Ref Range   Magnesium 2.0 1.7 - 2.4 mg/dL    Comment: Performed at [facility], [place] [address], [place], [place] [address]  Phosphorus     Status: None   Collection Time: 11/25/20  1:00 AM  Result Value Ref Range   Phosphorus 4.4 2.5 - 4.6 mg/dL    Comment: Performed at [facility], [place] [address] [place], [place] [address]  Hemoglobin A1c     Status: Abnormal   Collection Time: 11/25/20  6:53 AM  Result Value Ref Range   Hgb A1c MFr Bld 12.5 (H) 4.8 - 5.6 %    Comment: (NOTE) Pre diabetes:           5.7%-6.4%  Diabetes:              >6.4%  Glycemic control for   <7.0% adults with diabetes    Mean Plasma Glucose 312.05 mg/dL    Comment: Performed at [place] [address], [place], [place] [address]  Comprehensive metabolic panel     Status: Abnormal   Collection Time: 11/25/20  6:53 AM  Result Value Ref Range   Sodium 135 135 - 145 mmol/L   Potassium 3.6 3.5 - 5.1 mmol/L    Comment: DELTA CHECK NOTED   Chloride 101 98 - 111 mmol/L   CO2 25 22 - 32 mmol/L   Glucose, Bld 301 (H) 70 - 99 mg/dL    Comment: Glucose reference range applies only to samples taken after fasting for at least 8 hours.   BUN 15 6 - 20 mg/dL   Creatinine, Ser 0.53 0.44 - 1.00 mg/dL   Calcium 8.7 (L) 8.9 - 10.3 mg/dL   Total Protein 6.5 6.5 - 8.1 g/dL   Albumin 3.3 (L) 3.5 - 5.0 g/dL   AST 91 (H) 15 - 41 U/L   ALT 184 (H) 0 - 44 U/L   Alkaline Phosphatase 58 38 - 126 U/L   Total Bilirubin 0.5 0.3 - 1.2 mg/dL   GFR, Estimated >60 >60 mL/min    Comment: (NOTE) Calculated using the CKD-EPI Creatinine Equation (2021)    Anion gap 9 5 - 15    Comment: Performed at [facility], [place] [address], [place],  [address]  CBC     Status: Abnormal   Collection Time: 11/25/20  6:53 AM  Result Value Ref Range   WBC 7.6 4.0 - 10.5 K/uL   RBC 4.17 3.87 - 5.11 MIL/uL   Hemoglobin 10.4 (L) 12.0 - 15.0 g/dL   HCT 33.9 (L) 36.0 - 46.0 %   MCV  81.3 80.0 - 100.0 fL   MCH 24.9 (L) 26.0 - 34.0 pg   MCHC 30.7 30.0 - 36.0 g/dL   RDW 14.6 11.5 - 15.5 %   Platelets 317 150 - 400 K/uL   nRBC 0.0 0.0 - 0.2 %    Comment: Performed at [facility], [place] [address] [place], [place] [address]  Glucose, capillary     Status: Abnormal   Collection Time: 11/25/20  8:28 AM  Result Value Ref Range   Glucose-Capillary 269 (H) 70 - 99 mg/dL    Comment: Glucose reference range applies only to samples taken after fasting for at least 8 hours.  Troponin I (High Sensitivity)      Status: None   Collection Time: 11/25/20  9:13 AM  Result Value Ref Range   Troponin I (High Sensitivity) 13 <18 ng/L    Comment: (NOTE) Elevated high sensitivity troponin I (hsTnI) values and significant  changes across serial measurements may suggest ACS but many other  chronic and acute conditions are known to elevate hsTnI results.  Refer to the "Links" section for chest pain algorithms and additional  guidance. Performed at [facility], [place] [address], [place], [place] [address]   HIV Antibody (routine testing w rflx)     Status: None   Collection Time: 11/25/20  9:14 AM  Result Value Ref Range   HIV Screen 4th Generation wRfx Non Reactive Non Reactive    Comment: Performed at [facility], [place] [address], [place], [place] [address]  Glucose, capillary     Status: Abnormal   Collection Time: 11/25/20 11:14 AM  Result Value Ref Range   Glucose-Capillary 234 (H) 70 - 99 mg/dL    Comment: Glucose reference range applies only to samples taken after fasting for at least 8 hours.  Troponin I (High Sensitivity)     Status: None   Collection Time: 11/25/20 12:21 PM  Result Value Ref Range   Troponin I (High Sensitivity) 13 <18 ng/L    Comment: (NOTE) Elevated high sensitivity troponin I (hsTnI) values and significant  changes across serial measurements may suggest ACS but many other  chronic and acute conditions are known to elevate hsTnI results.  Refer to the "Links" section for chest pain algorithms and additional  guidance. Performed at [facility], [place] [address], [place], [place] [address]   Glucose, capillary     Status: Abnormal   Collection Time: 11/25/20  4:27 PM  Result Value Ref Range   Glucose-Capillary 286 (H) 70 - 99 mg/dL    Comment: Glucose reference range applies only to samples taken after fasting for at least 8 hours.  Glucose, capillary     Status: Abnormal   Collection Time: 11/25/20  9:01 PM  Result Value Ref  Range   Glucose-Capillary 389 (H) 70 - 99 mg/dL    Comment: Glucose reference range applies only to samples taken after fasting for at least 8 hours.  CBC     Status: Abnormal   Collection Time: 11/26/20  5:06 AM  Result Value Ref Range   WBC 7.2 4.0 - 10.5 K/uL   RBC 4.22 3.87 - 5.11 MIL/uL   Hemoglobin 10.5 (L) 12.0 - 15.0 g/dL   HCT 35.1 (L) 36.0 - 46.0 %   MCV 83.2 80.0 - 100.0 fL   MCH 24.9 (L) 26.0 - 34.0 pg   MCHC 29.9 (L) 30.0 - 36.0 g/dL   RDW 14.7 11.5 - 15.5 %  Platelets 292 150 - 400 K/uL   nRBC 0.0 0.0 - 0.2 %    Comment: Performed at [facility], [place] [address] [place], [place] [address]  Comprehensive metabolic panel     Status: Abnormal   Collection Time: 11/26/20  5:06 AM  Result Value Ref Range   Sodium 136 135 - 145 mmol/L   Potassium 4.2 3.5 - 5.1 mmol/L   Chloride 105 98 - 111 mmol/L   CO2 24 22 - 32 mmol/L   Glucose, Bld 196 (H) 70 - 99 mg/dL    Comment: Glucose reference range applies only to samples taken after fasting for at least 8 hours.   BUN 12 6 - 20 mg/dL   Creatinine, Ser 0.39 (L) 0.44 - 1.00 mg/dL   Calcium 8.9 8.9 - 10.3 mg/dL   Total Protein 6.3 (L) 6.5 - 8.1 g/dL   Albumin 3.2 (L) 3.5 - 5.0 g/dL   AST 84 (H) 15 - 41 U/L   ALT 167 (H) 0 - 44 U/L   Alkaline Phosphatase 53 38 - 126 U/L   Total Bilirubin 0.6 0.3 - 1.2 mg/dL   GFR, Estimated >60 >60 mL/min    Comment: (NOTE) Calculated using the CKD-EPI Creatinine Equation (2021)    Anion gap 7 5 - 15    Comment: Performed at [facility], [place] [address] [place], [place] [address]  Vitamin B12     Status: Abnormal   Collection Time: 11/26/20  5:06 AM  Result Value Ref Range   Vitamin B-12 987 (H) 180 - 914 pg/mL    Comment: (NOTE) This assay is not validated for testing neonatal or myeloproliferative syndrome specimens for Vitamin B12 levels. Performed at [facility], [place] [address] [place], [place] [address]   Folate      Status: None   Collection Time: 11/26/20  5:06 AM  Result Value Ref Range   Folate 20.9 >5.9 ng/mL    Comment: Performed at [facility], [place] [address], [place], [place] [address]  Iron and TIBC     Status: None   Collection Time: 11/26/20  5:06 AM  Result Value Ref Range   Iron 42 28 - 170 ug/dL   TIBC 376 250 - 450 ug/dL   Saturation Ratios 11 10.4 - 31.8 %   UIBC 334 ug/dL    Comment: Performed at [facility], [place] [address], [place], [place] [address]  Ferritin     Status: None   Collection Time: 11/26/20  5:06 AM  Result Value Ref Range   Ferritin 21 11 - 307 ng/mL    Comment: Performed at [facility], [place] [address], [place], [place] [address]  Reticulocytes     Status: None   Collection Time: 11/26/20  5:06 AM  Result Value Ref Range   Retic Ct Pct 1.3 0.4 - 3.1 %   RBC. 4.21 3.87 - 5.11 MIL/uL   Retic Count, Absolute 53.0 19.0 - 186.0 K/uL   Immature Retic Fract 15.2 2.3 - 15.9 %    Comment: Performed at [facility], [place] [address] [place], [place] [address]  Glucose, capillary     Status: Abnormal   Collection Time: 11/26/20  7:41 AM  Result Value Ref Range   Glucose-Capillary 205 (H) 70 - 99 mg/dL    Comment: Glucose reference range applies only to samples taken after fasting for at least 8 hours.    MR THORACIC  SPINE WO CONTRAST  Result Date: 11/25/2020 CLINICAL DATA:  Progressive back pain and neurologic deficit. History of neuropathy and myopathy. Bilateral lower extremity weakness. Unable to ambulate. EXAM: MRI THORACIC AND LUMBAR SPINE WITHOUT CONTRAST TECHNIQUE: Multiplanar and multiecho pulse sequences of the thoracic and lumbar spine were obtained without intravenous contrast. COMPARISON:  None. FINDINGS: MRI THORACIC SPINE FINDINGS Alignment:  Normal Vertebrae: Normal marrow signal.  No bone lesions or fractures. Cord:  Normal cord signal intensity.  No cord lesions or syrinx.  Paraspinal and other soft tissues: No significant paraspinal findings. No obvious mediastinal mass or lung lesions. No pleural effusions. Disc levels: Mild multilevel degenerative disc disease with disc space narrowing and osteophytic spurring. No large disc protrusions or significant spinal stenosis. Multilevel shallow disc osteophyte complexes are noted without significant mass effect on the thecal sac or spinal cord. Shallow right-sided disc osteophyte complex at T2-3 with mild impression on the right side of the thecal sac. Degenerative disc disease at T7-8 with shallow bilateral disc osteophyte complexes and mild impression on both sides of the thecal sac. Right-sided disc osteophyte complex at T8-9. Bilateral disc osteophyte complexes at T9-10. Small central disc protrusion at T11-12. MRI LUMBAR SPINE FINDINGS Segmentation: There are five lumbar type vertebral bodies. The last full intervertebral disc space is labeled L5-S1. This correlates with the lumbar spine numbering. Alignment:  Normal Vertebrae:  Normal marrow signal.  No bone lesions or fractures. Conus medullaris and cauda equina: Conus extends to the T12-L1 level. Conus and cauda equina appear normal. Paraspinal and other soft tissues: No significant paraspinal or retroperitoneal findings. Disc levels: The spinal canal is quite generous. No significant spinal or foraminal stenosis. L1-2: No significant findings. L2-3: Mild annular bulge with slight impression on both sides of the thecal sac and mild lateral recess encroachment. No significant spinal or foraminal stenosis. L3-4: Mild bulging annulus and mild facet disease with mild bilateral lateral recess encroachment. No significant spinal or foraminal stenosis. L4-5: Moderate facet disease but no disc protrusions, spinal or foraminal stenosis. L5-S1: Shallow broad-based right paracentral and medial foraminal disc protrusion. There is slight flattening of the ventral thecal sac and potential  irritation of the right S1 nerve root. The exiting L5 nerve roots appear normal. IMPRESSION: 1. Mild multilevel degenerative thoracic spondylosis with disc space narrowing and osteophytic spurring. Multilevel shallow disc osteophyte complexes bilaterally with mild impression on the thecal sac. No significant spinal stenosis. 2. Small central disc protrusion at T11-12. 3. Shallow broad-based right paracentral and medial foraminal disc protrusion at L5-S1. No significant neural compression but potential irritation of the right S1 nerve root. 4. Mild bilateral lateral recess encroachment at L2-3 and L3-4. 5. Unremarkable MR appearance of the thoracic spinal cord. No cord lesions or syrinx is identified. Electronically Signed   By: Marijo Sanes M.D.   On: 11/25/2020 08:57   MR LUMBAR SPINE WO CONTRAST  Result Date: 11/25/2020 CLINICAL DATA:  Progressive back pain and neurologic deficit. History of neuropathy and myopathy. Bilateral lower extremity weakness. Unable to ambulate. EXAM: MRI THORACIC AND LUMBAR SPINE WITHOUT CONTRAST TECHNIQUE: Multiplanar and multiecho pulse sequences of the thoracic and lumbar spine were obtained without intravenous contrast. COMPARISON:  None. FINDINGS: MRI THORACIC SPINE FINDINGS Alignment:  Normal Vertebrae: Normal marrow signal.  No bone lesions or fractures. Cord:  Normal cord signal intensity.  No cord lesions or syrinx. Paraspinal and other soft tissues: No significant paraspinal findings. No obvious mediastinal mass or lung lesions. No pleural effusions. Disc levels: Mild multilevel  degenerative disc disease with disc space narrowing and osteophytic spurring. No large disc protrusions or significant spinal stenosis. Multilevel shallow disc osteophyte complexes are noted without significant mass effect on the thecal sac or spinal cord. Shallow right-sided disc osteophyte complex at T2-3 with mild impression on the right side of the thecal sac. Degenerative disc disease at T7-8  with shallow bilateral disc osteophyte complexes and mild impression on both sides of the thecal sac. Right-sided disc osteophyte complex at T8-9. Bilateral disc osteophyte complexes at T9-10. Small central disc protrusion at T11-12. MRI LUMBAR SPINE FINDINGS Segmentation: There are five lumbar type vertebral bodies. The last full intervertebral disc space is labeled L5-S1. This correlates with the lumbar spine numbering. Alignment:  Normal Vertebrae:  Normal marrow signal.  No bone lesions or fractures. Conus medullaris and cauda equina: Conus extends to the T12-L1 level. Conus and cauda equina appear normal. Paraspinal and other soft tissues: No significant paraspinal or retroperitoneal findings. Disc levels: The spinal canal is quite generous. No significant spinal or foraminal stenosis. L1-2: No significant findings. L2-3: Mild annular bulge with slight impression on both sides of the thecal sac and mild lateral recess encroachment. No significant spinal or foraminal stenosis. L3-4: Mild bulging annulus and mild facet disease with mild bilateral lateral recess encroachment. No significant spinal or foraminal stenosis. L4-5: Moderate facet disease but no disc protrusions, spinal or foraminal stenosis. L5-S1: Shallow broad-based right paracentral and medial foraminal disc protrusion. There is slight flattening of the ventral thecal sac and potential irritation of the right S1 nerve root. The exiting L5 nerve roots appear normal. IMPRESSION: 1. Mild multilevel degenerative thoracic spondylosis with disc space narrowing and osteophytic spurring. Multilevel shallow disc osteophyte complexes bilaterally with mild impression on the thecal sac. No significant spinal stenosis. 2. Small central disc protrusion at T11-12. 3. Shallow broad-based right paracentral and medial foraminal disc protrusion at L5-S1. No significant neural compression but potential irritation of the right S1 nerve root. 4. Mild bilateral lateral  recess encroachment at L2-3 and L3-4. 5. Unremarkable MR appearance of the thoracic spinal cord. No cord lesions or syrinx is identified. Electronically Signed   By: Marijo Sanes M.D.   On: 11/25/2020 08:57    Review of Systems  Neurological: Positive for weakness.   Blood pressure (!) 147/65, pulse (!) 107, temperature 98.5 F (36.9 C), temperature source Oral, resp. rate 16, SpO2 99 %. Physical Exam Constitutional:      Appearance: She is obese.  Cardiovascular:     Rate and Rhythm: Normal rate and regular rhythm.  Pulmonary:     Effort: Pulmonary effort is normal.  Musculoskeletal:        General: No tenderness.     Right lower leg: No edema.     Left lower leg: No edema.  Skin:    General: Skin is warm and dry.  Neurological:     Mental Status: She is alert.     Assessment/Plan: Right thigh muscle biopsy -patient still hesitant as she does not want a scar.  She certainly does not want two sites Discussed mm biopsy in am right thigh with her  Rolm Bookbinder 11/26/2020, 10:32 AM

## 2020-11-26 NOTE — TOC Initial Note (Signed)
Transition of Care [facility]) - Initial/Assessment Note   Patient Details  Name: Kristine Bauer MRN: [id number] Date of Birth: 02-19-1966  Transition of Care [facility]) CM/SW Contact:    Sherie Don, LCSW Phone Number: 11/26/2020, 11:22 AM  Clinical Narrative: Patient is a 55 year old female who was admitted for myositis of both lower legs. OT evaluation recommended SNF.  CSW met with patient and patient is agreeable to SNF. FL2 completed; PASRR received. Initial referral faxed out. TOC awaiting bed offers.  Expected Discharge Plan: Skilled Nursing Facility Barriers to Discharge: Continued Medical Work up  Patient Goals and CMS Choice Patient states their goals for this hospitalization and ongoing recovery are:: Get stronger CMS Medicare.gov Compare Post Acute Care list provided to:: Patient Choice offered to / list presented to : Patient  Expected Discharge Plan and Services Expected Discharge Plan: [place] In-house Referral: Clinical Social Work Post Acute Care Choice: [place] Living arrangements for the past 2 months: Apartment  Prior Living Arrangements/Services Living arrangements for the past 2 months: Apartment Lives with:: Self Patient language and need for interpreter reviewed:: Yes Do you feel safe going back to the place where you live?: Yes      Need for Family Participation in Patient Care: No (Comment) Care giver support system in place?: Yes (comment) Criminal Activity/Legal Involvement Pertinent to Current Situation/Hospitalization: No - Comment as needed  Activities of Daily Living Home Assistive Devices/Equipment: Cane (specify quad or straight) ADL Screening (condition at time of admission) Patient's cognitive ability adequate to safely complete daily activities?: Yes Is the patient deaf or have difficulty hearing?: No Does the patient have difficulty seeing, even when wearing glasses/contacts?: No Does the patient have  difficulty concentrating, remembering, or making decisions?: No Patient able to express need for assistance with ADLs?: Yes Does the patient have difficulty dressing or bathing?: Yes Independently performs ADLs?: Yes (appropriate for developmental age) Does the patient have difficulty walking or climbing stairs?: Yes Weakness of Legs: Both Weakness of Arms/Hands: None  Permission Sought/Granted Permission sought to share information with : Facility Art therapist granted to share information with : Yes, Verbal Permission Granted Permission granted to share info w AGENCY: SNFs  Emotional Assessment Appearance:: Appears stated age Attitude/Demeanor/Rapport: Engaged Affect (typically observed): Accepting Orientation: : Oriented to Self,Oriented to Place,Oriented to  Time,Oriented to Situation Alcohol / Substance Use: Not Applicable Psych Involvement: No (comment)  Admission diagnosis:  Myositis of both lower legs [M60.9] Patient Active Problem List   Diagnosis Date Noted  . Myositis of both lower legs 11/25/2020  . Elevated CK 11/25/2020  . Elevated liver enzymes 11/25/2020  . Chronic diastolic heart failure ([place]) 01/01/2020  . Leg edema 11/08/2019  . Proximal leg weakness 08/12/2018  . ETD (Eustachian tube dysfunction), bilateral 04/20/2017  . Nasal turbinate hypertrophy 04/20/2017  . Seasonal allergic rhinitis 04/20/2017  . Diabetes mellitus, type 2 ([place])   . Chest pain of uncertain etiology   . HTN (hypertension)   . Asthma   . GERD (gastroesophageal reflux disease)   . Anemia    PCP:  Nicolette Bang, DO Pharmacy:   [id number] [place], [address] [place] [address]Phone: [phone]: [phone] Readmission Risk Interventions No flowsheet data found.

## 2020-11-27 ENCOUNTER — Inpatient Hospital Stay (HOSPITAL_COMMUNITY): Payer: 59 | Admitting: Certified Registered Nurse Anesthetist

## 2020-11-27 ENCOUNTER — Encounter (HOSPITAL_COMMUNITY)
Admission: EM | Disposition: A | Discharge status: Skilled Nursing Facility | Payer: Self-pay | Source: Home / Self Care | Attending: Internal Medicine

## 2020-11-27 ENCOUNTER — Encounter (HOSPITAL_COMMUNITY): Payer: Self-pay | Admitting: Internal Medicine

## 2020-11-27 DIAGNOSIS — E1169 Type 2 diabetes mellitus with other specified complication: Secondary | ICD-10-CM | POA: Diagnosis not present

## 2020-11-27 DIAGNOSIS — M609 Myositis, unspecified: Secondary | ICD-10-CM | POA: Diagnosis not present

## 2020-11-27 DIAGNOSIS — I10 Essential (primary) hypertension: Secondary | ICD-10-CM | POA: Diagnosis not present

## 2020-11-27 DIAGNOSIS — Z794 Long term (current) use of insulin: Secondary | ICD-10-CM | POA: Diagnosis not present

## 2020-11-27 HISTORY — PX: MUSCLE BIOPSY: SHX716

## 2020-11-27 LAB — CBC
HCT: 36.5 % (ref 36.0–46.0)
Hemoglobin: 11.1 g/dL — ABNORMAL LOW (ref 12.0–15.0)
MCH: 24.9 pg — ABNORMAL LOW (ref 26.0–34.0)
MCHC: 30.4 g/dL (ref 30.0–36.0)
MCV: 81.8 fL (ref 80.0–100.0)
Platelets: 314 10*3/uL (ref 150–400)
RBC: 4.46 MIL/uL (ref 3.87–5.11)
RDW: 14.5 % (ref 11.5–15.5)
WBC: 7.3 10*3/uL (ref 4.0–10.5)
nRBC: 0 % (ref 0.0–0.2)

## 2020-11-27 LAB — GLUCOSE, CAPILLARY
Glucose-Capillary: 204 mg/dL — ABNORMAL HIGH (ref 70–99)
Glucose-Capillary: 215 mg/dL — ABNORMAL HIGH (ref 70–99)
Glucose-Capillary: 198 mg/dL — ABNORMAL HIGH (ref 70–99)
Glucose-Capillary: 167 mg/dL — ABNORMAL HIGH (ref 70–99)
Glucose-Capillary: 456 mg/dL — ABNORMAL HIGH (ref 70–99)
Glucose-Capillary: 381 mg/dL — ABNORMAL HIGH (ref 70–99)
Glucose-Capillary: 462 mg/dL — ABNORMAL HIGH (ref 70–99)

## 2020-11-27 LAB — COMPREHENSIVE METABOLIC PANEL
ALT: 162 U/L — ABNORMAL HIGH (ref 0–44)
AST: 82 U/L — ABNORMAL HIGH (ref 15–41)
Albumin: 3.2 g/dL — ABNORMAL LOW (ref 3.5–5.0)
Alkaline Phosphatase: 54 U/L (ref 38–126)
Anion gap: 6 (ref 5–15)
BUN: 12 mg/dL (ref 6–20)
CO2: 27 mmol/L (ref 22–32)
Calcium: 9.1 mg/dL (ref 8.9–10.3)
Chloride: 102 mmol/L (ref 98–111)
Creatinine, Ser: 0.44 mg/dL (ref 0.44–1.00)
GFR, Estimated: >60 mL/min (ref >60)
Glucose, Bld: 263 mg/dL — ABNORMAL HIGH (ref 70–99)
Potassium: 4.5 mmol/L (ref 3.5–5.1)
Sodium: 135 mmol/L (ref 135–145)
Total Bilirubin: 0.4 mg/dL (ref 0.3–1.2)
Total Protein: 6.5 g/dL (ref 6.5–8.1)

## 2020-11-27 LAB — MRSA PCR SCREENING: MRSA by PCR: NEGATIVE

## 2020-11-27 SURGERY — MUSCLE BIOPSY
Anesthesia: General | Laterality: Left

## 2020-11-27 MED ORDER — AMISULPRIDE (ANTIEMETIC) 5 MG/2ML IV SOLN: 10.0000 mg | Freq: Once | INTRAVENOUS | Status: DC | PRN

## 2020-11-27 MED ORDER — DEXAMETHASONE SODIUM PHOSPHATE 10 MG/ML IJ SOLN
INTRAMUSCULAR | Status: DC | PRN
  Administered 2020-11-27: 8 mg via INTRAVENOUS

## 2020-11-27 MED ORDER — GABAPENTIN 300 MG PO CAPS
600.0000 mg | ORAL_CAPSULE | Freq: Two times a day (BID) | ORAL | Status: DC
  Administered 2020-11-27 – 2020-11-28 (×2): 600 mg via ORAL
  Filled 2020-11-27 (×3): qty 2

## 2020-11-27 MED ORDER — FENTANYL CITRATE (PF) 100 MCG/2ML IJ SOLN: 25.0000 ug | INTRAMUSCULAR | Status: DC | PRN

## 2020-11-27 MED ORDER — LIDOCAINE 2% (20 MG/ML) 5 ML SYRINGE
INTRAMUSCULAR | Status: DC | PRN
  Administered 2020-11-27: 100 mg via INTRAVENOUS

## 2020-11-27 MED ORDER — OXYCODONE HCL 5 MG/5ML PO SOLN
5.0000 mg | Freq: Once | ORAL | Status: DC | PRN
Start: 2020-11-27 — End: 2020-11-27

## 2020-11-27 MED ORDER — BUPIVACAINE-EPINEPHRINE (PF) 0.25% -1:200000 IJ SOLN
INTRAMUSCULAR | Status: DC | PRN
  Administered 2020-11-27: 12 mL

## 2020-11-27 MED ORDER — LACTATED RINGERS IV SOLN: INTRAVENOUS | Status: DC

## 2020-11-27 MED ORDER — PROPOFOL 10 MG/ML IV BOLUS
INTRAVENOUS | Status: DC | PRN
  Administered 2020-11-27: 200 mg via INTRAVENOUS

## 2020-11-27 MED ORDER — OXYCODONE-ACETAMINOPHEN 5-325 MG PO TABS
1.0000 | ORAL_TABLET | Freq: Four times a day (QID) | ORAL | Status: DC | PRN
  Administered 2020-11-27: 2 via ORAL
  Administered 2020-11-27: 1 via ORAL
  Administered 2020-11-28 – 2020-12-02 (×14): 2 via ORAL
  Administered 2020-12-02: 1 via ORAL
  Administered 2020-12-03 – 2020-12-04 (×4): 2 via ORAL
  Filled 2020-11-27: qty 2
  Filled 2020-11-27: qty 1
  Filled 2020-11-27 (×19): qty 2

## 2020-11-27 MED ORDER — ONDANSETRON HCL 4 MG/2ML IJ SOLN
INTRAMUSCULAR | Status: DC | PRN
  Administered 2020-11-27: 4 mg via INTRAVENOUS

## 2020-11-27 MED ORDER — PHENYLEPHRINE 40 MCG/ML (10ML) SYRINGE FOR IV PUSH (FOR BLOOD PRESSURE SUPPORT)
PREFILLED_SYRINGE | INTRAVENOUS | Status: DC | PRN
  Administered 2020-11-27: 160 ug via INTRAVENOUS
  Administered 2020-11-27: 120 ug via INTRAVENOUS

## 2020-11-27 MED ORDER — INSULIN ASPART 100 UNIT/ML ~~LOC~~ SOLN
12.0000 [IU] | Freq: Once | SUBCUTANEOUS | Status: AC
  Administered 2020-11-27: 12 [IU] via SUBCUTANEOUS

## 2020-11-27 MED ORDER — ACETAMINOPHEN 500 MG PO TABS
1000.0000 mg | ORAL_TABLET | ORAL | Status: AC
  Administered 2020-11-27: 1000 mg via ORAL
  Filled 2020-11-27: qty 2

## 2020-11-27 MED ORDER — OXYCODONE HCL 5 MG PO TABS
5.0000 mg | ORAL_TABLET | Freq: Once | ORAL | Status: DC | PRN
Start: 2020-11-27 — End: 2020-11-27

## 2020-11-27 MED ORDER — INSULIN ASPART 100 UNIT/ML ~~LOC~~ SOLN
22.0000 [IU] | Freq: Once | SUBCUTANEOUS | Status: AC
  Administered 2020-11-27: 22 [IU] via SUBCUTANEOUS

## 2020-11-27 MED ORDER — BUPIVACAINE-EPINEPHRINE (PF) 0.25% -1:200000 IJ SOLN
INTRAMUSCULAR | Status: AC
  Filled 2020-11-27: qty 30

## 2020-11-27 MED ORDER — ONDANSETRON HCL 4 MG/2ML IJ SOLN: 4.0000 mg | Freq: Once | INTRAMUSCULAR | Status: DC | PRN

## 2020-11-27 MED ORDER — CEFAZOLIN SODIUM-DEXTROSE 2-4 GM/100ML-% IV SOLN
2.0000 g | INTRAVENOUS | Status: AC
  Administered 2020-11-27: 2 g via INTRAVENOUS
  Filled 2020-11-27: qty 100

## 2020-11-27 SURGICAL SUPPLY — 42 items
APL SKNCLS STERI-STRIP NONHPOA (GAUZE/BANDAGES/DRESSINGS)
ATTRACTOMAT 16X20 MAGNETIC DRP (DRAPES) ×1 IMPLANT
BENZOIN TINCTURE PRP APPL 2/3 (GAUZE/BANDAGES/DRESSINGS) ×1 IMPLANT
BLADE HEX COATED 2.75 (ELECTRODE) ×2 IMPLANT
BLADE SURG 15 STRL LF DISP TIS (BLADE) ×2 IMPLANT
BLADE SURG 15 STRL SS (BLADE) ×4
BLADE SURG SZ10 CARB STEEL (BLADE) ×4 IMPLANT
CLIP VESOCCLUDE SM WIDE 6/CT (CLIP) ×6 IMPLANT
COVER SURGICAL LIGHT HANDLE (MISCELLANEOUS) ×1 IMPLANT
COVER WAND RF STERILE (DRAPES) IMPLANT
DISSECTOR ROUND CHERRY 3/8 STR (MISCELLANEOUS) ×2 IMPLANT
DRAPE LAPAROTOMY T 98X78 PEDS (DRAPES) ×2 IMPLANT
ELECT NDL TIP 2.8 STRL (NEEDLE) IMPLANT
ELECT NEEDLE TIP 2.8 STRL (NEEDLE) IMPLANT
ELECT REM PT RETURN 15FT ADLT (MISCELLANEOUS) ×2 IMPLANT
GAUZE 4X4 16PLY RFD (DISPOSABLE) ×2 IMPLANT
GAUZE SPONGE 4X4 12PLY STRL (GAUZE/BANDAGES/DRESSINGS) ×1 IMPLANT
GLOVE SURG ENC MOIS LTX SZ7 (GLOVE) ×2 IMPLANT
GLOVE SURG UNDER POLY LF SZ7 (GLOVE) ×2 IMPLANT
GLOVE SURG UNDER POLY LF SZ7.5 (GLOVE) ×2 IMPLANT
GOWN STRL REUS W/ TWL XL LVL3 (GOWN DISPOSABLE) ×1 IMPLANT
GOWN STRL REUS W/TWL LRG LVL3 (GOWN DISPOSABLE) ×4 IMPLANT
GOWN STRL REUS W/TWL XL LVL3 (GOWN DISPOSABLE) ×1 IMPLANT
HEMOSTAT SURGICEL 2X14 (HEMOSTASIS) ×1 IMPLANT
KIT BASIN OR (CUSTOM PROCEDURE TRAY) ×2 IMPLANT
KIT TURNOVER KIT A (KITS) ×2 IMPLANT
MARKER SKIN DUAL TIP RULER LAB (MISCELLANEOUS) ×2 IMPLANT
NS IRRIG 1000ML POUR BTL (IV SOLUTION) ×2 IMPLANT
PACK BASIC VI WITH GOWN DISP (CUSTOM PROCEDURE TRAY) ×2 IMPLANT
PENCIL SMOKE EVACUATOR (MISCELLANEOUS) IMPLANT
STAPLER VISISTAT 35W (STAPLE) ×1 IMPLANT
STRIP CLOSURE SKIN 1/2X4 (GAUZE/BANDAGES/DRESSINGS) ×2 IMPLANT
SUT SILK 2 0 (SUTURE)
SUT SILK 2-0 18XBRD TIE 12 (SUTURE) ×1 IMPLANT
SUT SILK 3 0 (SUTURE)
SUT SILK 3-0 18XBRD TIE 12 (SUTURE) IMPLANT
SUT VIC AB 3-0 SH 27 (SUTURE) ×2
SUT VIC AB 3-0 SH 27XBRD (SUTURE) IMPLANT
SUT VIC AB 4-0 PS2 27 (SUTURE) ×2 IMPLANT
SYR BULB IRRIG 60ML STRL (SYRINGE) IMPLANT
TOWEL OR 17X26 10 PK STRL BLUE (TOWEL DISPOSABLE) ×2 IMPLANT
WATER STERILE IRR 1000ML POUR (IV SOLUTION) ×2 IMPLANT

## 2020-11-27 NOTE — Op Note (Signed)
Preoperative diagnosis: muscle weakness Postoperative diagnosis: saa Procedure: left thigh muscle biopsy Surgeon: Dr Serita Grammes  Anesthesia general EBL minimal Complications none Specimens 3 2.5x 0.5 cm strips of left thigh muscle Sponge and needle count correct dispo recovery stable  Indications; 43 yof with longstanding muscle weakness. Request for muscle biopsy from medicine and neurology for diagnosis.  She agreed to one site and we elected to proceed with left thigh biopsy  Procedure: After informed consent was obtained the patient was taken the operating.  She received antibiotics.  SCDs were in place.  She was placed under general anesthesia without complication.  She was prepped and draped in the standard sterile surgical fashion.  Surgical timeout was then performed.  I filtrated Marcaine with epinephrine over her left thigh.  I then made an incision.  I carried this down to level of fascia.  I incised the fascia.  I then removed three 2-1/2 x 0.5 cm strips of muscle and sent these specimens.  Hemostasis was then obtained.  I closed the fascia with 2-0 Vicryl.  The subcutaneous tissue was closed with 3-0 Vicryl the skin was closed with 4 Monocryl and glue.  She tolerated this well was extubated and transferred to recovery stable.

## 2020-11-27 NOTE — Progress Notes (Signed)
[facility] PROGRESS NOTE   Kristine Bauer QMG:[id number] DOB: 08/28/1966 DOA: 11/24/2020  PCP: Nicolette Bang, DO  Brief narrative 55 y.o. female, with PMH of myositis, insulin-dependent type 2 diabetes, hypertension, diastolic CHF  presented to the hospital with complaints of difficulty getting up from the toilet due to bilateral lower extremity weakness.  Patient had been having bilateral lower extremity weakness for around 2 years and was seen by neurology as outpatient.  She was offered muscle biopsy but she had declined biopsy as outpatient. Patient presented  this time and  she wished to proceed with biopsy.    Assessment/Plan:  Bilateral lower extremity weakness with a history of myositis History of myositis in the past but no biopsy was done.  MRI of the thoracic and lumbar spine without any significant findings.  Seen by neurology.  Recommended biopsy at this time.  Patient is to go muscle biopsy today.  Continue gabapentin for neuropathic pain.  Seen by physical therapy, occupational therapy who recommended rehabilitation as outpatient.  Patient had EMG as outpatient.  CK is elevated.  Apical chest pain Troponins negative today EKG unremarkable.  Possible acid reflux.  Continue twice a day PPI.  Patient has atypical symptoms.  EKG done yesterday did not show any acute findings.  Troponins were normal.  Symptoms have resolved.  Could be due to acid reflux.    Sinus tachycardia most likely due to anxiety.  Likely chronic in nature.    Insulin-dependent diabetes mellitus type 2, poorly controlled Latest hemoglobin A1c of 12.5.  On Lantus Metformin sulfonylurea at home.  Continue sliding-scale insulin, Accu-Cheks, diabetic diet, Lantus while in the hospital  Essential hypertension Continue amlodipine, hydrochlorothiazide and Diovan   Elevated LFTs. Negative hepatitis panel.  Could be secondary to myositis.  Normocytic anemia Hemoglobin of 11.1.  No  evidence of bleeding.  Morbid obesity Would benefit from weight loss as outpatient.  DVT Prophylaxis: Lovenox subcu  Code Status: Full code  Family Communication:  Aunt at bedside.  Status is: Inpatient  Remains inpatient appropriate because:Unsafe d/c plan and Inpatient level of care appropriate due to severity of illness, further diagnostic testing,   Dispo: The patient is from: Home              Anticipated d/c is to: To be determined, likely to skilled nursing facility              Patient currently is not medically stable to d/c.   Difficult to place patient No  Consultants:  Neurology General surgery  Procedures:  None yet  Antibiotics: Anti-infectives (From admission, onward)   Start     Dose/Rate Route Frequency Ordered Stop   11/27/20 0600  ceFAZolin (ANCEF) IVPB 2g/100 mL premix        2 g 200 mL/hr over 30 Minutes Intravenous On call to O.R. 11/27/20 0508 11/27/20 1011      Medications:  Scheduled: . [MAR Hold] amLODipine  5 mg Oral Daily  . [MAR Hold] enoxaparin (LOVENOX) injection  40 mg Subcutaneous Q24H  . [MAR Hold] gabapentin  600 mg Oral BID  . [MAR Hold] insulin aspart  0-15 Units Subcutaneous TID WC  . [MAR Hold] insulin aspart  0-5 Units Subcutaneous QHS  . [MAR Hold] insulin glargine  28 Units Subcutaneous QHS  . [MAR Hold] irbesartan  150 mg Oral Daily  . [MAR Hold] montelukast  10 mg Oral QHS  . [MAR Hold] oxymetazoline  1 spray Each Nare BID  . [  MAR Hold] pantoprazole  40 mg Oral Daily   Continuous: . lactated ringers 50 mL/hr at 11/27/20 1011   PRN:[MAR Hold] acetaminophen **OR** [MAR Hold] acetaminophen, [MAR Hold] albuterol, [MAR Hold] ALPRAZolam, amisulpride, bupivacaine-epinephrine, fentaNYL (SUBLIMAZE) injection, [MAR Hold] guaiFENesin, [MAR Hold] ondansetron **OR** [MAR Hold] ondansetron (ZOFRAN) IV, ondansetron (ZOFRAN) IV, oxyCODONE **OR** oxyCODONE, [MAR Hold] polyethylene glycol, [MAR Hold] sodium  chloride   Subjective: Today, patient was seen and examined at bedside.  Complains of weakness on the legs.  Denies any fever chills or rigor.  Awaiting for muscle biopsy.  Objective:  Vital Signs  Vitals:   11/27/20 0636 11/27/20 0903 11/27/20 1100 11/27/20 1115  BP: (!) 145/80 (!) 168/91 120/90 138/81  Pulse: (!) 106 98 (!) 103 (!) 103  Resp: 18 16 18 18   Temp: 98.1 F (36.7 C) 98.1 F (36.7 C) 98.4 F (36.9 C)   TempSrc: Oral Oral    SpO2: 100% 99% 99% 97%    Intake/Output Summary (Last 24 hours) at 11/27/2020 1133 Last data filed at 11/27/2020 0600 Gross per 24 hour  Intake 600 ml  Output 8 ml  Net 592 ml   Filed Weights   Physical exam General: Obese built, not in obvious distress HENT:   No scleral pallor or icterus noted. Oral mucosa is moist.  Chest:  Clear breath sounds.  Diminished breath sounds bilaterally. No crackles or wheezes.  CVS: S1 &S2 heard. No murmur.  Regular rate and rhythm. Abdomen: Soft, nontender, nondistended.  Bowel sounds are heard.   Extremities: No cyanosis, clubbing or edema.  Peripheral pulses are palpable. Psych: Alert, awake and oriented, normal mood CNS:  No cranial nerve deficits.  Generalized weakness of the lower extremities Skin: Warm and dry.  No rashes noted.   Lab Results:  Data Reviewed: I have personally reviewed the following labs and imaging studies  CBC: Recent Labs  Lab 11/24/20 2052 11/25/20 0653 11/26/20 0506 11/27/20 0447  WBC 6.9 7.6 7.2 7.3  NEUTROABS 4.3  --   --   --   HGB 11.1* 10.4* 10.5* 11.1*  HCT 36.5 33.9* 35.1* 36.5  MCV 82.0 81.3 83.2 81.8  PLT 327 317 292 937    Basic Metabolic Panel: Recent Labs  Lab 11/24/20 2052 11/25/20 0100 11/25/20 0653 11/26/20 0506 11/27/20 0447  NA 134*  --  135 136 135  K 4.5  --  3.6 4.2 4.5  CL 97*  --  101 105 102  CO2 26  --  25 24 27   GLUCOSE 360*  --  301* 196* 263*  BUN 21*  --  15 12 12   CREATININE 0.70  --  0.53 0.39* 0.44  CALCIUM 9.5  --   8.7* 8.9 9.1  MG  --  2.0  --   --   --   PHOS  --  4.4  --   --   --     GFR: CrCl cannot be calculated (Unknown ideal weight.).  Liver Function Tests: Recent Labs  Lab 11/24/20 2052 11/25/20 0653 11/26/20 0506 11/27/20 0447  AST 114* 91* 84* 82*  ALT 232* 184* 167* 162*  ALKPHOS 67 58 53 54  BILITOT 0.6 0.5 0.6 0.4  PROT 7.4 6.5 6.3* 6.5  ALBUMIN 3.8 3.3* 3.2* 3.2*     Cardiac Enzymes: Recent Labs  Lab 11/24/20 2052  CKTOTAL 7,517*     HbA1C: Recent Labs    11/25/20 0653  HGBA1C 12.5*    CBG: Recent Labs  Lab 11/26/20 1635  11/26/20 2137 11/27/20 0754 11/27/20 0901 11/27/20 1106  GLUCAP 341* 349* 204* 215* 198*     Recent Results (from the past 240 hour(s))  Resp Panel by RT-PCR (Flu A&B, Covid) Nasopharyngeal Swab     Status: None   Collection Time: 11/24/20 11:30 PM   Specimen: Nasopharyngeal Swab; Nasopharyngeal(NP) swabs in vial transport medium  Result Value Ref Range Status   SARS Coronavirus 2 by RT PCR NEGATIVE NEGATIVE Final    Comment: (NOTE) SARS-CoV-2 target nucleic acids are NOT DETECTED.  The SARS-CoV-2 RNA is generally detectable in upper respiratory specimens during the acute phase of infection. The lowest concentration of SARS-CoV-2 viral copies this assay can detect is 138 copies/mL. A negative result does not preclude SARS-Cov-2 infection and should not be used as the sole basis for treatment or other patient management decisions. A negative result may occur with  improper specimen collection/handling, submission of specimen other than nasopharyngeal swab, presence of viral mutation(s) within the areas targeted by this assay, and inadequate number of viral copies(<138 copies/mL). A negative result must be combined with clinical observations, patient history, and epidemiological information. The expected result is Negative.  Fact Sheet for Patients:  EntrepreneurPulse.com.au  Fact Sheet for Healthcare  Providers:  IncredibleEmployment.be  This test is no t yet approved or cleared by the [place] FDA and  has been authorized for detection and/or diagnosis of SARS-CoV-2 by FDA under an Emergency Use Authorization (EUA). This EUA will remain  in effect (meaning this test can be used) for the duration of the COVID-19 declaration under Section 564(b)(1) of the Act, 21 U.S.C.section 360bbb-3(b)(1), unless the authorization is terminated  or revoked sooner.       Influenza A by PCR NEGATIVE NEGATIVE Final   Influenza B by PCR NEGATIVE NEGATIVE Final    Comment: (NOTE) The Xpert Xpress SARS-CoV-2/FLU/RSV plus assay is intended as an aid in the diagnosis of influenza from Nasopharyngeal swab specimens and should not be used as a sole basis for treatment. Nasal washings and aspirates are unacceptable for Xpert Xpress SARS-CoV-2/FLU/RSV testing.  Fact Sheet for Patients: EntrepreneurPulse.com.au  Fact Sheet for Healthcare Providers: IncredibleEmployment.be  This test is not yet approved or cleared by the [place] FDA and has been authorized for detection and/or diagnosis of SARS-CoV-2 by FDA under an Emergency Use Authorization (EUA). This EUA will remain in effect (meaning this test can be used) for the duration of the COVID-19 declaration under Section 564(b)(1) of the Act, 21 U.S.C. section 360bbb-3(b)(1), unless the authorization is terminated or revoked.  Performed at [facility], [address] [place], [place] [address]   MRSA PCR Screening     Status: None   Collection Time: 11/27/20  5:23 AM   Specimen: Nasal Mucosa; Nasopharyngeal  Result Value Ref Range Status   MRSA by PCR NEGATIVE NEGATIVE Final    Comment:        The GeneXpert MRSA Assay (FDA approved for NASAL specimens only), is one component of a comprehensive MRSA colonization surveillance program. It is not intended to  diagnose MRSA infection nor to guide or monitor treatment for MRSA infections. Performed at [facility], [place] [address], [place], [place] [address]       Radiology Studies: DG CHEST PORT 1 VIEW  Result Date: 11/26/2020 CLINICAL DATA:  Midline chest pain. EXAM: PORTABLE CHEST 1 VIEW COMPARISON:  02/09/2020. FINDINGS: Mediastinum hilar structures normal. Heart size normal. Low lung volumes. No focal infiltrate. No pleural effusion or  pneumothorax. Thoracic spine scoliosis and degenerative change. IMPRESSION: Low lung volumes. No acute cardiopulmonary disease. Electronically Signed   By: Marcello Moores  Register   On: 11/26/2020 12:16      LOS: 2 days   Fifi Schindler,MD [facility] Pager on [email or web address]  11/27/2020, 11:33 AM

## 2020-11-27 NOTE — Progress Notes (Signed)
Subjective/Chief Complaint: No complaints, nervous   Objective: Vital signs in last 24 hours: Temp:  [98 F (36.7 C)-98.5 F (36.9 C)] 98.1 F (36.7 C) (03/23 0636) Pulse Rate:  [101-108] 106 (03/23 0636) Resp:  [16-18] 18 (03/23 0636) BP: (137-156)/(65-80) 145/80 (03/23 0636) SpO2:  [97 %-100 %] 100 % (03/23 0636) Last BM Date: 11/23/20  Intake/Output from previous day: 03/22 0701 - 03/23 0700 In: 600 [P.O.:600] Out: 8 [Urine:4; Stool:4] Intake/Output this shift: No intake/output data recorded.    Lab Results:  Recent Labs    11/26/20 0506 11/27/20 0447  WBC 7.2 7.3  HGB 10.5* 11.1*  HCT 35.1* 36.5  PLT 292 314   BMET Recent Labs    11/26/20 0506 11/27/20 0447  NA 136 135  K 4.2 4.5  CL 105 102  CO2 24 27  GLUCOSE 196* 263*  BUN 12 12  CREATININE 0.39* 0.44  CALCIUM 8.9 9.1   PT/INR No results for input(s): LABPROT, INR in the last 72 hours. ABG No results for input(s): PHART, HCO3 in the last 72 hours.  Invalid input(s): PCO2, PO2  Studies/Results: MR THORACIC SPINE WO CONTRAST  Result Date: 11/25/2020 CLINICAL DATA:  Progressive back pain and neurologic deficit. History of neuropathy and myopathy. Bilateral lower extremity weakness. Unable to ambulate. EXAM: MRI THORACIC AND LUMBAR SPINE WITHOUT CONTRAST TECHNIQUE: Multiplanar and multiecho pulse sequences of the thoracic and lumbar spine were obtained without intravenous contrast. COMPARISON:  None. FINDINGS: MRI THORACIC SPINE FINDINGS Alignment:  Normal Vertebrae: Normal marrow signal.  No bone lesions or fractures. Cord:  Normal cord signal intensity.  No cord lesions or syrinx. Paraspinal and other soft tissues: No significant paraspinal findings. No obvious mediastinal mass or lung lesions. No pleural effusions. Disc levels: Mild multilevel degenerative disc disease with disc space narrowing and osteophytic spurring. No large disc protrusions or significant spinal stenosis. Multilevel shallow  disc osteophyte complexes are noted without significant mass effect on the thecal sac or spinal cord. Shallow right-sided disc osteophyte complex at T2-3 with mild impression on the right side of the thecal sac. Degenerative disc disease at T7-8 with shallow bilateral disc osteophyte complexes and mild impression on both sides of the thecal sac. Right-sided disc osteophyte complex at T8-9. Bilateral disc osteophyte complexes at T9-10. Small central disc protrusion at T11-12. MRI LUMBAR SPINE FINDINGS Segmentation: There are five lumbar type vertebral bodies. The last full intervertebral disc space is labeled L5-S1. This correlates with the lumbar spine numbering. Alignment:  Normal Vertebrae:  Normal marrow signal.  No bone lesions or fractures. Conus medullaris and cauda equina: Conus extends to the T12-L1 level. Conus and cauda equina appear normal. Paraspinal and other soft tissues: No significant paraspinal or retroperitoneal findings. Disc levels: The spinal canal is quite generous. No significant spinal or foraminal stenosis. L1-2: No significant findings. L2-3: Mild annular bulge with slight impression on both sides of the thecal sac and mild lateral recess encroachment. No significant spinal or foraminal stenosis. L3-4: Mild bulging annulus and mild facet disease with mild bilateral lateral recess encroachment. No significant spinal or foraminal stenosis. L4-5: Moderate facet disease but no disc protrusions, spinal or foraminal stenosis. L5-S1: Shallow broad-based right paracentral and medial foraminal disc protrusion. There is slight flattening of the ventral thecal sac and potential irritation of the right S1 nerve root. The exiting L5 nerve roots appear normal. IMPRESSION: 1. Mild multilevel degenerative thoracic spondylosis with disc space narrowing and osteophytic spurring. Multilevel shallow disc osteophyte complexes bilaterally with mild  impression on the thecal sac. No significant spinal stenosis. 2.  Small central disc protrusion at T11-12. 3. Shallow broad-based right paracentral and medial foraminal disc protrusion at L5-S1. No significant neural compression but potential irritation of the right S1 nerve root. 4. Mild bilateral lateral recess encroachment at L2-3 and L3-4. 5. Unremarkable MR appearance of the thoracic spinal cord. No cord lesions or syrinx is identified. Electronically Signed   By: Marijo Sanes M.D.   On: 11/25/2020 08:57   MR LUMBAR SPINE WO CONTRAST  Result Date: 11/25/2020 CLINICAL DATA:  Progressive back pain and neurologic deficit. History of neuropathy and myopathy. Bilateral lower extremity weakness. Unable to ambulate. EXAM: MRI THORACIC AND LUMBAR SPINE WITHOUT CONTRAST TECHNIQUE: Multiplanar and multiecho pulse sequences of the thoracic and lumbar spine were obtained without intravenous contrast. COMPARISON:  None. FINDINGS: MRI THORACIC SPINE FINDINGS Alignment:  Normal Vertebrae: Normal marrow signal.  No bone lesions or fractures. Cord:  Normal cord signal intensity.  No cord lesions or syrinx. Paraspinal and other soft tissues: No significant paraspinal findings. No obvious mediastinal mass or lung lesions. No pleural effusions. Disc levels: Mild multilevel degenerative disc disease with disc space narrowing and osteophytic spurring. No large disc protrusions or significant spinal stenosis. Multilevel shallow disc osteophyte complexes are noted without significant mass effect on the thecal sac or spinal cord. Shallow right-sided disc osteophyte complex at T2-3 with mild impression on the right side of the thecal sac. Degenerative disc disease at T7-8 with shallow bilateral disc osteophyte complexes and mild impression on both sides of the thecal sac. Right-sided disc osteophyte complex at T8-9. Bilateral disc osteophyte complexes at T9-10. Small central disc protrusion at T11-12. MRI LUMBAR SPINE FINDINGS Segmentation: There are five lumbar type vertebral bodies. The last  full intervertebral disc space is labeled L5-S1. This correlates with the lumbar spine numbering. Alignment:  Normal Vertebrae:  Normal marrow signal.  No bone lesions or fractures. Conus medullaris and cauda equina: Conus extends to the T12-L1 level. Conus and cauda equina appear normal. Paraspinal and other soft tissues: No significant paraspinal or retroperitoneal findings. Disc levels: The spinal canal is quite generous. No significant spinal or foraminal stenosis. L1-2: No significant findings. L2-3: Mild annular bulge with slight impression on both sides of the thecal sac and mild lateral recess encroachment. No significant spinal or foraminal stenosis. L3-4: Mild bulging annulus and mild facet disease with mild bilateral lateral recess encroachment. No significant spinal or foraminal stenosis. L4-5: Moderate facet disease but no disc protrusions, spinal or foraminal stenosis. L5-S1: Shallow broad-based right paracentral and medial foraminal disc protrusion. There is slight flattening of the ventral thecal sac and potential irritation of the right S1 nerve root. The exiting L5 nerve roots appear normal. IMPRESSION: 1. Mild multilevel degenerative thoracic spondylosis with disc space narrowing and osteophytic spurring. Multilevel shallow disc osteophyte complexes bilaterally with mild impression on the thecal sac. No significant spinal stenosis. 2. Small central disc protrusion at T11-12. 3. Shallow broad-based right paracentral and medial foraminal disc protrusion at L5-S1. No significant neural compression but potential irritation of the right S1 nerve root. 4. Mild bilateral lateral recess encroachment at L2-3 and L3-4. 5. Unremarkable MR appearance of the thoracic spinal cord. No cord lesions or syrinx is identified. Electronically Signed   By: Marijo Sanes M.D.   On: 11/25/2020 08:57   DG CHEST PORT 1 VIEW  Result Date: 11/26/2020 CLINICAL DATA:  Midline chest pain. EXAM: PORTABLE CHEST 1 VIEW  COMPARISON:  02/09/2020. FINDINGS: Mediastinum  hilar structures normal. Heart size normal. Low lung volumes. No focal infiltrate. No pleural effusion or pneumothorax. Thoracic spine scoliosis and degenerative change. IMPRESSION: Low lung volumes. No acute cardiopulmonary disease. Electronically Signed   By: Marcello Moores  Register   On: 11/26/2020 12:16    Anti-infectives: Anti-infectives (From admission, onward)   Start     Dose/Rate Route Frequency Ordered Stop   11/27/20 0600  ceFAZolin (ANCEF) IVPB 2g/100 mL premix        2 g 200 mL/hr over 30 Minutes Intravenous On call to O.R. 11/27/20 0508 11/28/20 0559      Assessment/Plan: Left thigh muscle biopsy today  Rolm Bookbinder 11/27/2020

## 2020-11-27 NOTE — TOC Progression Note (Signed)
Transition of Care [facility]) - Progression Note   Patient Details  Name: Kristine Bauer MRN: [id number] Date of Birth: 1966-06-20  Transition of Care [facility]) CM/SW [place], LCSW Phone Number: 11/27/2020, 2:08 PM  Clinical Narrative: CSW met with patient and her aunt, Kristine Bauer, to review bed offers. 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 Patient asked about SNFs in [place]. CSW reviewed SNFs in [place] and [place] is closed and [facility] is not in-network with [employer]; the other facilities are ALFs or independent living facilities.  Patient agreeable to bed at [facility]. CSW called [facility] and was directed to Carollee Leitz to confirm patient will not have copays for SNF. CSW left VM for Ms. Mel Almond requesting call back.  Expected Discharge Plan: [place] Barriers to Discharge: Continued Medical Work up  Expected Discharge Plan and Services Expected Discharge Plan: [place] In-house Referral: Clinical Social Work Post Acute Care Choice: [place] Living arrangements for the past 2 months: Apartment  Readmission Risk Interventions No flowsheet data found.

## 2020-11-27 NOTE — Anesthesia Preprocedure Evaluation (Signed)
Anesthesia Evaluation  Patient identified by MRN, date of birth, ID band Patient awake    Reviewed: Allergy & Precautions, NPO status , Patient's Chart, lab work & pertinent test results  History of Anesthesia Complications Negative for: history of anesthetic complications  Airway Mallampati: II  TM Distance: >3 FB Neck ROM: Full    Dental  (+) Teeth Intact   Pulmonary asthma ,    Pulmonary exam normal        Cardiovascular hypertension, +CHF  Normal cardiovascular exam     Neuro/Psych  Neuromuscular disease (myositis)    GI/Hepatic Neg liver ROS, GERD  ,  Endo/Other  negative endocrine ROSdiabetes, Type 2, Insulin Dependent  Renal/GU negative Renal ROS  negative genitourinary   Musculoskeletal negative musculoskeletal ROS (+)   Abdominal   Peds  Hematology  (+) anemia ,   Anesthesia Other Findings   Reproductive/Obstetrics                             Anesthesia Physical Anesthesia Plan  ASA: III  Anesthesia Plan: General   Post-op Pain Management:    Induction: Intravenous  PONV Risk Score and Plan: 3 and Ondansetron, Dexamethasone, Midazolam and Treatment may vary due to age or medical condition  Airway Management Planned: LMA  Additional Equipment: None  Intra-op Plan:   Post-operative Plan: Extubation in OR  Informed Consent: I have reviewed the patients History and Physical, chart, labs and discussed the procedure including the risks, benefits and alternatives for the proposed anesthesia with the patient or authorized representative who has indicated his/her understanding and acceptance.     Dental advisory given  Plan Discussed with:   Anesthesia Plan Comments:         Anesthesia Quick Evaluation

## 2020-11-27 NOTE — Anesthesia Procedure Notes (Signed)
Procedure Name: LMA Insertion Performed by: Rosaland Lao, CRNA Pre-anesthesia Checklist: Patient identified, Emergency Drugs available, Suction available and Patient being monitored Patient Re-evaluated:Patient Re-evaluated prior to induction Oxygen Delivery Method: Circle system utilized Preoxygenation: Pre-oxygenation with 100% oxygen Induction Type: IV induction LMA: LMA with gastric port inserted LMA Size: 4.0 Tube type: Oral Number of attempts: 1 Placement Confirmation: positive ETCO2 and breath sounds checked- equal and bilateral Tube secured with: Tape Dental Injury: Teeth and Oropharynx as per pre-operative assessment

## 2020-11-27 NOTE — Progress Notes (Signed)
PT Cancellation Note  Patient Details Name: Kristine Bauer MRN: [id number] DOB: Sep 30, 1965   Cancelled Treatment:    Reason Eval/Treat Not Completed: Pain limiting ability to participate;Fatigue/lethargy limiting ability to participate (pt reported her feet are in terrible pain and she's tired from just getting up to use the bathroom, she declined PT. Will check back tomorrow.)   [name] PT 11/27/2020  Acute Rehabilitation Services Pager [phone] Office [phone]

## 2020-11-27 NOTE — Anesthesia Postprocedure Evaluation (Signed)
Anesthesia Post Note  Patient: Kristine Bauer  Procedure(s) Performed: MUSCLE BIOPSY RIGHT THIGH (Left )     Patient location during evaluation: PACU Anesthesia Type: General Level of consciousness: awake and alert Pain management: pain level controlled Vital Signs Assessment: post-procedure vital signs reviewed and stable Respiratory status: spontaneous breathing, nonlabored ventilation and respiratory function stable Cardiovascular status: blood pressure returned to baseline and stable Postop Assessment: no apparent nausea or vomiting Anesthetic complications: no   No complications documented.  Last Vitals:  Vitals:   11/27/20 1115 11/27/20 1148  BP: 138/81 (!) 152/82  Pulse: (!) 103 97  Resp: 18 18  Temp:  36.8 C  SpO2: 97% 100%    Last Pain:  Vitals:   11/27/20 1148  TempSrc: Oral  PainSc:                  Lidia Collum

## 2020-11-27 NOTE — Progress Notes (Signed)
Follow up on prior note from 3/21  Received response from Dr. Posey Pronto. She agrees that biopsy would guide treatment. No need for steroids as no indication of ongoing inflammatory process.  RECS: -For now, biopsy, followed by rehabilitation is the recommendation.  -Appreciate General Surgery consultation for biopsy. -Follow up with Dr. Posey Pronto, [facility] on discharge.  Please call inpatient neurology service with questions.  -- Amie Portland, MD Neurologist [facility]

## 2020-11-27 NOTE — Discharge Instructions (Signed)
Incision Care, Adult An incision is a cut that a doctor makes in your skin for surgery. Most times, these cuts are closed after surgery. Your cut from surgery may be closed with:  Stitches (sutures).  Staples.  Skin glue.  Skin tape (adhesive) strips. You may need to return to your doctor to have stitches or staples taken out. This may happen many days or many weeks after your surgery. You need to take good care of your cut so it does not get infected. Follow instructions from your doctor about how to care for your cut. Supplies needed:  Soap and water.  A clean hand towel.  Wound cleanser.  A clean bandage (dressing), if needed.  Cream or ointment, if told by your doctor.  Clean gauze. How to care for your cut from surgery Cleaning your cut Ask your doctor how to clean your cut. You may need to:  Use mild soap and water, or a wound cleanser.  Use a clean gauze to pat your cut dry after you clean it. Changing your bandage  Wash your hands with soap and water for at least 20 seconds before and after you change your bandage. If you cannot use soap and water, use hand sanitizer.  Change your bandage as told by your doctor.  Leave stitches, staples, skin glue, or skin tape strips in place. They may need to stay in place for 2 weeks or longer. If tape strips get loose and curl up, you may trim the loose edges. Do not remove tape strips completely unless your doctor says it is okay.  Put a cream or ointment on your cut. Do this only as told.  Cover your cut with a clean bandage.  Ask your doctor when you can leave your cut uncovered. Checking for infection Check your cut area every day for signs of infection. Check for:  More redness, swelling, or pain.  More fluid or blood.  Warmth.  Pus or a bad smell.   Follow these instructions at home Medicines  Take over-the-counter and prescription medicines only as told by your doctor.  If you were prescribed an antibiotic  medicine, cream, or ointment, use it as told by your doctor. Do not stop using the antibiotic even if your condition improves. Eating and drinking  Eat foods that have a lot of certain nutrients, such as protein, vitamin A, and vitamin C. These foods help your cut heal. ? Foods rich in protein include meat, fish, eggs, dairy, beans, and nuts. ? Foods rich in vitamin A include carrots and dark green, leafy vegetables. ? Foods rich in vitamin C include citrus fruits, tomatoes, broccoli, and peppers.  Drink enough fluid to keep your pee (urine) pale yellow. General instructions  Do not take baths, swim, use a hot tub, or put your cut underwater until your doctor approves. Ask your doctor if you may take showers. You may only be allowed to take sponge baths.  Limit movement around your cut. This helps with healing. ? Try not to strain, lift, or exercise for the first 2 weeks, or for as long as told by your doctor. ? Return to your normal activities as told by your doctor. Ask your doctor what activities are safe for you.  Do not scratch or pick at your cut. Keep it covered as told by your doctor.  Protect your cut from the sun when you are outside for the first 6 months, or for as long as told by your doctor. Cover up  the scar area or put on sunscreen that has an SPF of at least 59.  Do not use any products that contain nicotine or tobacco, such as cigarettes, e-cigarettes, and chewing tobacco. These can delay cut healing after surgery. If you need help quitting, ask your doctor.  Keep all follow-up visits as told by your doctor. This is important.   Contact a doctor if:  You have any of these signs of infection around your cut: ? More redness, swelling, or pain. ? More fluid or blood. ? Warmth. ? Pus or a bad smell.  You have a fever.  You feel like you may vomit (nauseous).  You vomit.  You are dizzy.  Your stitches, staples, skin glue, or tape strips come undone. Get help  right away if:  Your cut has a red streak coming from it.  Your cut bleeds through your bandage, and bleeding does not stop with gentle pressure.  Your cut opens up and comes apart.  Your body reacts very badly to an infection. This may include: ? A fever, chills, or feeling cold. ? Feeling mixed up, worried, or nervous. ? Very bad pain. ? Trouble breathing. ? A fast heartbeat. ? Clammy or sweaty skin. ? A rash. These symptoms may be an emergency. Do not wait to see if the symptoms will go away. Get medical help right away. Call your local emergency services (911 in the [place] Do not drive yourself to the hospital. Summary  Follow instructions from your doctor about how to care for your cut from surgery.  Wash your hands with soap and water for at least 20 seconds before and after you change your bandage. If you cannot use soap and water, use hand sanitizer.  Check your cut area every day for signs of infection.  Keep all follow-up visits as told by your doctor. This is important. This information is not intended to replace advice given to you by your health care provider. Make sure you discuss any questions you have with your health care provider. Document Revised: 06/14/2019 Document Reviewed: 06/14/2019 Elsevier Patient Education  [place].

## 2020-11-27 NOTE — Progress Notes (Signed)
PT Cancellation Note  Patient Details Name: Kristine Bauer MRN: [id number] DOB: 1966-05-31   Cancelled Treatment:    Reason Eval/Treat Not Completed: Patient at procedure or test/unavailable. Will follow.    Blondell Reveal Kistler PT 11/27/2020  Acute Rehabilitation Services Pager [phone] Office [phone]

## 2020-11-27 NOTE — Transfer of Care (Signed)
Immediate Anesthesia Transfer of Care Note  Patient: Kristine Bauer  Procedure(s) Performed: MUSCLE BIOPSY RIGHT THIGH (Left )  Patient Location: PACU  Anesthesia Type:General  Level of Consciousness: awake, alert  and oriented  Airway & Oxygen Therapy: Patient Spontanous Breathing and Patient connected to face mask  Post-op Assessment: Report given to RN and Post -op Vital signs reviewed and stable  Post vital signs: Reviewed and stable  Last Vitals:  Vitals Value Taken Time  BP 120/90 11/27/20 1101  Temp    Pulse 104 11/27/20 1104  Resp 23 11/27/20 1104  SpO2 100 % 11/27/20 1104  Vitals shown include unvalidated device data.  Last Pain:  Vitals:   11/27/20 0903  TempSrc: Oral  PainSc:       Patients Stated Pain Goal: 1 (34/03/52 4818)  Complications: No complications documented.

## 2020-11-28 ENCOUNTER — Encounter (HOSPITAL_COMMUNITY): Payer: Self-pay | Admitting: General Surgery

## 2020-11-28 DIAGNOSIS — M609 Myositis, unspecified: Secondary | ICD-10-CM | POA: Diagnosis not present

## 2020-11-28 DIAGNOSIS — E1169 Type 2 diabetes mellitus with other specified complication: Secondary | ICD-10-CM | POA: Diagnosis not present

## 2020-11-28 DIAGNOSIS — Z794 Long term (current) use of insulin: Secondary | ICD-10-CM | POA: Diagnosis not present

## 2020-11-28 DIAGNOSIS — I10 Essential (primary) hypertension: Secondary | ICD-10-CM | POA: Diagnosis not present

## 2020-11-28 LAB — CBC
HCT: 33.2 % — ABNORMAL LOW (ref 36.0–46.0)
Hemoglobin: 10.2 g/dL — ABNORMAL LOW (ref 12.0–15.0)
MCH: 24.8 pg — ABNORMAL LOW (ref 26.0–34.0)
MCHC: 30.7 g/dL (ref 30.0–36.0)
MCV: 80.6 fL (ref 80.0–100.0)
Platelets: 323 10*3/uL (ref 150–400)
RBC: 4.12 MIL/uL (ref 3.87–5.11)
RDW: 14.4 % (ref 11.5–15.5)
WBC: 12.8 10*3/uL — ABNORMAL HIGH (ref 4.0–10.5)
nRBC: 0 % (ref 0.0–0.2)

## 2020-11-28 LAB — COMPREHENSIVE METABOLIC PANEL
ALT: 144 U/L — ABNORMAL HIGH (ref 0–44)
AST: 74 U/L — ABNORMAL HIGH (ref 15–41)
Albumin: 3.2 g/dL — ABNORMAL LOW (ref 3.5–5.0)
Alkaline Phosphatase: 52 U/L (ref 38–126)
Anion gap: 8 (ref 5–15)
BUN: 20 mg/dL (ref 6–20)
CO2: 24 mmol/L (ref 22–32)
Calcium: 8.9 mg/dL (ref 8.9–10.3)
Chloride: 101 mmol/L (ref 98–111)
Creatinine, Ser: 0.44 mg/dL (ref 0.44–1.00)
GFR, Estimated: >60 mL/min (ref >60)
Glucose, Bld: 339 mg/dL — ABNORMAL HIGH (ref 70–99)
Potassium: 4 mmol/L (ref 3.5–5.1)
Sodium: 133 mmol/L — ABNORMAL LOW (ref 135–145)
Total Bilirubin: 0.5 mg/dL (ref 0.3–1.2)
Total Protein: 6.6 g/dL (ref 6.5–8.1)

## 2020-11-28 LAB — GLUCOSE, CAPILLARY
Glucose-Capillary: 260 mg/dL — ABNORMAL HIGH (ref 70–99)
Glucose-Capillary: 280 mg/dL — ABNORMAL HIGH (ref 70–99)
Glucose-Capillary: 301 mg/dL — ABNORMAL HIGH (ref 70–99)
Glucose-Capillary: 330 mg/dL — ABNORMAL HIGH (ref 70–99)

## 2020-11-28 LAB — MAGNESIUM: Magnesium: 1.9 mg/dL (ref 1.7–2.4)

## 2020-11-28 LAB — PHOSPHORUS: Phosphorus: 3.4 mg/dL (ref 2.5–4.6)

## 2020-11-28 MED ORDER — SIMETHICONE 80 MG PO CHEW
80.0000 mg | CHEWABLE_TABLET | Freq: Four times a day (QID) | ORAL | Status: DC | PRN
  Administered 2020-11-28 – 2020-12-04 (×6): 80 mg via ORAL
  Filled 2020-11-28 (×6): qty 1

## 2020-11-28 MED ORDER — POLYETHYLENE GLYCOL 3350 17 G PO PACK
17.0000 g | PACK | Freq: Every day | ORAL | Status: DC
  Administered 2020-11-28 – 2020-12-03 (×6): 17 g via ORAL
  Filled 2020-11-28 (×8): qty 1

## 2020-11-28 MED ORDER — GABAPENTIN 300 MG PO CAPS
600.0000 mg | ORAL_CAPSULE | Freq: Three times a day (TID) | ORAL | Status: DC
  Administered 2020-11-28 – 2020-12-01 (×9): 600 mg via ORAL
  Filled 2020-11-28 (×9): qty 2

## 2020-11-28 NOTE — TOC Progression Note (Signed)
Transition of Care [facility]) - Progression Note   Patient Details  Name: Karime Scheuermann MRN: [id number] Date of Birth: [birth year]  Transition of Care [facility]) CM/SW [place], LCSW Phone Number: 11/28/2020, 4:03 PM  Clinical Narrative: CSW spoke with patient's [place] regarding SNF coverage and copays. Patient's plan will require a $3000/day copay for the first 2 two days of rehab and subsequent days will be covered by her plan. CSW spoke with Maudie Mercury at [facility] about patient's coverage and the facility would require a guarantee of payment or the bed offer would be rescinded. CSW explained the cost to patient and patient reported she cannot afford $6000 as the facility will not offer a payment plan. Patient asked about SNFs in [place] and [place]. There are no SNFs in those areas that are in-network with her insurance.  CSW discussed the possibility of HHPT. Patient requested she receive [place] services with family in [place] rather than in [place] as she will need their assistance and they reside in [place]. [facility] is the only in-network [place] agency for [employer]. Referral made to [facility] with [place]. Per Ronalee Belts, there will be up to a 2 week delay in [facility], but patient will be moved up the list if there are cancellations. CSW updated patient. Patient agreeable to a delay in [facility]. TOC to follow.  Expected Discharge Plan: [place] Barriers to Discharge: Continued Medical Work up  Expected Discharge Plan and Services Expected Discharge Plan: [place] In-house Referral: Clinical Social Work Post Acute Care Choice: [place] Living arrangements for the past 2 months: Apartment  Readmission Risk Interventions No flowsheet data found.

## 2020-11-28 NOTE — Progress Notes (Signed)
Occupational Therapy Treatment Patient Details Name: Kristine Bauer MRN: [id number] DOB: [birth year] Today's Date: 11/28/2020    History of present illness 55 yo female admitted with LE myositis. Hx of irritable myopathy, myositis, DM, CHF, obesity, neuropathy, asthma, falls   OT comments  Upon arrival patient agreeable to sitting up at EOB for LB ADLs and g/h. Patient needing min A to apply lotion to L foot due to L thigh tightness/sore at stitch site, otherwise able to apply lotion to B LEs and UEs. Patient set up A to complete washing face, brushing hair and shaving. Declined getting up to chair, wanting to get back to bed. Patient needed max A to slide up to head of bed with cues for hand placement and use of R LE to try and assist. Cont with POC.   Follow Up Recommendations  SNF    Equipment Recommendations  Other (comment) (TBD)       Precautions / Restrictions Precautions Precautions: Fall       Mobility Bed Mobility Overal bed mobility: Needs Assistance Bed Mobility: Sit to Supine       Sit to supine: Min assist;HOB elevated   General bed mobility comments: to lift LEs onto EOB       Balance Overall balance assessment: Needs assistance;History of Falls Sitting-balance support: Feet supported Sitting balance-Leahy Scale: Good                                     ADL either performed or assessed with clinical judgement   ADL Overall ADL's : Needs assistance/impaired     Grooming: Brushing hair;Set up;Sitting (shaving)       Lower Body Bathing: Minimal assistance;Sitting/lateral leans Lower Body Bathing Details (indicate cue type and reason): min A to apply lotion to L lateral foot due to tightness/sore incision site on thigh. tried problem solving leaving L LE up on edge of bed and bending knee to reach however patient unable                       General ADL Comments: patient declined OOB activity at this time                Cognition Arousal/Alertness: Awake/alert Behavior During Therapy: WFL for tasks assessed/performed Overall Cognitive Status: Within Functional Limits for tasks assessed                                                     Pertinent Vitals/ Pain       Pain Assessment: Faces Faces Pain Scale: Hurts little more Pain Location: L thigh/stitch site Pain Descriptors / Indicators: Operative site guarding;Tightness Pain Intervention(s): Monitored during session         Frequency  Min 2X/week        Progress Toward Goals  OT Goals(current goals can now be found in the care plan section)  Progress towards OT goals: Progressing toward goals  Acute Rehab OT Goals Patient Stated Goal: to regain independence OT Goal Formulation: With patient Time For Goal Achievement: 12/10/20 Potential to Achieve Goals: Good ADL Goals Additional ADL Goal #1: Patient will complete a.m. ADLs with Mod I, LRAD and good safety awareness. Additional ADL Goal #2: Patient will complete ADL transfers with Mod  I and LRAD with good safety awareness. Additional ADL Goal #3: Patient will recall 3 fall reducing strategies to decrease risk of falls.  Plan Discharge plan remains appropriate       AM-PAC OT "6 Clicks" Daily Activity     Outcome Measure   Help from another person eating meals?: None Help from another person taking care of personal grooming?: A Little Help from another person toileting, which includes using toliet, bedpan, or urinal?: A Little Help from another person bathing (including washing, rinsing, drying)?: A Little Help from another person to put on and taking off regular upper body clothing?: A Little Help from another person to put on and taking off regular lower body clothing?: A Little 6 Click Score: 19    End of Session  OT Visit Diagnosis: Unsteadiness on feet (R26.81);Other abnormalities of gait and mobility (R26.89);History of falling (Z91.81);Muscle  weakness (generalized) (M62.81);Pain Pain - Right/Left: Left Pain - part of body: Leg   Activity Tolerance Patient tolerated treatment well   Patient Left in bed;with call bell/phone within reach   Nurse Communication Mobility status        Time: 1110-1134 OT Time Calculation (min): 24 min  Charges: OT General Charges $OT Visit: 1 Visit OT Treatments $Self Care/Home Management : 23-37 mins  Delbert Phenix OT OT pager: [phone]   Rosemary Holms 11/28/2020, 12:26 PM

## 2020-11-28 NOTE — Progress Notes (Signed)
Physical Therapy Treatment Patient Details Name: Kristine Bauer MRN: [id number] DOB: February 22, 1966 Today's Date: 11/28/2020    History of Present Illness 55 yo female admitted with LE myositis. Hx of irritable myopathy, myositis, DM, CHF, obesity, neuropathy, asthma, falls    PT Comments    Pt on BSC on arrival.  General transfer comment: pt able from elevated surface and increased forward momentum.  B weak quads.  Assisted off BSC, elevated bed and straight back chair.  Assisted with amb.  General Gait Details: first amb with a RW a good distance > 100 feet when she asked to walk with her cane.  "I hate that walker".  Pt amb with SPC a functional distance with decreased stability compared to walker and increased lateral sway with notable limp.  But no overt LOB. Assisted back to bed.  General bed mobility comments: required increased assist back to bed to support/guide.   Pt lives home alone in apartment in [place] and works part time.  She plans to stay with family vs private pay Rehab.  Notified RN.    Follow Up Recommendations  Home health PT (pt wants to D/C to home and can have family assist)     Equipment Recommendations  Rolling walker with 5" wheels;3in1 (PT)    Recommendations for Other Services       Precautions / Restrictions Precautions Precautions: Fall    Mobility  Bed Mobility Overal bed mobility: Needs Assistance Bed Mobility: Supine to Sit;Sit to Supine     Supine to sit: Supervision;Min guard Sit to supine: Min assist   General bed mobility comments: required increased assist back to bed to support/guide    Transfers Overall transfer level: Needs assistance Equipment used: Rolling walker (2 wheeled);Straight cane Transfers: Sit to/from Stand Sit to Stand: Supervision;Min guard         General transfer comment: pt able from elevated surface and increased forward momentum.  B weak quads.  Assisted off BSC, elevated bed and straight back  chair.  Ambulation/Gait Ambulation/Gait assistance: Supervision;Min guard Gait Distance (Feet): 110 Feet Assistive device: Rolling walker (2 wheeled);Straight cane Gait Pattern/deviations: Step-through pattern;Decreased step length - right;Decreased step length - left;Decreased stride length Gait velocity: decreased   General Gait Details: first amb with a RW a good distance > 100 feet when she asked to walk with her cane.  "I hate that walker".  Pt amb with SPC a functional distance with decreased stability compared to walker and increased lateral sway with notable limp.  But no overt LOB.   Stairs             Wheelchair Mobility    Modified Rankin (Stroke Patients Only)       Balance                                            Cognition Arousal/Alertness: Awake/alert Behavior During Therapy: WFL for tasks assessed/performed Overall Cognitive Status: Within Functional Limits for tasks assessed                                 General Comments: AxO x 3 very pleasant  lady      Exercises      General Comments        Pertinent Vitals/Pain Pain Assessment: Faces Pain Score: 3  Faces Pain  Scale: Hurts a little bit Pain Location: L thigh/stitch site Pain Descriptors / Indicators: Operative site guarding;Tightness Pain Intervention(s): Monitored during session;Repositioned    Home Living                      Prior Function            PT Goals (current goals can now be found in the care plan section) Progress towards PT goals: Progressing toward goals    Frequency    Min 3X/week      PT Plan Current plan remains appropriate    Co-evaluation              AM-PAC PT "6 Clicks" Mobility   Outcome Measure  Help needed turning from your back to your side while in a flat bed without using bedrails?: None Help needed moving from lying on your back to sitting on the side of a flat bed without using bedrails?:  None Help needed moving to and from a bed to a chair (including a wheelchair)?: A Little Help needed standing up from a chair using your arms (e.g., wheelchair or bedside chair)?: A Little Help needed to walk in hospital room?: A Little Help needed climbing 3-5 steps with a railing? : A Lot 6 Click Score: 19    End of Session Equipment Utilized During Treatment: Gait belt Activity Tolerance: Patient limited by fatigue;Patient limited by pain Patient left: in chair;with call bell/phone within reach;with chair alarm set   PT Visit Diagnosis: Pain;Difficulty in walking, not elsewhere classified (R26.2);Muscle weakness (generalized) (M62.81) Pain - part of body: Leg     Time: 9702-6378 PT Time Calculation (min) (ACUTE ONLY): 33 min  Charges:  $Gait Training: 8-22 mins $Therapeutic Activity: 8-22 mins                     Rica Koyanagi  PTA Acute  Rehabilitation Services Pager      [phone] Office      [phone]

## 2020-11-28 NOTE — Progress Notes (Signed)
[facility] PROGRESS NOTE   Kristine Bauer BJY:[id number] DOB: 02-28-1966 DOA: 11/24/2020  PCP: Nicolette Bang, DO  Brief narrative  55 y.o. female with past medical history of  myositis, insulin-dependent type 2 diabetes, hypertension, diastolic CHF  presented to the hospital with complaints of difficulty getting up from the toilet due to bilateral lower extremity weakness.  Patient had been having bilateral lower extremity weakness for around 2 years and was seen by neurology as outpatient.  She was offered muscle biopsy but she had declined biopsy as outpatient. Patient presented  this time and  she wished to proceed with biopsy.    During hospitalization, patient underwent biopsy by surgical team on 11/27/2020.  At this time awaiting for rehabilitation placement.  Assessment/Plan:  Bilateral lower extremity weakness with  history of myositis History of myositis in the past but no biopsy was done in the past..  MRI of the thoracic and lumbar spine without any significant findings.  Seen by neurology.  Patient underwent muscle biopsy on 11/27/2020 by general surgery.  Continue gabapentin for neuropathic pain.  Still complains of severe pain so oxycodone was added to the regimen.  Will increase gabapentin to 3 times a day.  Seen by physical therapy, occupational therapy who recommended rehabilitation.  Patient had EMG as outpatient.  CK is elevated.  Apical chest pain Troponins negative.  EKG unremarkable.  Possible acid reflux.  Continue twice a day PPI.  Asymptomatic at this time  Sinus tachycardia most likely due to anxiety.  Likely chronic in nature.    Insulin-dependent diabetes mellitus type 2, poorly controlled Latest hemoglobin A1c of 12.5.  On Lantus, Metformin sulfonylurea at home.  Continue sliding-scale insulin, Accu-Cheks, diabetic diet, Lantus while in the hospital  Essential hypertension Continue amlodipine, hydrochlorothiazide and Diovan    Elevated LFTs. Negative hepatitis panel.  Could be secondary to myositis. . Normocytic anemia Hemoglobin of 10.2.  No evidence of bleeding.  Morbid obesity Would benefit from weight loss as outpatient.  DVT Prophylaxis: Lovenox subcu  Code Status: Full code  Family Communication:  None today  Status is: Inpatient  Remains inpatient appropriate because:Unsafe d/c plan and Inpatient level of care appropriate due to severity of illness, further diagnostic testing, need for rehabilitation  Dispo: The patient is from: Home              Anticipated d/c is to: skilled nursing facility              Patient currently is not medically stable to d/c.   Difficult to place patient No  Consultants:  Neurology General surgery  Procedures:  Muscle biopsy on 11/27/2020  Antibiotics: None  Medications:  Scheduled: . amLODipine  5 mg Oral Daily  . enoxaparin (LOVENOX) injection  40 mg Subcutaneous Q24H  . gabapentin  600 mg Oral BID  . insulin aspart  0-15 Units Subcutaneous TID WC  . insulin aspart  0-5 Units Subcutaneous QHS  . insulin glargine  28 Units Subcutaneous QHS  . irbesartan  150 mg Oral Daily  . montelukast  10 mg Oral QHS  . oxymetazoline  1 spray Each Nare BID  . pantoprazole  40 mg Oral Daily   Continuous:  YQM:[id number] **OR** acetaminophen, albuterol, ALPRAZolam, guaiFENesin, ondansetron **OR** ondansetron (ZOFRAN) IV, oxyCODONE-acetaminophen, polyethylene glycol, sodium chloride   Subjective: Today, patient was seen and examined at bedside.  Complains of pain over the leg.  Denies any shortness of breath cough fever chills.   Objective:  Vital Signs  Vitals:   11/27/20 1546 11/27/20 1831 11/27/20 2047 11/28/20 0506  BP: (!) 157/76 (!) 164/87 (!) 154/63 129/66  Pulse: (!) 105 (!) 108 (!) 110 97  Resp: 18 18 17 18   Temp: 97.7 F (36.5 C) (!) 97.5 F (36.4 C) 98.1 F (36.7 C) 97.9 F (36.6 C)  TempSrc: Oral Oral Oral Oral  SpO2: 98% 99% 96%  100%    Intake/Output Summary (Last 24 hours) at 11/28/2020 1016 Last data filed at 11/28/2020 0600 Gross per 24 hour  Intake 730 ml  Output 1650 ml  Net -920 ml   Filed Weights   Physical exam General: Obese built, not in obvious distress HENT:   No scleral pallor or icterus noted. Oral mucosa is moist.  Chest:  Clear breath sounds.  Diminished breath sounds bilaterally. No crackles or wheezes.  CVS: S1 &S2 heard. No murmur.  Regular rate and rhythm. Abdomen: Soft, nontender, nondistended.  Bowel sounds are heard.   Extremities: No cyanosis, clubbing or edema.  Peripheral pulses are palpable. Psych: Alert, awake and oriented, normal mood CNS:  No cranial nerve deficits.  Mild weakness of the lower extremities. Skin: Warm and dry.  No rashes noted.   Lab Results:  Data Reviewed: I have personally reviewed the following labs and imaging studies  CBC: Recent Labs  Lab 11/24/20 2052 11/25/20 0653 11/26/20 0506 11/27/20 0447 11/28/20 0447  WBC 6.9 7.6 7.2 7.3 12.8*  NEUTROABS 4.3  --   --   --   --   HGB 11.1* 10.4* 10.5* 11.1* 10.2*  HCT 36.5 33.9* 35.1* 36.5 33.2*  MCV 82.0 81.3 83.2 81.8 80.6  PLT 327 317 292 314 094    Basic Metabolic Panel: Recent Labs  Lab 11/24/20 2052 11/25/20 0100 11/25/20 0653 11/26/20 0506 11/27/20 0447 11/28/20 0447  NA 134*  --  135 136 135 133*  K 4.5  --  3.6 4.2 4.5 4.0  CL 97*  --  101 105 102 101  CO2 26  --  25 24 27 24   GLUCOSE 360*  --  301* 196* 263* 339*  BUN 21*  --  15 12 12 20   CREATININE 0.70  --  0.53 0.39* 0.44 0.44  CALCIUM 9.5  --  8.7* 8.9 9.1 8.9  MG  --  2.0  --   --   --  1.9  PHOS  --  4.4  --   --   --  3.4    GFR: CrCl cannot be calculated (Unknown ideal weight.).  Liver Function Tests: Recent Labs  Lab 11/24/20 2052 11/25/20 0653 11/26/20 0506 11/27/20 0447 11/28/20 0447  AST 114* 91* 84* 82* 74*  ALT 232* 184* 167* 162* 144*  ALKPHOS 67 58 53 54 52  BILITOT 0.6 0.5 0.6 0.4 0.5  PROT 7.4  6.5 6.3* 6.5 6.6  ALBUMIN 3.8 3.3* 3.2* 3.2* 3.2*     Cardiac Enzymes: Recent Labs  Lab 11/24/20 2052  CKTOTAL 7,517*     HbA1C: No results for input(s): HGBA1C in the last 72 hours.  CBG: Recent Labs  Lab 11/27/20 1154 11/27/20 1650 11/27/20 1826 11/27/20 2058 11/28/20 0731  GLUCAP 167* 456* 381* 462* 260*     Recent Results (from the past 240 hour(s))  Resp Panel by RT-PCR (Flu A&B, Covid) Nasopharyngeal Swab     Status: None   Collection Time: 11/24/20 11:30 PM   Specimen: Nasopharyngeal Swab; Nasopharyngeal(NP) swabs in vial transport medium  Result Value Ref Range  Status   SARS Coronavirus 2 by RT PCR NEGATIVE NEGATIVE Final    Comment: (NOTE) SARS-CoV-2 target nucleic acids are NOT DETECTED.  The SARS-CoV-2 RNA is generally detectable in upper respiratory specimens during the acute phase of infection. The lowest concentration of SARS-CoV-2 viral copies this assay can detect is 138 copies/mL. A negative result does not preclude SARS-Cov-2 infection and should not be used as the sole basis for treatment or other patient management decisions. A negative result may occur with  improper specimen collection/handling, submission of specimen other than nasopharyngeal swab, presence of viral mutation(s) within the areas targeted by this assay, and inadequate number of viral copies(<138 copies/mL). A negative result must be combined with clinical observations, patient history, and epidemiological information. The expected result is Negative.  Fact Sheet for Patients:  EntrepreneurPulse.com.au  Fact Sheet for Healthcare Providers:  IncredibleEmployment.be  This test is no t yet approved or cleared by the [place] FDA and  has been authorized for detection and/or diagnosis of SARS-CoV-2 by FDA under an Emergency Use Authorization (EUA). This EUA will remain  in effect (meaning this test can be used) for the duration of  the COVID-19 declaration under Section 564(b)(1) of the Act, 21 U.S.C.section 360bbb-3(b)(1), unless the authorization is terminated  or revoked sooner.       Influenza A by PCR NEGATIVE NEGATIVE Final   Influenza B by PCR NEGATIVE NEGATIVE Final    Comment: (NOTE) The Xpert Xpress SARS-CoV-2/FLU/RSV plus assay is intended as an aid in the diagnosis of influenza from Nasopharyngeal swab specimens and should not be used as a sole basis for treatment. Nasal washings and aspirates are unacceptable for Xpert Xpress SARS-CoV-2/FLU/RSV testing.  Fact Sheet for Patients: EntrepreneurPulse.com.au  Fact Sheet for Healthcare Providers: IncredibleEmployment.be  This test is not yet approved or cleared by the [place] FDA and has been authorized for detection and/or diagnosis of SARS-CoV-2 by FDA under an Emergency Use Authorization (EUA). This EUA will remain in effect (meaning this test can be used) for the duration of the COVID-19 declaration under Section 564(b)(1) of the Act, 21 U.S.C. section 360bbb-3(b)(1), unless the authorization is terminated or revoked.  Performed at [facility], [place] [address] [place], [place] [address]   MRSA PCR Screening     Status: None   Collection Time: 11/27/20  5:23 AM   Specimen: Nasal Mucosa; Nasopharyngeal  Result Value Ref Range Status   MRSA by PCR NEGATIVE NEGATIVE Final    Comment:        The GeneXpert MRSA Assay (FDA approved for NASAL specimens only), is one component of a comprehensive MRSA colonization surveillance program. It is not intended to diagnose MRSA infection nor to guide or monitor treatment for MRSA infections. Performed at [facility], [place] [address], [place], [place] [address]       Radiology Studies: DG CHEST PORT 1 VIEW  Result Date: 11/26/2020 CLINICAL DATA:  Midline chest pain. EXAM: PORTABLE CHEST 1 VIEW COMPARISON:   02/09/2020. FINDINGS: Mediastinum hilar structures normal. Heart size normal. Low lung volumes. No focal infiltrate. No pleural effusion or pneumothorax. Thoracic spine scoliosis and degenerative change. IMPRESSION: Low lung volumes. No acute cardiopulmonary disease. Electronically Signed   By: Marcello Moores  Register   On: 11/26/2020 12:16      LOS: 3 days   Lemario Chaikin,MD [facility] Pager on [email or web address] 11/28/2020, 10:16 AM

## 2020-11-28 NOTE — Progress Notes (Signed)
   Progress Note  1 Day Post-Op  Subjective: Patient reports mild soreness but overall pain well controlled. Discussed ok to shower.   Objective: Vital signs in last 24 hours: Temp:  [97.5 F (36.4 C)-98.4 F (36.9 C)] 97.9 F (36.6 C) (03/24 0506) Pulse Rate:  [95-110] 97 (03/24 0506) Resp:  [16-19] 18 (03/24 0506) BP: (120-168)/(63-91) 129/66 (03/24 0506) SpO2:  [96 %-100 %] 100 % (03/24 0506) Last BM Date: 11/23/20  Intake/Output from previous day: 03/23 0701 - 03/24 0700 In: 730 [P.O.:730] Out: 1650 [Urine:1650] Intake/Output this shift: No intake/output data recorded.  PE: General: pleasant, WD, overweight female who is laying in bed in NAD Lungs: Respiratory effort nonlabored Left thigh: incision c/d/i with steri-strips present, no ecchymosis or hematoma    Lab Results:  Recent Labs    11/27/20 0447 11/28/20 0447  WBC 7.3 12.8*  HGB 11.1* 10.2*  HCT 36.5 33.2*  PLT 314 323   BMET Recent Labs    11/27/20 0447 11/28/20 0447  NA 135 133*  K 4.5 4.0  CL 102 101  CO2 27 24  GLUCOSE 263* 339*  BUN 12 20  CREATININE 0.44 0.44  CALCIUM 9.1 8.9   PT/INR No results for input(s): LABPROT, INR in the last 72 hours. CMP     Component Value Date/Time   NA 133 (L) 11/28/2020 0447   NA 137 02/09/2020 1601   K 4.0 11/28/2020 0447   CL 101 11/28/2020 0447   CO2 24 11/28/2020 0447   GLUCOSE 339 (H) 11/28/2020 0447   BUN 20 11/28/2020 0447   BUN 23 02/09/2020 1601   CREATININE 0.44 11/28/2020 0447   CREATININE 0.82 06/24/2012 1438   CALCIUM 8.9 11/28/2020 0447   PROT 6.6 11/28/2020 0447   PROT 7.3 02/09/2020 1601   ALBUMIN 3.2 (L) 11/28/2020 0447   ALBUMIN 4.4 02/09/2020 1601   AST 74 (H) 11/28/2020 0447   ALT 144 (H) 11/28/2020 0447   ALKPHOS 52 11/28/2020 0447   BILITOT 0.5 11/28/2020 0447   BILITOT <0.2 02/09/2020 1601   GFRNONAA >60 11/28/2020 0447   GFRAA 99 02/09/2020 1601   Lipase  No results found for: LIPASE     Studies/Results: DG  CHEST PORT 1 VIEW  Result Date: 11/26/2020 CLINICAL DATA:  Midline chest pain. EXAM: PORTABLE CHEST 1 VIEW COMPARISON:  02/09/2020. FINDINGS: Mediastinum hilar structures normal. Heart size normal. Low lung volumes. No focal infiltrate. No pleural effusion or pneumothorax. Thoracic spine scoliosis and degenerative change. IMPRESSION: Low lung volumes. No acute cardiopulmonary disease. Electronically Signed   By: Marcello Moores  Register   On: 11/26/2020 12:16    Anti-infectives: Anti-infectives (From admission, onward)   Start     Dose/Rate Route Frequency Ordered Stop   11/27/20 0600  ceFAZolin (ANCEF) IVPB 2g/100 mL premix        2 g 200 mL/hr over 30 Minutes Intravenous On call to O.R. 11/27/20 0508 11/27/20 1011       Assessment/Plan S/P left thigh muscle biopsy 11/27/20 Dr. Donne Hazel - POD#1 - incision c/d/i - ok to shower - path pending  - no other general surgery needs, follow up as needed if incisional issues. We will sign off, please call if we can be of further assistance  LOS: 3 days    Norm Parcel , [facility] 11/28/2020, 8:36 AM Please see Amion for pager number during day hours 7:00am-4:30pm

## 2020-11-29 ENCOUNTER — Ambulatory Visit: Payer: 59 | Admitting: Cardiovascular Disease

## 2020-11-29 ENCOUNTER — Encounter: Payer: Self-pay | Admitting: Neurology

## 2020-11-29 DIAGNOSIS — M609 Myositis, unspecified: Secondary | ICD-10-CM | POA: Diagnosis not present

## 2020-11-29 DIAGNOSIS — E1169 Type 2 diabetes mellitus with other specified complication: Secondary | ICD-10-CM | POA: Diagnosis not present

## 2020-11-29 DIAGNOSIS — I10 Essential (primary) hypertension: Secondary | ICD-10-CM | POA: Diagnosis not present

## 2020-11-29 DIAGNOSIS — Z794 Long term (current) use of insulin: Secondary | ICD-10-CM | POA: Diagnosis not present

## 2020-11-29 LAB — GLUCOSE, CAPILLARY
Glucose-Capillary: 183 mg/dL — ABNORMAL HIGH (ref 70–99)
Glucose-Capillary: 257 mg/dL — ABNORMAL HIGH (ref 70–99)
Glucose-Capillary: 297 mg/dL — ABNORMAL HIGH (ref 70–99)
Glucose-Capillary: 343 mg/dL — ABNORMAL HIGH (ref 70–99)
Glucose-Capillary: 340 mg/dL — ABNORMAL HIGH (ref 70–99)

## 2020-11-29 LAB — COMPREHENSIVE METABOLIC PANEL
ALT: 141 U/L — ABNORMAL HIGH (ref 0–44)
AST: 83 U/L — ABNORMAL HIGH (ref 15–41)
Albumin: 3.3 g/dL — ABNORMAL LOW (ref 3.5–5.0)
Alkaline Phosphatase: 52 U/L (ref 38–126)
Anion gap: 7 (ref 5–15)
BUN: 17 mg/dL (ref 6–20)
CO2: 24 mmol/L (ref 22–32)
Calcium: 8.9 mg/dL (ref 8.9–10.3)
Chloride: 105 mmol/L (ref 98–111)
Creatinine, Ser: 0.51 mg/dL (ref 0.44–1.00)
GFR, Estimated: >60 mL/min (ref >60)
Glucose, Bld: 238 mg/dL — ABNORMAL HIGH (ref 70–99)
Potassium: 4.2 mmol/L (ref 3.5–5.1)
Sodium: 136 mmol/L (ref 135–145)
Total Bilirubin: 0.3 mg/dL (ref 0.3–1.2)
Total Protein: 6.6 g/dL (ref 6.5–8.1)

## 2020-11-29 LAB — CBC
HCT: 34.8 % — ABNORMAL LOW (ref 36.0–46.0)
Hemoglobin: 10.5 g/dL — ABNORMAL LOW (ref 12.0–15.0)
MCH: 25.1 pg — ABNORMAL LOW (ref 26.0–34.0)
MCHC: 30.2 g/dL (ref 30.0–36.0)
MCV: 83.1 fL (ref 80.0–100.0)
Platelets: 328 10*3/uL (ref 150–400)
RBC: 4.19 MIL/uL (ref 3.87–5.11)
RDW: 14.6 % (ref 11.5–15.5)
WBC: 10.3 10*3/uL (ref 4.0–10.5)
nRBC: 0 % (ref 0.0–0.2)

## 2020-11-29 LAB — MAGNESIUM: Magnesium: 2 mg/dL (ref 1.7–2.4)

## 2020-11-29 LAB — CK: Total CK: 2578 U/L — ABNORMAL HIGH (ref 38–234)

## 2020-11-29 LAB — PHOSPHORUS: Phosphorus: 4 mg/dL (ref 2.5–4.6)

## 2020-11-29 MED ORDER — DIPHENHYDRAMINE HCL 50 MG/ML IJ SOLN
25.0000 mg | Freq: Once | INTRAMUSCULAR | Status: AC
  Administered 2020-11-29: 25 mg via INTRAVENOUS
  Filled 2020-11-29: qty 1

## 2020-11-29 MED ORDER — INSULIN ASPART PROT & ASPART (70-30 MIX) 100 UNIT/ML ~~LOC~~ SUSP
22.0000 [IU] | Freq: Two times a day (BID) | SUBCUTANEOUS | Status: DC
  Administered 2020-11-29 – 2020-12-02 (×6): 22 [IU] via SUBCUTANEOUS
  Filled 2020-11-29: qty 10

## 2020-11-29 NOTE — Progress Notes (Signed)
[facility] PROGRESS NOTE   Miho Monda TKW:[id number] DOB: 12-15-1965 DOA: 11/24/2020  PCP: Nicolette Bang, DO  Brief narrative  55 y.o. female with past medical history of  myositis, insulin-dependent type 2 diabetes, hypertension, diastolic CHF  presented to the hospital with complaints of difficulty getting up from the toilet due to bilateral lower extremity weakness.  Patient had been having bilateral lower extremity weakness for around 2 years and was seen by neurology as outpatient.  She was offered muscle biopsy but she had declined biopsy as outpatient. Patient presented  this time and  she wished to proceed with biopsy.  During hospitalization, patient underwent biopsy by surgical team on 11/27/2020.  Physical therapy saw the patient and recommended rehabilitation.  Assessment/Plan:  Bilateral lower extremity weakness with  history of myositis Patient lives in this area and is wanting to move to [place] area.  Physical therapy had seen the patient and recommended skilled nursing facility placement but due to financial issues plan is physical therapy at home/home health.  Patient does have history of myositis in the past but no biopsy was done in the past..  MRI of the thoracic and lumbar spine without any significant findings.  Seen by neurology.  Patient underwent muscle biopsy on 11/27/2020 by general surgery.  Biopsy pending.  Continue gabapentin oxycodone for pain.  CK is elevated.  Apical chest pain Troponins negative.  EKG unremarkable.  Possible acid reflux.  Continue twice a day PPI.  Asymptomatic at this time  Sinus tachycardia most likely due to anxiety.  Improved.  Insulin-dependent diabetes mellitus type 2, poorly controlled Latest hemoglobin A1c of 12.5.  On Lantus, Metformin sulfonylurea at home.  Was not really taking her Lantus due to financial issues.  Diabetic coordinator was consulted and recommended Novolin [place] 70/30 insulin pen on  discharge.  Continue sliding-scale insulin, Accu-Cheks, diabetic diet,.  Start 70/30 insulin today.  Closely monitor blood glucose levels.  Essential hypertension Continue amlodipine, hydrochlorothiazide and Diovan overall numbers reasonably controlled.  Elevated LFTs. Negative hepatitis panel.  Could be secondary to myositis. . Normocytic anemia Latest hemoglobin of 10.5 and is stable.  No evidence of bleeding.  Morbid obesity Would benefit from weight loss as outpatient.  DVT Prophylaxis: Lovenox subcu  Code Status: Full code  Family Communication:  None today  Status is: Inpatient  Remains inpatient appropriate because Inpatient level of care appropriate due to severity of illness, status post muscle biopsy, need for rehabilitation  Dispo: The patient is from: Home              Anticipated d/c is to: skilled nursing facility for PT but looks like home with home health on discharge.  Likely home tomorrow.              Patient currently is not medically stable to d/c.   Difficult to place patient No  Consultants:  Neurology General surgery  Procedures:  Muscle biopsy on 11/27/2020  Antibiotics: None  Medications:  Scheduled: . amLODipine  5 mg Oral Daily  . enoxaparin (LOVENOX) injection  40 mg Subcutaneous Q24H  . gabapentin  600 mg Oral TID  . insulin aspart  0-15 Units Subcutaneous TID WC  . insulin aspart  0-5 Units Subcutaneous QHS  . insulin aspart protamine- aspart  22 Units Subcutaneous BID WC  . irbesartan  150 mg Oral Daily  . montelukast  10 mg Oral QHS  . oxymetazoline  1 spray Each Nare BID  . pantoprazole  40 mg Oral Daily  . polyethylene glycol  17 g Oral Daily   Continuous:  MPN:[id number] **OR** acetaminophen, albuterol, ALPRAZolam, guaiFENesin, ondansetron **OR** ondansetron (ZOFRAN) IV, oxyCODONE-acetaminophen, simethicone, sodium chloride   Subjective: Today, patient was seen and examined at bedside.  Complains of mild pain in the  legs.  Wishes to go home with home health.  Denies chills nausea vomiting.  Objective:  Vital Signs  Vitals:   11/28/20 0506 11/28/20 1404 11/28/20 2103 11/29/20 0640  BP: 129/66 140/75 138/75 (!) 157/73  Pulse: 97 95 98 100  Resp: 18 18 18 18   Temp: 97.9 F (36.6 C) 97.6 F (36.4 C) 98 F (36.7 C) 98.2 F (36.8 C)  TempSrc: Oral Oral Oral Oral  SpO2: 100% 100% 100% 100%    Intake/Output Summary (Last 24 hours) at 11/29/2020 1314 Last data filed at 11/29/2020 1000 Gross per 24 hour  Intake 1320 ml  Output 2650 ml  Net -1330 ml   Filed Weights   Physical exam General: Obese built, not in obvious distress HENT:   No scleral pallor or icterus noted. Oral mucosa is moist.  Chest:  Clear breath sounds.  Diminished breath sounds bilaterally. No crackles or wheezes.  CVS: S1 &S2 heard. No murmur.  Regular rate and rhythm. Abdomen: Soft, nontender, nondistended.  Bowel sounds are heard.   Extremities: No cyanosis, clubbing or edema.  Peripheral pulses are palpable. Psych: Alert, awake and oriented, normal mood CNS:  No cranial nerve deficits.  Mild weakness of the bilateral lower extremities. Skin: Warm and dry.  No rashes noted.   Lab Results:  Data Reviewed: I have personally reviewed the following labs and imaging studies  CBC: Recent Labs  Lab 11/24/20 2052 11/25/20 0653 11/26/20 0506 11/27/20 0447 11/28/20 0447 11/29/20 0445  WBC 6.9 7.6 7.2 7.3 12.8* 10.3  NEUTROABS 4.3  --   --   --   --   --   HGB 11.1* 10.4* 10.5* 11.1* 10.2* 10.5*  HCT 36.5 33.9* 35.1* 36.5 33.2* 34.8*  MCV 82.0 81.3 83.2 81.8 80.6 83.1  PLT 327 317 292 314 323 761    Basic Metabolic Panel: Recent Labs  Lab 11/25/20 0100 11/25/20 0653 11/26/20 0506 11/27/20 0447 11/28/20 0447 11/29/20 0445  NA  --  135 136 135 133* 136  K  --  3.6 4.2 4.5 4.0 4.2  CL  --  101 105 102 101 105  CO2  --  25 24 27 24 24   GLUCOSE  --  301* 196* 263* 339* 238*  BUN  --  15 12 12 20 17   CREATININE   --  0.53 0.39* 0.44 0.44 0.51  CALCIUM  --  8.7* 8.9 9.1 8.9 8.9  MG 2.0  --   --   --  1.9 2.0  PHOS 4.4  --   --   --  3.4 4.0    GFR: CrCl cannot be calculated (Unknown ideal weight.).  Liver Function Tests: Recent Labs  Lab 11/25/20 0653 11/26/20 0506 11/27/20 0447 11/28/20 0447 11/29/20 0445  AST 91* 84* 82* 74* 83*  ALT 184* 167* 162* 144* 141*  ALKPHOS 58 53 54 52 52  BILITOT 0.5 0.6 0.4 0.5 0.3  PROT 6.5 6.3* 6.5 6.6 6.6  ALBUMIN 3.3* 3.2* 3.2* 3.2* 3.3*     Cardiac Enzymes: Recent Labs  Lab 11/24/20 2052 11/29/20 0445  CKTOTAL 7,517* 2,578*     HbA1C: No results for input(s): HGBA1C in the last 72 hours.  CBG:  Recent Labs  Lab 11/28/20 1142 11/28/20 1627 11/28/20 2107 11/29/20 0726 11/29/20 1216  GLUCAP 280* 301* 330* 183* 257*     Recent Results (from the past 240 hour(s))  Resp Panel by RT-PCR (Flu A&B, Covid) Nasopharyngeal Swab     Status: None   Collection Time: 11/24/20 11:30 PM   Specimen: Nasopharyngeal Swab; Nasopharyngeal(NP) swabs in vial transport medium  Result Value Ref Range Status   SARS Coronavirus 2 by RT PCR NEGATIVE NEGATIVE Final    Comment: (NOTE) SARS-CoV-2 target nucleic acids are NOT DETECTED.  The SARS-CoV-2 RNA is generally detectable in upper respiratory specimens during the acute phase of infection. The lowest concentration of SARS-CoV-2 viral copies this assay can detect is 138 copies/mL. A negative result does not preclude SARS-Cov-2 infection and should not be used as the sole basis for treatment or other patient management decisions. A negative result may occur with  improper specimen collection/handling, submission of specimen other than nasopharyngeal swab, presence of viral mutation(s) within the areas targeted by this assay, and inadequate number of viral copies(<138 copies/mL). A negative result must be combined with clinical observations, patient history, and epidemiological information. The  expected result is Negative.  Fact Sheet for Patients:  EntrepreneurPulse.com.au  Fact Sheet for Healthcare Providers:  IncredibleEmployment.be  This test is no t yet approved or cleared by the [place] FDA and  has been authorized for detection and/or diagnosis of SARS-CoV-2 by FDA under an Emergency Use Authorization (EUA). This EUA will remain  in effect (meaning this test can be used) for the duration of the COVID-19 declaration under Section 564(b)(1) of the Act, 21 U.S.C.section 360bbb-3(b)(1), unless the authorization is terminated  or revoked sooner.       Influenza A by PCR NEGATIVE NEGATIVE Final   Influenza B by PCR NEGATIVE NEGATIVE Final    Comment: (NOTE) The Xpert Xpress SARS-CoV-2/FLU/RSV plus assay is intended as an aid in the diagnosis of influenza from Nasopharyngeal swab specimens and should not be used as a sole basis for treatment. Nasal washings and aspirates are unacceptable for Xpert Xpress SARS-CoV-2/FLU/RSV testing.  Fact Sheet for Patients: EntrepreneurPulse.com.au  Fact Sheet for Healthcare Providers: IncredibleEmployment.be  This test is not yet approved or cleared by the [place] FDA and has been authorized for detection and/or diagnosis of SARS-CoV-2 by FDA under an Emergency Use Authorization (EUA). This EUA will remain in effect (meaning this test can be used) for the duration of the COVID-19 declaration under Section 564(b)(1) of the Act, 21 U.S.C. section 360bbb-3(b)(1), unless the authorization is terminated or revoked.  Performed at [facility], [place] [address] [place], [place] [address]   MRSA PCR Screening     Status: None   Collection Time: 11/27/20  5:23 AM   Specimen: Nasal Mucosa; Nasopharyngeal  Result Value Ref Range Status   MRSA by PCR NEGATIVE NEGATIVE Final    Comment:        The GeneXpert MRSA Assay (FDA approved for  NASAL specimens only), is one component of a comprehensive MRSA colonization surveillance program. It is not intended to diagnose MRSA infection nor to guide or monitor treatment for MRSA infections. Performed at [facility], [place] [address] [place], [place] [address]       Radiology Studies: No results found.    LOS: 4 days   Collin [facility] operational officer on [email or web address] 11/29/2020, 1:14 PM

## 2020-11-29 NOTE — Progress Notes (Signed)
Inpatient Diabetes Program Recommendations  AACE/ADA: New Consensus Statement on Inpatient Glycemic Control (2015)  Target Ranges:  Prepandial:   less than 140 mg/dL      Peak postprandial:   less than 180 mg/dL (1-2 hours)      Critically ill patients:  140 - 180 mg/dL   Lab Results  Component Value Date   GLUCAP 183 (H) 11/29/2020   HGBA1C 12.5 (H) 11/25/2020    Review of Glycemic Control  Diabetes history: DM2 Outpatient Diabetes medications: Lantus 10 units QD, Amaryl 8 mg QD Current orders for Inpatient glycemic control: Lantus 28 units QHS, Novolog 0-15 units TID with meals and 0-5 units QHS  HgbA1C - 12.5%  Spoke with pt at bedside regarding HgbA1C of 12.5%. Pt states she is having trouble affording her insulin, even though she has [employer]. Pt willing to go on affordable insulin, such as Novolin RelioOn 70/30 Flexpen for $44 at [facility]. Discussed monitoring, eating healthy, including physical activity to help control blood sugars. Will need to f/u with PCP and take blood sugar results with her. Discussed hypoglycemia s/s and treatment. Answered questions.   Inpatient Diabetes Program Recommendations:     70/30 22 units BID (starting today at 1700)  Will continue to follow.  For discharge:  Novolin ReliOn 70/30 Flexpen Insulin Pen - # [id number]  Thank you. Lorenda Peck, RD, LDN, CDE Inpatient Diabetes Coordinator [phone]

## 2020-11-29 NOTE — TOC Progression Note (Addendum)
Transition of Care [facility]) - Progression Note   Patient Details  Name: Calynn Ferrero MRN: [id number] Date of Birth: 27-Feb-1966  Transition of Care [facility]) CM/SW [place], LCSW Phone Number: 11/29/2020, 3:07 PM  Clinical Narrative: Patient is now requesting to go to SNF and is aware she will have to pay $3000/day for the first two days before her insurance will provide coverage for the rest of her rehab days. CSW made two attempts to call [employer] with [facility] to see if patient could accept the bed again, but was unable to reach so messages were left regarding starting insurance authorization as [place] requires the facility to start authorization.  CSW updated by Claiborne Billings that she will look into patient's bed and insurance authorization. Clinicals faxed to [facility]ib[facility] Billings so that insurance authorization can be started. TOC to follow.  Expected Discharge Plan: [place] Barriers to Discharge: Continued Medical Work up  Expected Discharge Plan and Services Expected Discharge Plan: [place] In-house Referral: Clinical Social Work Post Acute Care Choice: [place] Living arrangements for the past 2 months: Apartment              Readmission Risk Interventions No flowsheet data found.

## 2020-11-30 DIAGNOSIS — Z794 Long term (current) use of insulin: Secondary | ICD-10-CM | POA: Diagnosis not present

## 2020-11-30 DIAGNOSIS — I10 Essential (primary) hypertension: Secondary | ICD-10-CM | POA: Diagnosis not present

## 2020-11-30 DIAGNOSIS — M609 Myositis, unspecified: Secondary | ICD-10-CM | POA: Diagnosis not present

## 2020-11-30 DIAGNOSIS — E1169 Type 2 diabetes mellitus with other specified complication: Secondary | ICD-10-CM | POA: Diagnosis not present

## 2020-11-30 LAB — GLUCOSE, CAPILLARY
Glucose-Capillary: 125 mg/dL — ABNORMAL HIGH (ref 70–99)
Glucose-Capillary: 171 mg/dL — ABNORMAL HIGH (ref 70–99)
Glucose-Capillary: 148 mg/dL — ABNORMAL HIGH (ref 70–99)
Glucose-Capillary: 183 mg/dL — ABNORMAL HIGH (ref 70–99)

## 2020-11-30 LAB — CBC
HCT: 35.2 % — ABNORMAL LOW (ref 36.0–46.0)
Hemoglobin: 10.7 g/dL — ABNORMAL LOW (ref 12.0–15.0)
MCH: 24.9 pg — ABNORMAL LOW (ref 26.0–34.0)
MCHC: 30.4 g/dL (ref 30.0–36.0)
MCV: 81.9 fL (ref 80.0–100.0)
Platelets: 366 10*3/uL (ref 150–400)
RBC: 4.3 MIL/uL (ref 3.87–5.11)
RDW: 14.8 % (ref 11.5–15.5)
WBC: 9.3 10*3/uL (ref 4.0–10.5)
nRBC: 0 % (ref 0.0–0.2)

## 2020-11-30 LAB — COMPREHENSIVE METABOLIC PANEL
ALT: 135 U/L — ABNORMAL HIGH (ref 0–44)
AST: 78 U/L — ABNORMAL HIGH (ref 15–41)
Albumin: 3.3 g/dL — ABNORMAL LOW (ref 3.5–5.0)
Alkaline Phosphatase: 51 U/L (ref 38–126)
Anion gap: 8 (ref 5–15)
BUN: 18 mg/dL (ref 6–20)
CO2: 26 mmol/L (ref 22–32)
Calcium: 9.2 mg/dL (ref 8.9–10.3)
Chloride: 104 mmol/L (ref 98–111)
Creatinine, Ser: 0.47 mg/dL (ref 0.44–1.00)
GFR, Estimated: >60 mL/min (ref >60)
Glucose, Bld: 141 mg/dL — ABNORMAL HIGH (ref 70–99)
Potassium: 4 mmol/L (ref 3.5–5.1)
Sodium: 138 mmol/L (ref 135–145)
Total Bilirubin: 0.7 mg/dL (ref 0.3–1.2)
Total Protein: 6.7 g/dL (ref 6.5–8.1)

## 2020-11-30 NOTE — Progress Notes (Signed)
Physical Therapy Treatment Patient Details Name: Kristine Bauer MRN: [id number] DOB: [birth year] Today's Date: 11/30/2020    History of Present Illness 55 yo female admitted with LE myositis. Hx of irritable myopathy, myositis, DM, CHF, obesity, neuropathy, asthma, falls    PT Comments    Pt is progressing with mobility.  Min guard-Min assist for mobility on today. She was able to stand statically, for ~2-3 mins, without UE support while performing hair care. She fatigues fairly easily with activity-dyspnea 2/4 during session on today. Discussed d/c plan-She stated she has decreased caregiver assistance and feels she should go to rehab to get stronger before going home. Will continue to follow and progress activity as tolerated.    Follow Up Recommendations  SNF vs Home health PT (pt stated she is now agreeable to st rehab to regain independence)     Equipment Recommendations  Rolling walker with 5" wheels;3in1 (PT)    Recommendations for Other Services       Precautions / Restrictions Precautions Precautions: Fall Restrictions Weight Bearing Restrictions: No    Mobility  Bed Mobility Overal bed mobility: Needs Assistance Bed Mobility: Supine to Sit;Sit to Supine     Supine to sit: Supervision Sit to supine: Min assist   General bed mobility comments: Min assist for LEs back onto bed. Increased time.    Transfers Overall transfer level: Needs assistance Equipment used: Rolling walker (2 wheeled) Transfers: Sit to/from Stand Sit to Stand: Min guard         General transfer comment: x 2-once from bed, once from toilet. increased time with momentum used to power up.  Ambulation/Gait Ambulation/Gait assistance: Min guard Gait Distance (Feet): 125 Feet Assistive device: Rolling walker (2 wheeled);Straight cane Gait Pattern/deviations: Step-through pattern;Decreased stride length     General Gait Details: walked ~125 feet with RW then 15'x2 with straight  cane. a bit steadier with RW. dyspnea 2/4; O2 100% on RA   Stairs             Wheelchair Mobility    Modified Rankin (Stroke Patients Only)       Balance Overall balance assessment: History of Falls           Standing balance-Leahy Scale: Fair                              Cognition Arousal/Alertness: Awake/alert Behavior During Therapy: WFL for tasks assessed/performed Overall Cognitive Status: Within Functional Limits for tasks assessed                                        Exercises      General Comments        Pertinent Vitals/Pain Pain Assessment: Faces Faces Pain Scale: Hurts little more Pain Location: L LE Pain Descriptors / Indicators: Discomfort;Sore Pain Intervention(s): Monitored during session    Home Living                      Prior Function            PT Goals (current goals can now be found in the care plan section) Progress towards PT goals: Progressing toward goals    Frequency    Min 3X/week      PT Plan Current plan remains appropriate    Co-evaluation  AM-PAC PT "6 Clicks" Mobility   Outcome Measure  Help needed turning from your back to your side while in a flat bed without using bedrails?: None Help needed moving from lying on your back to sitting on the side of a flat bed without using bedrails?: None Help needed moving to and from a bed to a chair (including a wheelchair)?: A Little Help needed standing up from a chair using your arms (e.g., wheelchair or bedside chair)?: A Little Help needed to walk in hospital room?: A Little Help needed climbing 3-5 steps with a railing? : A Little 6 Click Score: 20    End of Session Equipment Utilized During Treatment: Gait belt Activity Tolerance: Patient limited by fatigue Patient left: in bed;with call bell/phone within reach;with bed alarm set   PT Visit Diagnosis: Pain;Difficulty in walking, not elsewhere  classified (R26.2);Muscle weakness (generalized) (M62.81) Pain - Right/Left: Left Pain - part of body: Leg     Time: 1415-1441 PT Time Calculation (min) (ACUTE ONLY): 26 min  Charges:  $Gait Training: 23-37 mins                        Doreatha Massed, PT Acute Rehabilitation  Office: [phone] Pager: [phone]

## 2020-11-30 NOTE — Progress Notes (Signed)
[facility] PROGRESS NOTE   Kristine Bauer KDT:[id number] DOB: September 09, 1965 DOA: 11/24/2020  PCP: Nicolette Bang, DO  Brief narrative  55 y.o. female with past medical history of  myositis, insulin-dependent type 2 diabetes, hypertension, diastolic CHF  presented to the hospital with complaints of difficulty getting up from the toilet due to bilateral lower extremity weakness.  Patient had been having bilateral lower extremity weakness for around 2 years and was seen by neurology as outpatient.  She was offered muscle biopsy but she had declined biopsy as outpatient. Patient presented  this time and  she wished to proceed with biopsy.  During hospitalization, patient underwent biopsy by surgical team on 11/27/2020.  Physical therapy saw the patient and recommended rehabilitation.  Assessment/Plan:  Bilateral lower extremity weakness with  history of myositis  Physical therapy had seen the patient and recommended skilled nursing facility placement and patient has decided to go for skilled nursing facility.  Patient does have history of myositis in the past but no biopsy was done in the past..  MRI of the thoracic and lumbar spine without any significant findings.  Seen by neurology.  Patient underwent muscle biopsy on 11/27/2020 by general surgery.  Biopsy pending.  Continue gabapentin, oxycodone for pain.    Apical chest pain Troponins negative.  EKG unremarkable.  Possible acid reflux.  Continue twice a day PPI.  Asymptomatic at this time  Sinus tachycardia most likely due to anxiety.  Improved.  Mild.  Insulin-dependent diabetes mellitus type 2, poorly controlled Latest hemoglobin A1c of 12.5.  On Lantus, Metformin sulfonylurea at home.  Was not really taking her Lantus due to financial issues.  Diabetic coordinator was consulted and recommended Novolin [place] 70/30 insulin pen on discharge.  Continue sliding-scale insulin, Accu-Cheks, diabetic diet,.  Started 70/30  insulin since 11/29/2020. closely monitor blood glucose levels.  Latest POC glucose of 172  Essential hypertension Continue amlodipine, hydrochlorothiazide and Diovan,  Elevated LFTs. Negative hepatitis panel.  Could be secondary to myositis. . Normocytic anemia Latest hemoglobin of 10.7 and is stable.  No evidence of bleeding.  Morbid obesity Would benefit from weight loss as outpatient.  DVT Prophylaxis: Lovenox subcu  Code Status: Full code  Family Communication:  None today  Status is: Inpatient  Remains inpatient appropriate because Inpatient level of care appropriate due to severity of illness, status post muscle biopsy, need for rehabilitation  Dispo: The patient is from: Home              Anticipated d/c is to: skilled nursing facility as per PT recommendation.  Patient has decided to go for skilled nursing facility          Patient currently is not medically stable to d/c.   Difficult to place patient No  Consultants:  Neurology General surgery  Procedures:  Muscle biopsy on 11/27/2020  Antibiotics: None  Medications:  Scheduled: . amLODipine  5 mg Oral Daily  . enoxaparin (LOVENOX) injection  40 mg Subcutaneous Q24H  . gabapentin  600 mg Oral TID  . insulin aspart  0-15 Units Subcutaneous TID WC  . insulin aspart  0-5 Units Subcutaneous QHS  . insulin aspart protamine- aspart  22 Units Subcutaneous BID WC  . irbesartan  150 mg Oral Daily  . montelukast  10 mg Oral QHS  . oxymetazoline  1 spray Each Nare BID  . pantoprazole  40 mg Oral Daily  . polyethylene glycol  17 g Oral Daily   Continuous:  DXI:[id number] **  OR** acetaminophen, albuterol, ALPRAZolam, guaiFENesin, ondansetron **OR** ondansetron (ZOFRAN) IV, oxyCODONE-acetaminophen, simethicone, sodium chloride   Subjective: Today, patient was seen and examined at bedside.  Patient complains of mild leg pain.  Concerned about going home versus skilled nursing facility.  Objective:  Vital  Signs  Vitals:   11/29/20 0640 11/29/20 1718 11/29/20 2049 11/30/20 0519  BP: (!) 157/73 (!) 119/59 (!) 148/92 (!) 142/75  Pulse: 100 (!) 104 (!) 104 (!) 101  Resp: 18 18 16 16   Temp: 98.2 F (36.8 C) 98.1 F (36.7 C) 98.2 F (36.8 C) 98.2 F (36.8 C)  TempSrc: Oral Oral Oral Oral  SpO2: 100% 96% 100% 99%    Intake/Output Summary (Last 24 hours) at 11/30/2020 1305 Last data filed at 11/30/2020 1000 Gross per 24 hour  Intake 480 ml  Output 0 ml  Net 480 ml   Filed Weights   Physical exam General: Obese built, not in obvious distress HENT:   No scleral pallor or icterus noted. Oral mucosa is moist.  Chest:  Clear breath sounds.  Diminished breath sounds bilaterally. No crackles or wheezes.  CVS: S1 &S2 heard. No murmur.  Regular rate and rhythm. Abdomen: Soft, nontender, nondistended.  Bowel sounds are heard.   Extremities: No cyanosis, clubbing or edema.  Peripheral pulses are palpable.  Surgical biopsy site healthy Psych: Alert, awake and oriented, normal mood CNS:  No cranial nerve deficits.  Moving all extremities Skin: Warm and dry.  No rashes noted.   Lab Results:  Data Reviewed: I have personally reviewed the following labs and imaging studies  CBC: Recent Labs  Lab 11/24/20 2052 11/25/20 0653 11/26/20 0506 11/27/20 0447 11/28/20 0447 11/29/20 0445 11/30/20 0610  WBC 6.9   < > 7.2 7.3 12.8* 10.3 9.3  NEUTROABS 4.3  --   --   --   --   --   --   HGB 11.1*   < > 10.5* 11.1* 10.2* 10.5* 10.7*  HCT 36.5   < > 35.1* 36.5 33.2* 34.8* 35.2*  MCV 82.0   < > 83.2 81.8 80.6 83.1 81.9  PLT 327   < > 292 314 323 328 366   < > = values in this interval not displayed.    Basic Metabolic Panel: Recent Labs  Lab 11/25/20 0100 11/25/20 0653 11/26/20 0506 11/27/20 0447 11/28/20 0447 11/29/20 0445 11/30/20 0610  NA  --    < > 136 135 133* 136 138  K  --    < > 4.2 4.5 4.0 4.2 4.0  CL  --    < > 105 102 101 105 104  CO2  --    < > 24 27 24 24 26   GLUCOSE  --     < > 196* 263* 339* 238* 141*  BUN  --    < > 12 12 20 17 18   CREATININE  --    < > 0.39* 0.44 0.44 0.51 0.47  CALCIUM  --    < > 8.9 9.1 8.9 8.9 9.2  MG 2.0  --   --   --  1.9 2.0  --   PHOS 4.4  --   --   --  3.4 4.0  --    < > = values in this interval not displayed.    GFR: CrCl cannot be calculated (Unknown ideal weight.).  Liver Function Tests: Recent Labs  Lab 11/26/20 0506 11/27/20 0447 11/28/20 0447 11/29/20 0445 11/30/20 0610  AST 84* 82* 74* 83*  78*  ALT 167* 162* 144* 141* 135*  ALKPHOS 53 54 52 52 51  BILITOT 0.6 0.4 0.5 0.3 0.7  PROT 6.3* 6.5 6.6 6.6 6.7  ALBUMIN 3.2* 3.2* 3.2* 3.3* 3.3*     Cardiac Enzymes: Recent Labs  Lab 11/24/20 2052 11/29/20 0445  CKTOTAL 7,517* 2,578*     HbA1C: No results for input(s): HGBA1C in the last 72 hours.  CBG: Recent Labs  Lab 11/29/20 1721 11/29/20 1937 11/29/20 2039 11/30/20 0734 11/30/20 1223  GLUCAP 297* 343* 340* 125* 171*     Recent Results (from the past 240 hour(s))  Resp Panel by RT-PCR (Flu A&B, Covid) Nasopharyngeal Swab     Status: None   Collection Time: 11/24/20 11:30 PM   Specimen: Nasopharyngeal Swab; Nasopharyngeal(NP) swabs in vial transport medium  Result Value Ref Range Status   SARS Coronavirus 2 by RT PCR NEGATIVE NEGATIVE Final    Comment: (NOTE) SARS-CoV-2 target nucleic acids are NOT DETECTED.  The SARS-CoV-2 RNA is generally detectable in upper respiratory specimens during the acute phase of infection. The lowest concentration of SARS-CoV-2 viral copies this assay can detect is 138 copies/mL. A negative result does not preclude SARS-Cov-2 infection and should not be used as the sole basis for treatment or other patient management decisions. A negative result may occur with  improper specimen collection/handling, submission of specimen other than nasopharyngeal swab, presence of viral mutation(s) within the areas targeted by this assay, and inadequate number of  viral copies(<138 copies/mL). A negative result must be combined with clinical observations, patient history, and epidemiological information. The expected result is Negative.  Fact Sheet for Patients:  EntrepreneurPulse.com.au  Fact Sheet for Healthcare Providers:  IncredibleEmployment.be  This test is no t yet approved or cleared by the [place] FDA and  has been authorized for detection and/or diagnosis of SARS-CoV-2 by FDA under an Emergency Use Authorization (EUA). This EUA will remain  in effect (meaning this test can be used) for the duration of the COVID-19 declaration under Section 564(b)(1) of the Act, 21 U.S.C.section 360bbb-3(b)(1), unless the authorization is terminated  or revoked sooner.       Influenza A by PCR NEGATIVE NEGATIVE Final   Influenza B by PCR NEGATIVE NEGATIVE Final    Comment: (NOTE) The Xpert Xpress SARS-CoV-2/FLU/RSV plus assay is intended as an aid in the diagnosis of influenza from Nasopharyngeal swab specimens and should not be used as a sole basis for treatment. Nasal washings and aspirates are unacceptable for Xpert Xpress SARS-CoV-2/FLU/RSV testing.  Fact Sheet for Patients: EntrepreneurPulse.com.au  Fact Sheet for Healthcare Providers: IncredibleEmployment.be  This test is not yet approved or cleared by the [place] FDA and has been authorized for detection and/or diagnosis of SARS-CoV-2 by FDA under an Emergency Use Authorization (EUA). This EUA will remain in effect (meaning this test can be used) for the duration of the COVID-19 declaration under Section 564(b)(1) of the Act, 21 U.S.C. section 360bbb-3(b)(1), unless the authorization is terminated or revoked.  Performed at [facility], [place] [address], [place], [place] [address]   MRSA PCR Screening     Status: None   Collection Time: 11/27/20  5:23 AM   Specimen: Nasal Mucosa;  Nasopharyngeal  Result Value Ref Range Status   MRSA by PCR NEGATIVE NEGATIVE Final    Comment:        The GeneXpert MRSA Assay (FDA approved for NASAL specimens only), is one component of a comprehensive MRSA colonization surveillance program. It is  not intended to diagnose MRSA infection nor to guide or monitor treatment for MRSA infections. Performed at [facility], [place] [address], [place], [place] [address]       Radiology Studies: No results found.    LOS: 5 days   [place] [employer] operational officer on [email or web address] 11/30/2020, 1:05 PM

## 2020-12-01 DIAGNOSIS — M609 Myositis, unspecified: Secondary | ICD-10-CM | POA: Diagnosis not present

## 2020-12-01 DIAGNOSIS — I10 Essential (primary) hypertension: Secondary | ICD-10-CM | POA: Diagnosis not present

## 2020-12-01 DIAGNOSIS — E1169 Type 2 diabetes mellitus with other specified complication: Secondary | ICD-10-CM | POA: Diagnosis not present

## 2020-12-01 DIAGNOSIS — Z794 Long term (current) use of insulin: Secondary | ICD-10-CM | POA: Diagnosis not present

## 2020-12-01 LAB — GLUCOSE, CAPILLARY
Glucose-Capillary: 271 mg/dL — ABNORMAL HIGH (ref 70–99)
Glucose-Capillary: 166 mg/dL — ABNORMAL HIGH (ref 70–99)
Glucose-Capillary: 251 mg/dL — ABNORMAL HIGH (ref 70–99)
Glucose-Capillary: 311 mg/dL — ABNORMAL HIGH (ref 70–99)

## 2020-12-01 MED ORDER — GABAPENTIN 300 MG PO CAPS
600.0000 mg | ORAL_CAPSULE | Freq: Three times a day (TID) | ORAL | Status: DC
  Administered 2020-12-01 – 2020-12-04 (×13): 600 mg via ORAL
  Filled 2020-12-01 (×13): qty 2

## 2020-12-01 NOTE — Progress Notes (Signed)
[facility] PROGRESS NOTE   Kristine Bauer WFU:[id number] DOB: 05/22/1966 DOA: 11/24/2020  PCP: Nicolette Bang, DO  Brief narrative 55 y.o. female with past medical history of  myositis, insulin-dependent type 2 diabetes, hypertension, diastolic CHF  presented to the hospital with complaints of difficulty getting up from the toilet due to bilateral lower extremity weakness.  Patient had been having bilateral lower extremity weakness for around 2 years and was seen by neurology as outpatient.  She was offered muscle biopsy but she had declined biopsy as outpatient. Patient presented  this time and  she wished to proceed with biopsy.  During hospitalization, patient underwent biopsy by surgical team on 11/27/2020.  Physical therapy saw the patient and recommended rehabilitation.  Assessment/Plan:  Bilateral lower extremity weakness with  history of myositis Physical therapy recommended skilled nursing facility placement on discharge.  Patient does have history of myositis in the past but no biopsy was done in the past..  MRI of the thoracic and lumbar spine without any significant findings.  Seen by neurology.  Patient underwent muscle biopsy on 11/27/2020 by general surgery.  Biopsy pending.  Continue gabapentin, oxycodone for pain.  Will increase her gabapentin dose today for neuropathy.  Apical chest pain Troponins negative.  EKG unremarkable.  Possible acid reflux.  Continue Protonix daily, asymptomatic at this time  Sinus tachycardia most likely due to anxiety.  Improved.  Mild.  Insulin-dependent diabetes mellitus type 2, poorly controlled Latest hemoglobin A1c of 12.5.  On Lantus, Metformin, sulfonylurea at home.  Was not really taking her Lantus due to financial issues.  Diabetic coordinator was consulted and recommended Novolin RelioOn 70/30 insulin pen on discharge.  Continue sliding-scale insulin, Accu-Cheks, diabetic diet,.  Started 70/30 insulin since  11/29/2020. Closely monitor blood glucose levels.  Latest POC glucose of 271.  Essential hypertension Continue amlodipine, hydrochlorothiazide and Diovan,  Elevated LFTs. Negative hepatitis panel.  Could be secondary to myositis. . Normocytic anemia Latest hemoglobin of 10.7 and is stable.  No evidence of bleeding.  Morbid obesity Would benefit from weight loss as outpatient.  DVT Prophylaxis: Lovenox subcu  Code Status: Full code  Family Communication:  None today  Status is: Inpatient  Remains inpatient appropriate because Inpatient level of care appropriate due to severity of illness, status post muscle biopsy, need for rehabilitation  Dispo: The patient is from: Home              Anticipated d/c is to: skilled nursing facility as per PT recommendation.  Patient has decided to go for skilled nursing facility          Patient currently is not medically stable to d/c.   Difficult to place patient No  Consultants:  Neurology General surgery  Procedures:  Muscle biopsy on 11/27/2020  Antibiotics: None  Medications:  Scheduled: . amLODipine  5 mg Oral Daily  . enoxaparin (LOVENOX) injection  40 mg Subcutaneous Q24H  . gabapentin  600 mg Oral TID  . insulin aspart  0-15 Units Subcutaneous TID WC  . insulin aspart  0-5 Units Subcutaneous QHS  . insulin aspart protamine- aspart  22 Units Subcutaneous BID WC  . irbesartan  150 mg Oral Daily  . montelukast  10 mg Oral QHS  . oxymetazoline  1 spray Each Nare BID  . pantoprazole  40 mg Oral Daily  . polyethylene glycol  17 g Oral Daily   Continuous:  KGU:RKYHCWCBJSEGB **OR** acetaminophen, albuterol, ALPRAZolam, guaiFENesin, ondansetron **OR** ondansetron (ZOFRAN) IV, oxyCODONE-acetaminophen,  simethicone, sodium chloride   Subjective: Today, patient was seen and examined at bedside.  Still complains of a leg pain and requests gabapentin/Lyrica.  Patient is willing to go to skilled nursing facility at this time.  She  especially has difficulty climbing stairs and standing up from sitting down position.  Has ambulated some with physical therapy  Objective:  Vital Signs  Vitals:   11/30/20 1421 11/30/20 1708 11/30/20 2102 12/01/20 0523  BP: (!) 186/69 (!) 164/70 123/77 131/62  Pulse: 96 (!) 102 (!) 102 82  Resp:  16 16 16   Temp: 97.6 F (36.4 C) (!) 97.5 F (36.4 C) 98 F (36.7 C) 98.2 F (36.8 C)  TempSrc: Oral Oral Oral Oral  SpO2: 100% 100% 99% 99%    Intake/Output Summary (Last 24 hours) at 12/01/2020 1105 Last data filed at 12/01/2020 1275 Gross per 24 hour  Intake 240 ml  Output 0 ml  Net 240 ml   Filed Weights   Physical exam General: Obese built, not in obvious distress HENT:   No scleral pallor or icterus noted. Oral mucosa is moist.  Chest:  Clear breath sounds.  Diminished breath sounds bilaterally. No crackles or wheezes.  CVS: S1 &S2 heard. No murmur.  Regular rate and rhythm. Abdomen: Soft, nontender, nondistended.  Bowel sounds are heard.   Extremities: No cyanosis, clubbing or edema.  Peripheral pulses are palpable.  Surgical site healthy Psych: Alert, awake and oriented, normal mood CNS:  No cranial nerve deficits.  Power equal in all extremities.   Skin: Warm and dry.  No rashes noted.   Lab Results:  Data Reviewed: I have personally reviewed the following labs and imaging studies  CBC: Recent Labs  Lab 11/24/20 2052 11/25/20 0653 11/26/20 0506 11/27/20 0447 11/28/20 0447 11/29/20 0445 11/30/20 0610  WBC 6.9   < > 7.2 7.3 12.8* 10.3 9.3  NEUTROABS 4.3  --   --   --   --   --   --   HGB 11.1*   < > 10.5* 11.1* 10.2* 10.5* 10.7*  HCT 36.5   < > 35.1* 36.5 33.2* 34.8* 35.2*  MCV 82.0   < > 83.2 81.8 80.6 83.1 81.9  PLT 327   < > 292 314 323 328 366   < > = values in this interval not displayed.    Basic Metabolic Panel: Recent Labs  Lab 11/25/20 0100 11/25/20 0653 11/26/20 0506 11/27/20 0447 11/28/20 0447 11/29/20 0445 11/30/20 0610  NA  --    <  > 136 135 133* 136 138  K  --    < > 4.2 4.5 4.0 4.2 4.0  CL  --    < > 105 102 101 105 104  CO2  --    < > 24 27 24 24 26   GLUCOSE  --    < > 196* 263* 339* 238* 141*  BUN  --    < > 12 12 20 17 18   CREATININE  --    < > 0.39* 0.44 0.44 0.51 0.47  CALCIUM  --    < > 8.9 9.1 8.9 8.9 9.2  MG 2.0  --   --   --  1.9 2.0  --   PHOS 4.4  --   --   --  3.4 4.0  --    < > = values in this interval not displayed.    GFR: CrCl cannot be calculated (Unknown ideal weight.).  Liver Function Tests: Recent Labs  Lab 11/26/20 0506 11/27/20 0447 11/28/20 0447 11/29/20 0445 11/30/20 0610  AST 84* 82* 74* 83* 78*  ALT 167* 162* 144* 141* 135*  ALKPHOS 53 54 52 52 51  BILITOT 0.6 0.4 0.5 0.3 0.7  PROT 6.3* 6.5 6.6 6.6 6.7  ALBUMIN 3.2* 3.2* 3.2* 3.3* 3.3*     Cardiac Enzymes: Recent Labs  Lab 11/24/20 2052 11/29/20 0445  CKTOTAL 7,517* 2,578*     HbA1C: No results for input(s): HGBA1C in the last 72 hours.  CBG: Recent Labs  Lab 11/30/20 0734 11/30/20 1223 11/30/20 1630 11/30/20 2104 12/01/20 0741  GLUCAP 125* 171* 148* 183* 271*     Recent Results (from the past 240 hour(s))  Resp Panel by RT-PCR (Flu A&B, Covid) Nasopharyngeal Swab     Status: None   Collection Time: 11/24/20 11:30 PM   Specimen: Nasopharyngeal Swab; Nasopharyngeal(NP) swabs in vial transport medium  Result Value Ref Range Status   SARS Coronavirus 2 by RT PCR NEGATIVE NEGATIVE Final    Comment: (NOTE) SARS-CoV-2 target nucleic acids are NOT DETECTED.  The SARS-CoV-2 RNA is generally detectable in upper respiratory specimens during the acute phase of infection. The lowest concentration of SARS-CoV-2 viral copies this assay can detect is 138 copies/mL. A negative result does not preclude SARS-Cov-2 infection and should not be used as the sole basis for treatment or other patient management decisions. A negative result may occur with  improper specimen collection/handling, submission of specimen  other than nasopharyngeal swab, presence of viral mutation(s) within the areas targeted by this assay, and inadequate number of viral copies(<138 copies/mL). A negative result must be combined with clinical observations, patient history, and epidemiological information. The expected result is Negative.  Fact Sheet for Patients:  EntrepreneurPulse.com.au  Fact Sheet for Healthcare Providers:  IncredibleEmployment.be  This test is no t yet approved or cleared by the [place] FDA and  has been authorized for detection and/or diagnosis of SARS-CoV-2 by FDA under an Emergency Use Authorization (EUA). This EUA will remain  in effect (meaning this test can be used) for the duration of the COVID-19 declaration under Section 564(b)(1) of the Act, 21 U.S.C.section 360bbb-3(b)(1), unless the authorization is terminated  or revoked sooner.       Influenza A by PCR NEGATIVE NEGATIVE Final   Influenza B by PCR NEGATIVE NEGATIVE Final    Comment: (NOTE) The Xpert Xpress SARS-CoV-2/FLU/RSV plus assay is intended as an aid in the diagnosis of influenza from Nasopharyngeal swab specimens and should not be used as a sole basis for treatment. Nasal washings and aspirates are unacceptable for Xpert Xpress SARS-CoV-2/FLU/RSV testing.  Fact Sheet for Patients: EntrepreneurPulse.com.au  Fact Sheet for Healthcare Providers: IncredibleEmployment.be  This test is not yet approved or cleared by the [place] FDA and has been authorized for detection and/or diagnosis of SARS-CoV-2 by FDA under an Emergency Use Authorization (EUA). This EUA will remain in effect (meaning this test can be used) for the duration of the COVID-19 declaration under Section 564(b)(1) of the Act, 21 U.S.C. section 360bbb-3(b)(1), unless the authorization is terminated or revoked.  Performed at [facility], [place] [address], [place], [place] [address]   MRSA PCR Screening     Status: None   Collection Time: 11/27/20  5:23 AM   Specimen: Nasal Mucosa; Nasopharyngeal  Result Value Ref Range Status   MRSA by PCR NEGATIVE NEGATIVE Final    Comment:        The GeneXpert MRSA Assay (FDA  approved for NASAL specimens only), is one component of a comprehensive MRSA colonization surveillance program. It is not intended to diagnose MRSA infection nor to guide or monitor treatment for MRSA infections. Performed at [facility], [place] [address] [place], [place] [address]       Radiology Studies: No results found.    LOS: 6 days   [place] [facility] operational officer on [email or web address] 12/01/2020, 11:05 AM

## 2020-12-02 ENCOUNTER — Telehealth: Payer: Self-pay | Admitting: Neurology

## 2020-12-02 DIAGNOSIS — I10 Essential (primary) hypertension: Secondary | ICD-10-CM | POA: Diagnosis not present

## 2020-12-02 DIAGNOSIS — Z794 Long term (current) use of insulin: Secondary | ICD-10-CM | POA: Diagnosis not present

## 2020-12-02 DIAGNOSIS — E1169 Type 2 diabetes mellitus with other specified complication: Secondary | ICD-10-CM | POA: Diagnosis not present

## 2020-12-02 DIAGNOSIS — M609 Myositis, unspecified: Secondary | ICD-10-CM | POA: Diagnosis not present

## 2020-12-02 LAB — GLUCOSE, CAPILLARY
Glucose-Capillary: 242 mg/dL — ABNORMAL HIGH (ref 70–99)
Glucose-Capillary: 293 mg/dL — ABNORMAL HIGH (ref 70–99)
Glucose-Capillary: 312 mg/dL — ABNORMAL HIGH (ref 70–99)

## 2020-12-02 MED ORDER — CIPROFLOXACIN-DEXAMETHASONE 0.3-0.1 % OT SUSP
4.0000 [drp] | Freq: Two times a day (BID) | OTIC | Status: DC
  Administered 2020-12-02 – 2020-12-04 (×4): 4 [drp] via OTIC
  Filled 2020-12-02: qty 7.5

## 2020-12-02 MED ORDER — INSULIN ASPART PROT & ASPART (70-30 MIX) 100 UNIT/ML ~~LOC~~ SUSP
25.0000 [IU] | Freq: Two times a day (BID) | SUBCUTANEOUS | Status: DC
  Administered 2020-12-02 – 2020-12-04 (×4): 25 [IU] via SUBCUTANEOUS

## 2020-12-02 NOTE — Progress Notes (Signed)
Inpatient Diabetes Program Recommendations  AACE/ADA: New Consensus Statement on Inpatient Glycemic Control (2015)  Target Ranges:  Prepandial:   less than 140 mg/dL      Peak postprandial:   less than 180 mg/dL (1-2 hours)      Critically ill patients:  140 - 180 mg/dL   Lab Results  Component Value Date   GLUCAP 311 (H) 12/01/2020   HGBA1C 12.5 (H) 11/25/2020    Review of Glycemic Control Results for Kristine Bauer, Kristine Bauer (MRN [id number]) as of 12/02/2020 10:12  Ref. Range 11/30/2020 21:04 12/01/2020 07:41 12/01/2020 11:37 12/01/2020 16:32 12/01/2020 21:26  Glucose-Capillary Latest Ref Range: 70 - 99 mg/dL 183 (H) 271 (H) 166 (H) 251 (H) 311 (H)   Diabetes history: DM 2 Outpatient Diabetes medications:  Amaryl 8 mg daily Lantus 10 units daily Metformin 1500 mg bid Current orders for Inpatient glycemic control:  Novolog mix 70/30 22 units bid Novolog moderate tid with meals and HS Inpatient Diabetes Program Recommendations:    Please consider increasing Novolog 70/30 to 25 units bid.   Thanks  Adah Perl, RN, BC-ADM Inpatient Diabetes Coordinator Pager [phone] (8a-5p)

## 2020-12-02 NOTE — Progress Notes (Signed)
Physical Therapy Treatment Patient Details Name: Kristine Bauer MRN: [id number] DOB: October 22, 1965 Today's Date: 12/02/2020    History of Present Illness 55 yo female admitted with LE myositis. Hx of irritable myopathy, myositis, DM, CHF, obesity, neuropathy, asthma, falls    PT Comments    Patient reports noting difficulty  With standing up from bed and toilet, requires UE to pull/push up. Patient with noted decreased control of descent.  Encouraged patient to use crocks not fipflops for safety during ambulation.  Patient ambulated x 125' using cane, gait is slow, decreased stride length.   Patient performed Blue theraband resistive Leg exercises seated. Patient  Performed partial sit to stand but is unable to  Control descent and power up so patient only able to perform  Minimal range  For partial sit to stand. Patient asking questions as to shy she has been having difficulty with  Negotiating steps and standing from low surfaces.  continue PT for  Safe mobility.  Follow Up Recommendations  SNF     Equipment Recommendations  Rolling walker with 5" wheels;3in1 (PT)    Recommendations for Other Services       Precautions / Restrictions Precautions Precautions: Fall    Mobility  Bed Mobility Overal bed mobility: Independent                  Transfers Overall transfer level: Needs assistance Equipment used: Straight cane Transfers: Sit to/from Stand Sit to Stand: Modified independent (Device/Increase time)         General transfer comment: extra effort to push up, use of rail, decreased control of descent  Ambulation/Gait Ambulation/Gait assistance: Supervision Gait Distance (Feet): 125 Feet Assistive device: Straight cane Gait Pattern/deviations: Step-through pattern;Decreased stride length Gait velocity: decreased   General Gait Details: slow cadence, definite UE support needed   Stairs             Wheelchair Mobility    Modified Rankin  (Stroke Patients Only)       Balance Overall balance assessment: History of Falls   Sitting balance-Leahy Scale: Good     Standing balance support: No upper extremity supported;During functional activity Standing balance-Leahy Scale: Poor Standing balance comment: Reliant on one UE on cane or rail                            Cognition Arousal/Alertness: Awake/alert                                            Exercises Other Exercises Other Exercises: blue Theraband resistive hip and knee extension seated on bed edge x 10 reps Other Exercises: repeated sit to stand  x 5    General Comments        Pertinent Vitals/Pain Faces Pain Scale: Hurts even more Pain Location: L LE, near bx site Pain Descriptors / Indicators: Discomfort;Sore Pain Intervention(s): Monitored during session    Home Living                      Prior Function            PT Goals (current goals can now be found in the care plan section) Progress towards PT goals: Progressing toward goals    Frequency    Min 3X/week      PT Plan Current plan remains  appropriate    Co-evaluation              AM-PAC PT "6 Clicks" Mobility   Outcome Measure  Help needed turning from your back to your side while in a flat bed without using bedrails?: None Help needed moving from lying on your back to sitting on the side of a flat bed without using bedrails?: None Help needed moving to and from a bed to a chair (including a wheelchair)?: A Little Help needed standing up from a chair using your arms (e.g., wheelchair or bedside chair)?: A Little Help needed to walk in hospital room?: A Little Help needed climbing 3-5 steps with a railing? : A Lot 6 Click Score: 19    End of Session Equipment Utilized During Treatment: Gait belt Activity Tolerance: Patient tolerated treatment well Patient left: in bed;with call bell/phone within reach (seated on bed edge) Nurse  Communication: Mobility status PT Visit Diagnosis: Pain;Difficulty in walking, not elsewhere classified (R26.2);Muscle weakness (generalized) (M62.81) Pain - Right/Left: Left Pain - part of body: Leg     Time: 1165-7903 PT Time Calculation (min) (ACUTE ONLY): 28 min  Charges:  $Gait Training: 8-22 mins $Therapeutic Exercise: 8-22 mins                     Kristine Bauer PT Acute Rehabilitation Services Pager [phone] Office [phone]    Kristine Bauer 12/02/2020, 1:00 PM

## 2020-12-02 NOTE — Progress Notes (Signed)
[facility] PROGRESS NOTE   Kristine Bauer VHQ:[id number] DOB: 1965/10/30 DOA: 11/24/2020  PCP: Nicolette Bang, DO  Brief narrative 55 y.o. female with past medical history of  myositis, insulin-dependent type 2 diabetes, hypertension, diastolic CHF  presented to the hospital with complaints of difficulty getting up from the toilet due to bilateral lower extremity weakness.  Patient had been having bilateral lower extremity weakness for around 2 years and was seen by neurology as outpatient.  She was offered muscle biopsy but she had declined biopsy as outpatient. Patient presented  this time and  she wished to proceed with biopsy.  During hospitalization, patient underwent biopsy by surgical team on 11/27/2020.  Physical therapy saw the patient and recommended rehabilitation.  Assessment/Plan:  Bilateral lower extremity weakness with  history of myositis MRI of the thoracic and lumbar spine without any significant findings.  Seen by neurology.  Patient underwent muscle biopsy on 11/27/2020 by general surgery.  Biopsy pending.  Continue gabapentin, oxycodone for pain.  Dose of gabapentin was changed to 600 4 times daily since yesterday.  Ear discomfort/itching. We will add Cipro/steroid drop.  Apical chest pain Resolved at this time.  Continue Protonix could be GERD  Sinus tachycardia mild.  Monitor.  Insulin-dependent diabetes mellitus type 2, poorly controlled Latest hemoglobin A1c of 12.5.  On Lantus, Metformin, sulfonylurea at home.  Was not really taking her Lantus due to financial issues.  Diabetic coordinator was consulted and recommended Novolin RelioOn 70/30 insulin pen on discharge.  Continue sliding-scale insulin, Accu-Cheks, diabetic diet,.  Started 70/30 insulin since 11/29/2020. Closely monitor blood glucose levels.  Latest POC glucose of 311.  Likely due to drinks.  Essential hypertension Continue amlodipine, hydrochlorothiazide and Diovan, latest  blood pressure 154/83  Elevated LFTs. Negative hepatitis panel.  Elevated LFT due to myositis. . Normocytic anemia Latest hemoglobin of 10.7 and is stable.  No evidence of bleeding.  Morbid obesity Would benefit from weight loss as outpatient.  DVT Prophylaxis: Lovenox subcu  Code Status: Full code  Family Communication:  None today  Status is: Inpatient  Remains inpatient appropriate because Inpatient level of care appropriate due to severity of illness, status post muscle biopsy, need for rehabilitation  Dispo: The patient is from: Home              Anticipated d/c is to: skilled nursing facility as per PT recommendation.            Patient currently is medically stable to d/c.   Difficult to place patient No  Consultants:  Neurology General surgery  Procedures:  Muscle biopsy on 11/27/2020  Antibiotics: None  Medications:  Scheduled: . amLODipine  5 mg Oral Daily  . enoxaparin (LOVENOX) injection  40 mg Subcutaneous Q24H  . gabapentin  600 mg Oral TID WC & HS  . insulin aspart  0-15 Units Subcutaneous TID WC  . insulin aspart  0-5 Units Subcutaneous QHS  . insulin aspart protamine- aspart  22 Units Subcutaneous BID WC  . irbesartan  150 mg Oral Daily  . montelukast  10 mg Oral QHS  . oxymetazoline  1 spray Each Nare BID  . pantoprazole  40 mg Oral Daily  . polyethylene glycol  17 g Oral Daily   Continuous:  UXL:[id number] **OR** acetaminophen, albuterol, ALPRAZolam, guaiFENesin, ondansetron **OR** ondansetron (ZOFRAN) IV, oxyCODONE-acetaminophen, simethicone, sodium chloride   Subjective: Today, patient was seen and examined at bedside.  Patient still complains of mild leg pain and itching in the  ears.  Objective:  Vital Signs  Vitals:   12/01/20 0523 12/01/20 1352 12/01/20 2123 12/02/20 0604  BP: 131/62 (!) 170/78 (!) 158/89 (!) 154/83  Pulse: 82 92 (!) 102 (!) 104  Resp: 16 18 18 18   Temp: 98.2 F (36.8 C) 98 F (36.7 C) 97.7 F (36.5 C)  98.5 F (36.9 C)  TempSrc: Oral Oral Oral Oral  SpO2: 99% 99% 100% 100%    Intake/Output Summary (Last 24 hours) at 12/02/2020 0805 Last data filed at 12/02/2020 6389 Gross per 24 hour  Intake 960 ml  Output 0 ml  Net 960 ml   Filed Weights   Physical exam General: Obese built, not in obvious distress HENT:   No scleral pallor or icterus noted. Oral mucosa is moist.  Ears with mild whitish discoloration along the ear canal. Chest:  Clear breath sounds.  Diminished breath sounds bilaterally. No crackles or wheezes.  CVS: S1 &S2 heard. No murmur.  Regular rate and rhythm. Abdomen: Soft, nontender, nondistended.  Bowel sounds are heard.   Extremities: No cyanosis, clubbing or edema.  Peripheral pulses are palpable.  Surgical site healthy Psych: Alert, awake and oriented, normal mood CNS:  No cranial nerve deficits.  Power equal in all extremities.   Skin: Warm and dry.  No rashes noted.  Lab Results:  Data Reviewed: I have personally reviewed the following labs and imaging studies  CBC: Recent Labs  Lab 11/26/20 0506 11/27/20 0447 11/28/20 0447 11/29/20 0445 11/30/20 0610  WBC 7.2 7.3 12.8* 10.3 9.3  HGB 10.5* 11.1* 10.2* 10.5* 10.7*  HCT 35.1* 36.5 33.2* 34.8* 35.2*  MCV 83.2 81.8 80.6 83.1 81.9  PLT 292 314 323 328 373    Basic Metabolic Panel: Recent Labs  Lab 11/26/20 0506 11/27/20 0447 11/28/20 0447 11/29/20 0445 11/30/20 0610  NA 136 135 133* 136 138  K 4.2 4.5 4.0 4.2 4.0  CL 105 102 101 105 104  CO2 24 27 24 24 26   GLUCOSE 196* 263* 339* 238* 141*  BUN 12 12 20 17 18   CREATININE 0.39* 0.44 0.44 0.51 0.47  CALCIUM 8.9 9.1 8.9 8.9 9.2  MG  --   --  1.9 2.0  --   PHOS  --   --  3.4 4.0  --     GFR: CrCl cannot be calculated (Unknown ideal weight.).  Liver Function Tests: Recent Labs  Lab 11/26/20 0506 11/27/20 0447 11/28/20 0447 11/29/20 0445 11/30/20 0610  AST 84* 82* 74* 83* 78*  ALT 167* 162* 144* 141* 135*  ALKPHOS 53 54 52 52 51   BILITOT 0.6 0.4 0.5 0.3 0.7  PROT 6.3* 6.5 6.6 6.6 6.7  ALBUMIN 3.2* 3.2* 3.2* 3.3* 3.3*     Cardiac Enzymes: Recent Labs  Lab 11/29/20 0445  CKTOTAL 2,578*     HbA1C: No results for input(s): HGBA1C in the last 72 hours.  CBG: Recent Labs  Lab 11/30/20 2104 12/01/20 0741 12/01/20 1137 12/01/20 1632 12/01/20 2126  GLUCAP 183* 271* 166* 251* 311*     Recent Results (from the past 240 hour(s))  Resp Panel by RT-PCR (Flu A&B, Covid) Nasopharyngeal Swab     Status: None   Collection Time: 11/24/20 11:30 PM   Specimen: Nasopharyngeal Swab; Nasopharyngeal(NP) swabs in vial transport medium  Result Value Ref Range Status   SARS Coronavirus 2 by RT PCR NEGATIVE NEGATIVE Final    Comment: (NOTE) SARS-CoV-2 target nucleic acids are NOT DETECTED.  The SARS-CoV-2 RNA is generally detectable in  upper respiratory specimens during the acute phase of infection. The lowest concentration of SARS-CoV-2 viral copies this assay can detect is 138 copies/mL. A negative result does not preclude SARS-Cov-2 infection and should not be used as the sole basis for treatment or other patient management decisions. A negative result may occur with  improper specimen collection/handling, submission of specimen other than nasopharyngeal swab, presence of viral mutation(s) within the areas targeted by this assay, and inadequate number of viral copies(<138 copies/mL). A negative result must be combined with clinical observations, patient history, and epidemiological information. The expected result is Negative.  Fact Sheet for Patients:  EntrepreneurPulse.com.au  Fact Sheet for Healthcare Providers:  IncredibleEmployment.be  This test is no t yet approved or cleared by the [place] FDA and  has been authorized for detection and/or diagnosis of SARS-CoV-2 by FDA under an Emergency Use Authorization (EUA). This EUA will remain  in effect (meaning this  test can be used) for the duration of the COVID-19 declaration under Section 564(b)(1) of the Act, 21 U.S.C.section 360bbb-3(b)(1), unless the authorization is terminated  or revoked sooner.       Influenza A by PCR NEGATIVE NEGATIVE Final   Influenza B by PCR NEGATIVE NEGATIVE Final    Comment: (NOTE) The Xpert Xpress SARS-CoV-2/FLU/RSV plus assay is intended as an aid in the diagnosis of influenza from Nasopharyngeal swab specimens and should not be used as a sole basis for treatment. Nasal washings and aspirates are unacceptable for Xpert Xpress SARS-CoV-2/FLU/RSV testing.  Fact Sheet for Patients: EntrepreneurPulse.com.au  Fact Sheet for Healthcare Providers: IncredibleEmployment.be  This test is not yet approved or cleared by the [place] FDA and has been authorized for detection and/or diagnosis of SARS-CoV-2 by FDA under an Emergency Use Authorization (EUA). This EUA will remain in effect (meaning this test can be used) for the duration of the COVID-19 declaration under Section 564(b)(1) of the Act, 21 U.S.C. section 360bbb-3(b)(1), unless the authorization is terminated or revoked.  Performed at [facility], [place] [address], [place], [place] [address]   MRSA PCR Screening     Status: None   Collection Time: 11/27/20  5:23 AM   Specimen: Nasal Mucosa; Nasopharyngeal  Result Value Ref Range Status   MRSA by PCR NEGATIVE NEGATIVE Final    Comment:        The GeneXpert MRSA Assay (FDA approved for NASAL specimens only), is one component of a comprehensive MRSA colonization surveillance program. It is not intended to diagnose MRSA infection nor to guide or monitor treatment for MRSA infections. Performed at [facility], [place] [address], [place], [place] [address]       Radiology Studies: No results found.    LOS: 7 days   Mayte Diers,MD [facility] 12/02/2020, 8:05  AM

## 2020-12-02 NOTE — TOC Progression Note (Signed)
Transition of Care [facility]) - Progression Note   Patient Details  Name: Kristine Bauer MRN: [id number] Date of Birth: September 27, 1965  Transition of Care [facility]) CM/SW [place], LCSW Phone Number: 12/02/2020, 2:26 PM  Clinical Narrative: CSW left two VMs for [facility] with [facility] to follow up regarding insurance authorization. CSW called [facility] and spoke with Maudie Mercury. Per Maudie Mercury, there was no progress with the patient's insurance authorization and she will follow up with Claiborne Billings who was working on the authorization. Updated PT note has been completed. TOC awaiting insurance authorization.  Expected Discharge Plan: [place] Barriers to Discharge: Continued Medical Work up  Expected Discharge Plan and Services Expected Discharge Plan: [place] In-house Referral: Clinical Social Work Post Acute Care Choice: [place] Living arrangements for the past 2 months: Apartment  Readmission Risk Interventions No flowsheet data found.

## 2020-12-03 DIAGNOSIS — I10 Essential (primary) hypertension: Secondary | ICD-10-CM | POA: Diagnosis not present

## 2020-12-03 DIAGNOSIS — E1169 Type 2 diabetes mellitus with other specified complication: Secondary | ICD-10-CM | POA: Diagnosis not present

## 2020-12-03 DIAGNOSIS — Z794 Long term (current) use of insulin: Secondary | ICD-10-CM | POA: Diagnosis not present

## 2020-12-03 DIAGNOSIS — M609 Myositis, unspecified: Secondary | ICD-10-CM | POA: Diagnosis not present

## 2020-12-03 LAB — GLUCOSE, CAPILLARY
Glucose-Capillary: 284 mg/dL — ABNORMAL HIGH (ref 70–99)
Glucose-Capillary: 198 mg/dL — ABNORMAL HIGH (ref 70–99)
Glucose-Capillary: 208 mg/dL — ABNORMAL HIGH (ref 70–99)
Glucose-Capillary: 213 mg/dL — ABNORMAL HIGH (ref 70–99)
Glucose-Capillary: 333 mg/dL — ABNORMAL HIGH (ref 70–99)

## 2020-12-03 MED ORDER — CYCLOBENZAPRINE HCL 5 MG PO TABS: 5.0000 mg | ORAL_TABLET | Freq: Three times a day (TID) | ORAL | Status: DC | PRN

## 2020-12-03 MED ORDER — FLUTICASONE PROPIONATE 50 MCG/ACT NA SUSP
1.0000 | Freq: Every day | NASAL | Status: DC
  Administered 2020-12-03 – 2020-12-04 (×2): 1 via NASAL
  Filled 2020-12-03: qty 16

## 2020-12-03 MED ORDER — ALBUTEROL SULFATE HFA 108 (90 BASE) MCG/ACT IN AERS: 1.0000 | INHALATION_SPRAY | Freq: Four times a day (QID) | RESPIRATORY_TRACT | Status: DC | PRN

## 2020-12-03 NOTE — Progress Notes (Signed)
Patient c/o pain at IV site. Noted catheter no longer in vein, shown above skin and bent. Removed. Site clean and dry, catheter intact.

## 2020-12-03 NOTE — TOC Progression Note (Signed)
Transition of Care [facility]) - Progression Note   Patient Details  Name: Kristine Bauer MRN: [id number] Date of Birth: 04/13/1966  Transition of Care [facility]) CM/SW [place], LCSW Phone Number: 12/03/2020, 3:08 PM  Clinical Narrative: CSW spoke with Carollee Leitz with [facility]. Per Ms. Mel Almond, insurance authorization should be completed by tomorrow if insurance does not deny the initial request as the patient walked 125 feet with PT. In the event the patient is not approved, the facility will take the patient if she is approved after a peer-to-peer. Hospitalist to order COVID test. [facility] awaiting insurance authorization.  Expected Discharge Plan: [place] Barriers to Discharge: Continued Medical Work up  Expected Discharge Plan and Services Expected Discharge Plan: [place] In-house Referral: Clinical Social Work Post Acute Care Choice: [place] Living arrangements for the past 2 months: Apartment  Readmission Risk Interventions No flowsheet data found.

## 2020-12-03 NOTE — Progress Notes (Addendum)
[facility] PROGRESS NOTE   Kristine Bauer WER:[id number] DOB: 1965-10-08 DOA: 11/24/2020  PCP: Nicolette Bang, DO  Brief narrative 55 y.o. female with past medical history of  myositis, insulin-dependent type 2 diabetes, hypertension, diastolic CHF  presented to the hospital with complaints of difficulty getting up from the toilet due to bilateral lower extremity weakness.  Patient had been having bilateral lower extremity weakness for around 2 years and was seen by neurology as outpatient.  She was offered muscle biopsy but she had declined biopsy as outpatient. Patient presented  this time increasing weakness and difficulty especially on climbing stairs and standing from sitting down position.  This admission  she wished to proceed with biopsy.  During hospitalization, patient underwent biopsy by surgical team on 11/27/2020.  Physical therapy saw the patient and recommended rehabilitation.  At this time, patient complains of mild pain on the lower extremities but basically waiting for insurance authorization and placement.  Biopsy pending as well.  Assessment/Plan:  Bilateral lower extremity weakness with  history of myositis MRI of the thoracic and lumbar spine without any significant findings.  Seen by neurology.  Patient underwent muscle biopsy on 11/27/2020 by general surgery.  Biopsy pathology pending.  I called pathology lab today to get further of this but I have been told that is still pending.  Continue gabapentin, oxycodone for pain.  Dose of gabapentin was changed to 600mg  4 times daily   Ear discomfort/itching. Added Cipro/steroid drop.  Apical chest pain Resolved at this time.  Continue Protonix could be GERD  Sinus tachycardia mild.  Monitor.  Insulin-dependent diabetes mellitus type 2, poorly controlled Latest hemoglobin A1c of 12.5.  On Lantus, Metformin, sulfonylurea at home.  Was not really taking her Lantus due to financial issues.  Diabetic  coordinator on board and recommendated Novolin RelioOn 70/30 insulin pen on discharge.  Continue sliding-scale insulin, Accu-Cheks, diabetic diet.  Started 70/30 insulin since 11/29/2020. Dose was adjusted yesterday. Closely monitor blood glucose levels.  Latest POC glucose of 198.   Essential hypertension Continue amlodipine, hydrochlorothiazide and Diovan, latest blood pressure 131/69.  Patient wishes to change her Diovan to some other medication as outpatient due to cost factor but wishes to continue if she will go to a skilled nursing facility.  Elevated LFTs. Negative hepatitis panel.  Elevated LFT due to myositis.  Check LFTs in a.m. . Normocytic anemia Latest hemoglobin of 10.7..  No evidence of bleeding.  Check CBC in a.m.  Morbid obesity Would benefit from weight loss as outpatient.  DVT Prophylaxis: Lovenox subcu  Code Status: Full code  Family Communication:  None.  Spoke with the patient in detail.  Status is: Inpatient  Remains inpatient appropriate because Inpatient level of care appropriate due to severity of illness, status post muscle biopsy, need for rehabilitation  Dispo: The patient is from: Home              Anticipated d/c is to: skilled nursing facility as per PT recommendation.  Awaiting for insurance authorization.          Patient currently is medically stable to d/c.   Difficult to place patient No  Consultants:  Neurology General surgery  Procedures:  Muscle biopsy on 11/27/2020  Antibiotics: None  Medications:  Scheduled: . amLODipine  5 mg Oral Daily  . ciprofloxacin-dexamethasone  4 drop Both EARS BID  . enoxaparin (LOVENOX) injection  40 mg Subcutaneous Q24H  . gabapentin  600 mg Oral TID WC & HS  .  insulin aspart  0-15 Units Subcutaneous TID WC  . insulin aspart  0-5 Units Subcutaneous QHS  . insulin aspart protamine- aspart  25 Units Subcutaneous BID WC  . irbesartan  150 mg Oral Daily  . montelukast  10 mg Oral QHS  . oxymetazoline   1 spray Each Nare BID  . pantoprazole  40 mg Oral Daily  . polyethylene glycol  17 g Oral Daily   Continuous:  NUU:[id number] **OR** acetaminophen, albuterol, ALPRAZolam, guaiFENesin, ondansetron **OR** ondansetron (ZOFRAN) IV, oxyCODONE-acetaminophen, simethicone, sodium chloride   Subjective: Today, patient was seen and examined at bedside.  Complains of mild leg pain itching in the ears.  Wishes to have fluticasone for her nasal allergies.  Objective:  Vital Signs  Vitals:   12/02/20 0604 12/02/20 1351 12/02/20 2231 12/03/20 0613  BP: (!) 154/83 (!) 160/77 135/68 131/69  Pulse: (!) 104 95 98 88  Resp: 18  18 18   Temp: 98.5 F (36.9 C) 97.8 F (36.6 C) 98.3 F (36.8 C) 98 F (36.7 C)  TempSrc: Oral Oral Oral Oral  SpO2: 100% 95% 100% 97%  Weight:    104.8 kg  Height:    5\' 3"  (1.6 m)    Intake/Output Summary (Last 24 hours) at 12/03/2020 1136 Last data filed at 12/03/2020 1032 Gross per 24 hour  Intake 780 ml  Output 0 ml  Net 780 ml   Filed Weights   12/03/20 0613  Weight: 104.8 kg   Physical exam General: Obese built, not in obvious distress HENT:   No scleral pallor or icterus noted. Oral mucosa is moist.  Chest:  Clear breath sounds.  Diminished breath sounds bilaterally. No crackles or wheezes.  CVS: S1 &S2 heard. No murmur.  Regular rate and rhythm. Abdomen: Soft, nontender, nondistended.  Bowel sounds are heard.   Extremities: No cyanosis, clubbing or edema.  Peripheral pulses are palpable.  Surgical biopsy site with dressing. Psych: Alert, awake and oriented, normal mood CNS:  No cranial nerve deficits.  Power equal in all extremities.   Skin: Warm and dry.  No rashes noted.  Lab Results:  Data Reviewed: I have personally reviewed the following labs and imaging studies  CBC: Recent Labs  Lab 11/27/20 0447 11/28/20 0447 11/29/20 0445 11/30/20 0610  WBC 7.3 12.8* 10.3 9.3  HGB 11.1* 10.2* 10.5* 10.7*  HCT 36.5 33.2* 34.8* 35.2*  MCV 81.8  80.6 83.1 81.9  PLT 314 323 328 259    Basic Metabolic Panel: Recent Labs  Lab 11/27/20 0447 11/28/20 0447 11/29/20 0445 11/30/20 0610  NA 135 133* 136 138  K 4.5 4.0 4.2 4.0  CL 102 101 105 104  CO2 27 24 24 26   GLUCOSE 263* 339* 238* 141*  BUN 12 20 17 18   CREATININE 0.44 0.44 0.51 0.47  CALCIUM 9.1 8.9 8.9 9.2  MG  --  1.9 2.0  --   PHOS  --  3.4 4.0  --     GFR: Estimated Creatinine Clearance: 91 mL/min (by C-G formula based on SCr of 0.47 mg/dL).  Liver Function Tests: Recent Labs  Lab 11/27/20 0447 11/28/20 0447 11/29/20 0445 11/30/20 0610  AST 82* 74* 83* 78*  ALT 162* 144* 141* 135*  ALKPHOS 54 52 52 51  BILITOT 0.4 0.5 0.3 0.7  PROT 6.5 6.6 6.6 6.7  ALBUMIN 3.2* 3.2* 3.3* 3.3*     Cardiac Enzymes: Recent Labs  Lab 11/29/20 0445  CKTOTAL 2,578*     HbA1C: No results for input(s): HGBA1C  in the last 72 hours.  CBG: Recent Labs  Lab 12/02/20 0725 12/02/20 1130 12/02/20 1638 12/02/20 2232 12/03/20 0751  GLUCAP 242* 293* 312* 284* 198*     Recent Results (from the past 240 hour(s))  Resp Panel by RT-PCR (Flu A&B, Covid) Nasopharyngeal Swab     Status: None   Collection Time: 11/24/20 11:30 PM   Specimen: Nasopharyngeal Swab; Nasopharyngeal(NP) swabs in vial transport medium  Result Value Ref Range Status   SARS Coronavirus 2 by RT PCR NEGATIVE NEGATIVE Final    Comment: (NOTE) SARS-CoV-2 target nucleic acids are NOT DETECTED.  The SARS-CoV-2 RNA is generally detectable in upper respiratory specimens during the acute phase of infection. The lowest concentration of SARS-CoV-2 viral copies this assay can detect is 138 copies/mL. A negative result does not preclude SARS-Cov-2 infection and should not be used as the sole basis for treatment or other patient management decisions. A negative result may occur with  improper specimen collection/handling, submission of specimen other than nasopharyngeal swab, presence of viral mutation(s)  within the areas targeted by this assay, and inadequate number of viral copies(<138 copies/mL). A negative result must be combined with clinical observations, patient history, and epidemiological information. The expected result is Negative.  Fact Sheet for Patients:  EntrepreneurPulse.com.au  Fact Sheet for Healthcare Providers:  IncredibleEmployment.be  This test is no t yet approved or cleared by the [place] FDA and  has been authorized for detection and/or diagnosis of SARS-CoV-2 by FDA under an Emergency Use Authorization (EUA). This EUA will remain  in effect (meaning this test can be used) for the duration of the COVID-19 declaration under Section 564(b)(1) of the Act, 21 U.S.C.section 360bbb-3(b)(1), unless the authorization is terminated  or revoked sooner.       Influenza A by PCR NEGATIVE NEGATIVE Final   Influenza B by PCR NEGATIVE NEGATIVE Final    Comment: (NOTE) The Xpert Xpress SARS-CoV-2/FLU/RSV plus assay is intended as an aid in the diagnosis of influenza from Nasopharyngeal swab specimens and should not be used as a sole basis for treatment. Nasal washings and aspirates are unacceptable for Xpert Xpress SARS-CoV-2/FLU/RSV testing.  Fact Sheet for Patients: EntrepreneurPulse.com.au  Fact Sheet for Healthcare Providers: IncredibleEmployment.be  This test is not yet approved or cleared by the [place] FDA and has been authorized for detection and/or diagnosis of SARS-CoV-2 by FDA under an Emergency Use Authorization (EUA). This EUA will remain in effect (meaning this test can be used) for the duration of the COVID-19 declaration under Section 564(b)(1) of the Act, 21 U.S.C. section 360bbb-3(b)(1), unless the authorization is terminated or revoked.  Performed at [facility], [place] [address] [place], [place] [address]   MRSA PCR Screening     Status: None    Collection Time: 11/27/20  5:23 AM   Specimen: Nasal Mucosa; Nasopharyngeal  Result Value Ref Range Status   MRSA by PCR NEGATIVE NEGATIVE Final    Comment:        The GeneXpert MRSA Assay (FDA approved for NASAL specimens only), is one component of a comprehensive MRSA colonization surveillance program. It is not intended to diagnose MRSA infection nor to guide or monitor treatment for MRSA infections. Performed at [facility], [place] [address], [place], [place] [address]       Radiology Studies: No results found.    LOS: 8 days   Katarzyna Wolven,MD [facility]  12/03/2020, 11:36 AM

## 2020-12-03 NOTE — Progress Notes (Signed)
Physical Therapy Treatment Patient Details Name: Kristine Bauer MRN: [id number] DOB: 01-May-1966 Today's Date: 12/03/2020    History of Present Illness 55 yo female admitted with LE myositis. Hx of irritable myopathy, myositis, DM, CHF, obesity, neuropathy, asthma, falls    PT Comments    General Gait Details: pt tolerating an increased distance however still present with instability and decreased functional mobility when compared to pre hospital stay.  Pt present with decreased endurance, dereased strength as well as reduced quality of Indep due to her Myositis. Pt requires several rests break to complete her daily self needs such as toileting and esp ADL's.  Prior pt was Indep (home alone) and amb several blocks to catch the bus to go to work.  Her progress is slow and needs Skilled Rehab to address her mobility deficts.  She is a HIGH FALL RISK and a HIGH Readmit risk.  Follow Up Recommendations  SNF     Equipment Recommendations  Rolling walker with 5" wheels;3in1 (PT)    Recommendations for Other Services       Precautions / Restrictions Precautions Precautions: Fall Restrictions Weight Bearing Restrictions: No    Mobility  Bed Mobility               General bed mobility comments: OOB in recliner    Transfers Overall transfer level: Needs assistance Equipment used: Straight cane Transfers: Sit to/from Stand Sit to Stand: Modified independent (Device/Increase time)         General transfer comment: extra effort to push up, use of rail, decreased control of descent.  Also using furniture/doorway to steady self.  Ambulation/Gait Ambulation/Gait assistance: Supervision;Min guard Gait Distance (Feet): 155 Feet Assistive device: Straight cane Gait Pattern/deviations: Step-through pattern;Decreased stride length Gait velocity: decreased   General Gait Details: pt tolerating an increased distance however still present with instability and decreased  functional mobility when compared to pre hospital stay.  Pt present with decreased endurance, dereased strength as well as reduced quality of Indep due to her Myositis. Pt requires several rests break to complete her daily self needs such as toileting and esp ADL's.  Prior pt was Indep (home alone) and amb several blocks to catch the bus to go to work.  Her progress is slow and needs Skilled Rehab to address her mobility deficts.  She is a HIGH FALL RISK and a HIGH Readmit risk.   Stairs             Wheelchair Mobility    Modified Rankin (Stroke Patients Only)       Balance                                            Cognition Arousal/Alertness: Awake/alert Behavior During Therapy: WFL for tasks assessed/performed Overall Cognitive Status: Within Functional Limits for tasks assessed                                 General Comments: AxO x 3 very pleasant  lady      Exercises      General Comments        Pertinent Vitals/Pain Pain Assessment: Faces Faces Pain Scale: Hurts a little bit Pain Location: general Pain Descriptors / Indicators: Aching Pain Intervention(s): Monitored during session    Home Living  Prior Function            PT Goals (current goals can now be found in the care plan section) Progress towards PT goals: Progressing toward goals    Frequency    Min 3X/week      PT Plan Current plan remains appropriate    Co-evaluation              AM-PAC PT "6 Clicks" Mobility   Outcome Measure  Help needed turning from your back to your side while in a flat bed without using bedrails?: None Help needed moving from lying on your back to sitting on the side of a flat bed without using bedrails?: None Help needed moving to and from a bed to a chair (including a wheelchair)?: A Little Help needed standing up from a chair using your arms (e.g., wheelchair or bedside chair)?: A  Little Help needed to walk in hospital room?: A Little Help needed climbing 3-5 steps with a railing? : A Lot 6 Click Score: 19    End of Session Equipment Utilized During Treatment: Gait belt Activity Tolerance: Patient limited by fatigue Patient left: in bed;with call bell/phone within reach Nurse Communication: Mobility status PT Visit Diagnosis: Pain;Difficulty in walking, not elsewhere classified (R26.2);Muscle weakness (generalized) (M62.81) Pain - Right/Left: Left Pain - part of body: Leg     Time: 1032-1100 PT Time Calculation (min) (ACUTE ONLY): 28 min  Charges:  $Gait Training: 8-22 mins $Therapeutic Activity: 8-22 mins                     Rica Koyanagi  PTA Acute  Rehabilitation Services Pager      [phone] Office      [phone]

## 2020-12-04 DIAGNOSIS — R7401 Elevation of levels of liver transaminase levels: Secondary | ICD-10-CM

## 2020-12-04 DIAGNOSIS — E1169 Type 2 diabetes mellitus with other specified complication: Secondary | ICD-10-CM | POA: Diagnosis not present

## 2020-12-04 DIAGNOSIS — R29898 Other symptoms and signs involving the musculoskeletal system: Secondary | ICD-10-CM | POA: Diagnosis not present

## 2020-12-04 DIAGNOSIS — M609 Myositis, unspecified: Secondary | ICD-10-CM | POA: Diagnosis not present

## 2020-12-04 DIAGNOSIS — E66813 Obesity, class 3: Secondary | ICD-10-CM

## 2020-12-04 DIAGNOSIS — G729 Myopathy, unspecified: Secondary | ICD-10-CM

## 2020-12-04 LAB — CBC
HCT: 35.8 % — ABNORMAL LOW (ref 36.0–46.0)
Hemoglobin: 10.7 g/dL — ABNORMAL LOW (ref 12.0–15.0)
MCH: 25.1 pg — ABNORMAL LOW (ref 26.0–34.0)
MCHC: 29.9 g/dL — ABNORMAL LOW (ref 30.0–36.0)
MCV: 84 fL (ref 80.0–100.0)
Platelets: 360 10*3/uL (ref 150–400)
RBC: 4.26 MIL/uL (ref 3.87–5.11)
RDW: 14.9 % (ref 11.5–15.5)
WBC: 8.6 10*3/uL (ref 4.0–10.5)
nRBC: 0 % (ref 0.0–0.2)

## 2020-12-04 LAB — COMPREHENSIVE METABOLIC PANEL
ALT: 151 U/L — ABNORMAL HIGH (ref 0–44)
AST: 100 U/L — ABNORMAL HIGH (ref 15–41)
Albumin: 3.7 g/dL (ref 3.5–5.0)
Alkaline Phosphatase: 54 U/L (ref 38–126)
Anion gap: 11 (ref 5–15)
BUN: 16 mg/dL (ref 6–20)
CO2: 23 mmol/L (ref 22–32)
Calcium: 9.5 mg/dL (ref 8.9–10.3)
Chloride: 102 mmol/L (ref 98–111)
Creatinine, Ser: 0.55 mg/dL (ref 0.44–1.00)
GFR, Estimated: >60 mL/min (ref >60)
Glucose, Bld: 288 mg/dL — ABNORMAL HIGH (ref 70–99)
Potassium: 4.5 mmol/L (ref 3.5–5.1)
Sodium: 136 mmol/L (ref 135–145)
Total Bilirubin: 0.9 mg/dL (ref 0.3–1.2)
Total Protein: 7.3 g/dL (ref 6.5–8.1)

## 2020-12-04 LAB — SARS CORONAVIRUS 2 (TAT 6-24 HRS): SARS Coronavirus 2: NEGATIVE

## 2020-12-04 LAB — GLUCOSE, CAPILLARY
Glucose-Capillary: 266 mg/dL — ABNORMAL HIGH (ref 70–99)
Glucose-Capillary: 227 mg/dL — ABNORMAL HIGH (ref 70–99)
Glucose-Capillary: 313 mg/dL — ABNORMAL HIGH (ref 70–99)

## 2020-12-04 LAB — MAGNESIUM: Magnesium: 1.9 mg/dL (ref 1.7–2.4)

## 2020-12-04 MED ORDER — INSULIN ASPART PROT & ASPART (70-30 MIX) 100 UNIT/ML ~~LOC~~ SUSP: 28.0000 [IU] | Freq: Two times a day (BID) | SUBCUTANEOUS | Status: AC

## 2020-12-04 MED ORDER — OXYCODONE-ACETAMINOPHEN 5-325 MG PO TABS: 1.0000 | ORAL_TABLET | Freq: Three times a day (TID) | ORAL | 0 refills | Status: DC | PRN

## 2020-12-04 MED ORDER — IBUPROFEN 400 MG PO TABS: 800.0000 mg | ORAL_TABLET | Freq: Three times a day (TID) | ORAL | Status: AC | PRN

## 2020-12-04 MED ORDER — GABAPENTIN 300 MG PO CAPS: 600.0000 mg | ORAL_CAPSULE | Freq: Three times a day (TID) | ORAL | Status: AC

## 2020-12-04 MED ORDER — ACETAMINOPHEN 325 MG PO TABS: 650.0000 mg | ORAL_TABLET | Freq: Four times a day (QID) | ORAL | Status: AC | PRN

## 2020-12-04 MED ORDER — INSULIN ASPART PROT & ASPART (70-30 MIX) 100 UNIT/ML ~~LOC~~ SUSP
28.0000 [IU] | Freq: Two times a day (BID) | SUBCUTANEOUS | Status: DC
  Administered 2020-12-04: 28 [IU] via SUBCUTANEOUS

## 2020-12-04 MED ORDER — CIPROFLOXACIN-DEXAMETHASONE 0.3-0.1 % OT SUSP: 4.0000 [drp] | Freq: Two times a day (BID) | OTIC | Status: DC

## 2020-12-04 NOTE — TOC Progression Note (Signed)
Transition of Care [facility]) - Progression Note   Patient Details  Name: Sahory Nordling MRN: [id number] Date of Birth: 02/11/1966  Transition of [facility]) CM/SW [place], LCSW Phone Number: 12/04/2020, 3:26 PM  Clinical Narrative: CSW followed up with Carollee Leitz with [facility] several times today and [facility] still has not given authorization for SNF. TOC awaiting insurance authorization.  Expected Discharge Plan: [place] Barriers to Discharge: Continued Medical Work up  Expected Discharge Plan and Services Expected Discharge Plan: [place] In-house Referral: Clinical Social Work Post Acute Care Choice: [place] Living arrangements for the past 2 months: Apartment  Readmission Risk Interventions No flowsheet data found.

## 2020-12-04 NOTE — Progress Notes (Signed)
Inpatient Diabetes Program Recommendations  AACE/ADA: New Consensus Statement on Inpatient Glycemic Control (2015)  Target Ranges:  Prepandial:   less than 140 mg/dL      Peak postprandial:   less than 180 mg/dL (1-2 hours)      Critically ill patients:  140 - 180 mg/dL   Lab Results  Component Value Date   GLUCAP 266 (H) 12/04/2020   HGBA1C 12.5 (H) 11/25/2020    Review of Glycemic Control Results for Kristine Bauer, Kristine Bauer (MRN [id number]) as of 12/04/2020 08:55  Ref. Range 12/03/2020 07:51 12/03/2020 11:46 12/03/2020 16:32 12/03/2020 21:27 12/04/2020 07:59  Glucose-Capillary Latest Ref Range: 70 - 99 mg/dL 198 (H) 208 (H) 213 (H) 333 (H) 266 (H)  Diabetes history: DM 2 Outpatient Diabetes medications:  Amaryl 8 mg daily Lantus 10 units daily Metformin 1500 mg bid Current orders for Inpatient glycemic control:  Novolog mix 70/30 25 units bid Novolog moderate tid with meals and HS Inpatient Diabetes Program Recommendations:    Please consider increasing Novolog 70/30 to 28 units bid.   Thanks,  Adah Perl, RN, BC-ADM Inpatient Diabetes Coordinator Pager [phone] (8a-5p)

## 2020-12-04 NOTE — Discharge Summary (Signed)
Physician Discharge Summary  Kristine Bauer HUD:[id number] DOB: Jul 01, 1966 DOA: 11/24/2020  PCP: Nicolette Bang, DO  Admit date: 11/24/2020 Discharge date: 12/04/2020  Recommendations for Outpatient Follow-up:    Neurology recommended outpatient follow-up with Dr. Posey Pronto neuromuscular neurologist [place] [facility] to follow-up biopsy and direct treatment.  Stay off statins.  No indication for steroids.  Insulin-dependent diabetes mellitus type 2 poorly controlled.  Hemoglobin A1c 12.5.  On Lantus, Metformin, sulfonylurea at home.  Was not really taking Lantus secondary to financial reasons. --Changed to 70/30 ReliOn based on financial considerations.   Follow-up resolution of otitis externa    Contact information for follow-up providers    [facility], [place]. Call.   Specialty: General Surgery Why: Call as needed for issues regarding incision.  Contact information: [address] [place] [address] [phone]        Narda Amber K, DO. Schedule an appointment as soon as possible for a visit in 1 week(s).   Specialty: Neurology Why: follow up of biopsy and futher plan Contact information: [address] [place] [address] [phone]        Nicolette Bang, DO. Schedule an appointment as soon as possible for a visit in 1 week(s).   Specialty: Family Medicine Contact information: [address] [place] [address] [phone]        Nahser, Wonda Cheng, MD .   Specialty: Cardiology Contact information: [place] [address] [phone]            Contact information for after-discharge care    Destination    [facility] .   Service: Skilled Nursing Contact information: [place] [phone]                   Discharge Diagnoses: Principal diagnosis is #1 Principal Problem:    Myopathy Active Problems:   Diabetes mellitus, type 2 ([place])   HTN (hypertension)   Myositis of both lower legs   Elevated CK   Elevated liver enzymes   Bilateral leg weakness   Obesity, Class III, BMI 40-49.9 (morbid obesity) ([place])   Transaminitis   Discharge Condition: improved Disposition: short-term SNF  Diet recommendation:  Diet Orders (From admission, onward)    Start     Ordered   12/04/20 0000  Diet - low sodium heart healthy        12/04/20 1648   11/28/20 0951  Diet Carb Modified Fluid consistency: Thin; Room service appropriate? Yes  Diet effective now       Question Answer Comment  Diet-HS Snack? Nothing   Calorie Level Medium 1600-2000   Fluid consistency: Thin   Room service appropriate? Yes      11/28/20 0950           Filed Weights   12/03/20 0613  Weight: 104.8 kg    HPI/Hospital Course:   55 year old woman PMH myositis, diabetes, diastolic CHF presented with bilateral lower extremity weakness and difficulty getting off the toilet.  Followed by neurology in the last 2 years for bilateral lower extremity weakness and had previously declined outpatient biopsy.  Imaging of the spine was unremarkable.  Seen by therapy.  Underwent biopsy by surgical team 3/23.  SNF recommended.  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  MRI of the thoracic and lumbar spine without significant findings.  Seen by neurology.  Underwent muscle biopsy 3/23.  No need for formal surgery follow-up.  Pathology pending. --Neurology recommended outpatient follow-up with Dr. Posey Pronto neuromuscular neurologist [place] [facility] to follow-up biopsy and direct treatment.  Stay off statins.  No indication for steroids.  Insulin-dependent diabetes mellitus type 2 poorly controlled.  Hemoglobin A1c 12.5.  On Lantus, Metformin, sulfonylurea at home.  Was not really taking Lantus secondary to financial  reasons. --Changed to 70/30 ReliOn based on financial considerations. For discharge: Novolin ReliOn 70/30 Flexpen Insulin Pen - # [id number] --Remains hyperglycemic.  Increase NovoLog 70/30 to 28 units twice daily.  Essential hypertension --Continue amlodipine, hydrochlorothiazide and Diovan.  Consider alternative agent on discharge home.  Normocytic anemia, likely anemia of chronic disease.  No history to suggest bleeding.  Anemia panel without any clear deficiencies.  Morbid obesity.  Body mass index is 40.93 kg/m. --Follow-up as an outpatient.  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  Hepatitis panel was negative.  Atypical chest pain, resolved.  EKG nonacute.  Troponins normal. --Continue PPI.  Mild otitis externa AD --improving on exam, minimal irritation external canal, some wax, tympanic membrane appears unremarkable   Significant Hospital Events    Admit 3/24 myositis  Consults:   Neurology informally by chart only  General surgery  Procedures:   3/23 muscle biopsy   Today's assessment: S: CC: f/u leg weakness  Legs feel about the same today, has difficulty getting up and getting out of bed. Right ear itches.  O: Vitals:  Vitals:   12/03/20 1438 12/03/20 2123  BP: (!) 163/75 (!) 150/83  Pulse: 97 98  Resp: 15 18  Temp: 97.8 F (36.6 C) 98 F (36.7 C)  SpO2: 99% 97%    Constitutional:    Appears calm and comfortable ENMT:   grossly normal hearing  Respiratory:   CTA bilaterally, no w/r/r.   Respiratory effort normal.  Cardiovascular:   RRR, no m/r/g  No LE extremity edema   Musculoskeletal:   RUE, LUE, RLE, LLE   Moves both legs to command Psychiatric:   Mental status ? Mood, affect appropriate    Today's Data   CBG 200-300s  BMP, Mg noted  AST 100  ALT 151  CBC stable   Discharge Instructions  Discharge Instructions    Diet - low sodium heart healthy   Complete by: As directed    Discharge wound care:    Complete by: As directed    Keep covered but may shower. For questions about care, contact general surgery office on discharge summary.     Allergies as of 12/04/2020      Reactions   Statins Other (See Comments)   Myopathy      Medication List    STOP taking these medications   cyclobenzaprine 10 MG tablet Commonly known as: FLEXERIL   diclofenac 50 MG EC tablet Commonly known as: VOLTAREN   glimepiride 4 MG tablet Commonly known as: AMARYL   hydrOXYzine 25 MG capsule Commonly known as: Vistaril   Lantus SoloStar 100 UNIT/ML Solostar Pen Generic drug: insulin glargine   tiZANidine 4 MG tablet Commonly known as: Zanaflex     TAKE these medications   accu-chek multiclix lancets 1 each by Other route 3 (three) times daily.   acetaminophen 325 MG tablet Commonly known as: TYLENOL Take 2 tablets (650 mg total) by mouth every 6 (six) hours as needed for mild pain (or Fever >/= 101).   albuterol 108 (90 Base) MCG/ACT inhaler Commonly known as:  VENTOLIN HFA Inhale 1-2 puffs into the lungs every 6 (six) hours as needed for wheezing or shortness of breath.   amLODipine 5 MG tablet Commonly known as: NORVASC Take 1 tablet (5 mg total) by mouth daily.   ciprofloxacin-dexamethasone OTIC suspension Commonly known as: CIPRODEX Place 4 drops into the right ear 2 (two) times daily. Last day 4/3   D3 50 MCG (2000 UT) Tabs Generic drug: Cholecalciferol TAKE 1 TABLET BY MOUTH EVERY DAY   DAILY MULTIVITAMINS/IRON PO Take 1 tablet by mouth daily. Centrum Silver 50+ / One A day   fluticasone 50 MCG/ACT nasal spray Commonly known as: FLONASE Place 2 sprays into both nostrils daily.   gabapentin 300 MG capsule Commonly known as: NEURONTIN Take 2 capsules (600 mg total) by mouth 3 (three) times daily. What changed: See the new instructions.   hydrochlorothiazide 25 MG tablet Commonly known as: HYDRODIURIL Take 1 tablet (25 mg total) by mouth daily.   ibuprofen 400 MG  tablet Commonly known as: ADVIL Take 2 tablets (800 mg total) by mouth every 8 (eight) hours as needed for moderate pain.   insulin aspart protamine- aspart (70-30) 100 UNIT/ML injection Commonly known as: NOVOLOG MIX 70/30 Inject 0.28 mLs (28 Units total) into the skin 2 (two) times daily with a meal.   metFORMIN 500 MG 24 hr tablet Commonly known as: GLUCOPHAGE-XR Take 3 tablets (1,500 mg total) by mouth daily with breakfast.   montelukast 10 MG tablet Commonly known as: SINGULAIR TAKE 1 TABLET BY MOUTH EVERYDAY AT BEDTIME   oxyCODONE-acetaminophen 5-325 MG tablet Commonly known as: PERCOCET/ROXICET Take 1 tablet by mouth every 8 (eight) hours as needed for severe pain.   pantoprazole 40 MG tablet Commonly known as: PROTONIX TAKE 1 TABLET BY MOUTH EVERY DAY   Prodigy No Coding Blood Gluc test strip Generic drug: glucose blood Use as instructed to check blood sugar twice daily. E11.8   sitaGLIPtin 100 MG tablet Commonly known as: JANUVIA Take 100 mg by mouth daily.   TRUEplus Pen Needles 32G X 4 MM Misc Generic drug: Insulin Pen Needle Use as instructed to inject insulin daily.   valsartan 160 MG tablet Commonly known as: DIOVAN TAKE 1 TABLET BY MOUTH EVERY DAY            Discharge Care Instructions  (From admission, onward)         Start     Ordered   12/04/20 0000  Discharge wound care:       Comments: Keep covered but may shower. For questions about care, contact general surgery office on discharge summary.   12/04/20 1648         Allergies  Allergen Reactions  . Statins Other (See Comments)    Myopathy    The results of significant diagnostics from this hospitalization (including imaging, microbiology, ancillary and laboratory) are listed below for reference.    Significant Diagnostic Studies: MR THORACIC SPINE WO CONTRAST  Result Date: 11/25/2020 CLINICAL DATA:  Progressive back pain and neurologic deficit. History of neuropathy and myopathy.  Bilateral lower extremity weakness. Unable to ambulate. EXAM: MRI THORACIC AND LUMBAR SPINE WITHOUT CONTRAST TECHNIQUE: Multiplanar and multiecho pulse sequences of the thoracic and lumbar spine were obtained without intravenous contrast. COMPARISON:  None. FINDINGS: MRI THORACIC SPINE FINDINGS Alignment:  Normal Vertebrae: Normal marrow signal.  No bone lesions or fractures. Cord:  Normal cord signal intensity.  No cord lesions or syrinx. Paraspinal and other soft tissues: No significant paraspinal findings. No obvious  mediastinal mass or lung lesions. No pleural effusions. Disc levels: Mild multilevel degenerative disc disease with disc space narrowing and osteophytic spurring. No large disc protrusions or significant spinal stenosis. Multilevel shallow disc osteophyte complexes are noted without significant mass effect on the thecal sac or spinal cord. Shallow right-sided disc osteophyte complex at T2-3 with mild impression on the right side of the thecal sac. Degenerative disc disease at T7-8 with shallow bilateral disc osteophyte complexes and mild impression on both sides of the thecal sac. Right-sided disc osteophyte complex at T8-9. Bilateral disc osteophyte complexes at T9-10. Small central disc protrusion at T11-12. MRI LUMBAR SPINE FINDINGS Segmentation: There are five lumbar type vertebral bodies. The last full intervertebral disc space is labeled L5-S1. This correlates with the lumbar spine numbering. Alignment:  Normal Vertebrae:  Normal marrow signal.  No bone lesions or fractures. Conus medullaris and cauda equina: Conus extends to the T12-L1 level. Conus and cauda equina appear normal. Paraspinal and other soft tissues: No significant paraspinal or retroperitoneal findings. Disc levels: The spinal canal is quite generous. No significant spinal or foraminal stenosis. L1-2: No significant findings. L2-3: Mild annular bulge with slight impression on both sides of the thecal sac and mild lateral recess  encroachment. No significant spinal or foraminal stenosis. L3-4: Mild bulging annulus and mild facet disease with mild bilateral lateral recess encroachment. No significant spinal or foraminal stenosis. L4-5: Moderate facet disease but no disc protrusions, spinal or foraminal stenosis. L5-S1: Shallow broad-based right paracentral and medial foraminal disc protrusion. There is slight flattening of the ventral thecal sac and potential irritation of the right S1 nerve root. The exiting L5 nerve roots appear normal. IMPRESSION: 1. Mild multilevel degenerative thoracic spondylosis with disc space narrowing and osteophytic spurring. Multilevel shallow disc osteophyte complexes bilaterally with mild impression on the thecal sac. No significant spinal stenosis. 2. Small central disc protrusion at T11-12. 3. Shallow broad-based right paracentral and medial foraminal disc protrusion at L5-S1. No significant neural compression but potential irritation of the right S1 nerve root. 4. Mild bilateral lateral recess encroachment at L2-3 and L3-4. 5. Unremarkable MR appearance of the thoracic spinal cord. No cord lesions or syrinx is identified. Electronically Signed   By: Marijo Sanes M.D.   On: 11/25/2020 08:57   MR LUMBAR SPINE WO CONTRAST  Result Date: 11/25/2020 CLINICAL DATA:  Progressive back pain and neurologic deficit. History of neuropathy and myopathy. Bilateral lower extremity weakness. Unable to ambulate. EXAM: MRI THORACIC AND LUMBAR SPINE WITHOUT CONTRAST TECHNIQUE: Multiplanar and multiecho pulse sequences of the thoracic and lumbar spine were obtained without intravenous contrast. COMPARISON:  None. FINDINGS: MRI THORACIC SPINE FINDINGS Alignment:  Normal Vertebrae: Normal marrow signal.  No bone lesions or fractures. Cord:  Normal cord signal intensity.  No cord lesions or syrinx. Paraspinal and other soft tissues: No significant paraspinal findings. No obvious mediastinal mass or lung lesions. No pleural  effusions. Disc levels: Mild multilevel degenerative disc disease with disc space narrowing and osteophytic spurring. No large disc protrusions or significant spinal stenosis. Multilevel shallow disc osteophyte complexes are noted without significant mass effect on the thecal sac or spinal cord. Shallow right-sided disc osteophyte complex at T2-3 with mild impression on the right side of the thecal sac. Degenerative disc disease at T7-8 with shallow bilateral disc osteophyte complexes and mild impression on both sides of the thecal sac. Right-sided disc osteophyte complex at T8-9. Bilateral disc osteophyte complexes at T9-10. Small central disc protrusion at T11-12. MRI LUMBAR SPINE FINDINGS  Segmentation: There are five lumbar type vertebral bodies. The last full intervertebral disc space is labeled L5-S1. This correlates with the lumbar spine numbering. Alignment:  Normal Vertebrae:  Normal marrow signal.  No bone lesions or fractures. Conus medullaris and cauda equina: Conus extends to the T12-L1 level. Conus and cauda equina appear normal. Paraspinal and other soft tissues: No significant paraspinal or retroperitoneal findings. Disc levels: The spinal canal is quite generous. No significant spinal or foraminal stenosis. L1-2: No significant findings. L2-3: Mild annular bulge with slight impression on both sides of the thecal sac and mild lateral recess encroachment. No significant spinal or foraminal stenosis. L3-4: Mild bulging annulus and mild facet disease with mild bilateral lateral recess encroachment. No significant spinal or foraminal stenosis. L4-5: Moderate facet disease but no disc protrusions, spinal or foraminal stenosis. L5-S1: Shallow broad-based right paracentral and medial foraminal disc protrusion. There is slight flattening of the ventral thecal sac and potential irritation of the right S1 nerve root. The exiting L5 nerve roots appear normal. IMPRESSION: 1. Mild multilevel degenerative thoracic  spondylosis with disc space narrowing and osteophytic spurring. Multilevel shallow disc osteophyte complexes bilaterally with mild impression on the thecal sac. No significant spinal stenosis. 2. Small central disc protrusion at T11-12. 3. Shallow broad-based right paracentral and medial foraminal disc protrusion at L5-S1. No significant neural compression but potential irritation of the right S1 nerve root. 4. Mild bilateral lateral recess encroachment at L2-3 and L3-4. 5. Unremarkable MR appearance of the thoracic spinal cord. No cord lesions or syrinx is identified. Electronically Signed   By: Marijo Sanes M.D.   On: 11/25/2020 08:57   DG CHEST PORT 1 VIEW  Result Date: 11/26/2020 CLINICAL DATA:  Midline chest pain. EXAM: PORTABLE CHEST 1 VIEW COMPARISON:  02/09/2020. FINDINGS: Mediastinum hilar structures normal. Heart size normal. Low lung volumes. No focal infiltrate. No pleural effusion or pneumothorax. Thoracic spine scoliosis and degenerative change. IMPRESSION: Low lung volumes. No acute cardiopulmonary disease. Electronically Signed   By: Marcello Moores  Register   On: 11/26/2020 12:16    Microbiology: Recent Results (from the past 240 hour(s))  Resp Panel by RT-PCR (Flu A&B, Covid) Nasopharyngeal Swab     Status: None   Collection Time: 11/24/20 11:30 PM   Specimen: Nasopharyngeal Swab; Nasopharyngeal(NP) swabs in vial transport medium  Result Value Ref Range Status   SARS Coronavirus 2 by RT PCR NEGATIVE NEGATIVE Final    Comment: (NOTE) SARS-CoV-2 target nucleic acids are NOT DETECTED.  The SARS-CoV-2 RNA is generally detectable in upper respiratory specimens during the acute phase of infection. The lowest concentration of SARS-CoV-2 viral copies this assay can detect is 138 copies/mL. A negative result does not preclude SARS-Cov-2 infection and should not be used as the sole basis for treatment or other patient management decisions. A negative result may occur with  improper specimen  collection/handling, submission of specimen other than nasopharyngeal swab, presence of viral mutation(s) within the areas targeted by this assay, and inadequate number of viral copies(<138 copies/mL). A negative result must be combined with clinical observations, patient history, and epidemiological information. The expected result is Negative.  Fact Sheet for Patients:  EntrepreneurPulse.com.au  Fact Sheet for Healthcare Providers:  IncredibleEmployment.be  This test is no t yet approved or cleared by the [place] FDA and  has been authorized for detection and/or diagnosis of SARS-CoV-2 by FDA under an Emergency Use Authorization (EUA). This EUA will remain  in effect (meaning this test can be used) for the duration  of the COVID-19 declaration under Section 564(b)(1) of the Act, 21 U.S.C.section 360bbb-3(b)(1), unless the authorization is terminated  or revoked sooner.       Influenza A by PCR NEGATIVE NEGATIVE Final   Influenza B by PCR NEGATIVE NEGATIVE Final    Comment: (NOTE) The Xpert Xpress SARS-CoV-2/FLU/RSV plus assay is intended as an aid in the diagnosis of influenza from Nasopharyngeal swab specimens and should not be used as a sole basis for treatment. Nasal washings and aspirates are unacceptable for Xpert Xpress SARS-CoV-2/FLU/RSV testing.  Fact Sheet for Patients: EntrepreneurPulse.com.au  Fact Sheet for Healthcare Providers: IncredibleEmployment.be  This test is not yet approved or cleared by the [place] FDA and has been authorized for detection and/or diagnosis of SARS-CoV-2 by FDA under an Emergency Use Authorization (EUA). This EUA will remain in effect (meaning this test can be used) for the duration of the COVID-19 declaration under Section 564(b)(1) of the Act, 21 U.S.C. section 360bbb-3(b)(1), unless the authorization is terminated or revoked.  Performed at [facility], [place] [address], [place], [place] [address]   MRSA PCR Screening     Status: None   Collection Time: 11/27/20  5:23 AM   Specimen: Nasal Mucosa; Nasopharyngeal  Result Value Ref Range Status   MRSA by PCR NEGATIVE NEGATIVE Final    Comment:        The GeneXpert MRSA Assay (FDA approved for NASAL specimens only), is one component of a comprehensive MRSA colonization surveillance program. It is not intended to diagnose MRSA infection nor to guide or monitor treatment for MRSA infections. Performed at [facility], [place] [address] [place], [place] [address]   SARS CORONAVIRUS 2 (TAT 6-24 HRS) Nasopharyngeal Nasopharyngeal Swab     Status: None   Collection Time: 12/03/20  5:07 PM   Specimen: Nasopharyngeal Swab  Result Value Ref Range Status   SARS Coronavirus 2 NEGATIVE NEGATIVE Final    Comment: (NOTE) SARS-CoV-2 target nucleic acids are NOT DETECTED.  The SARS-CoV-2 RNA is generally detectable in upper and lower respiratory specimens during the acute phase of infection. Negative results do not preclude SARS-CoV-2 infection, do not rule out co-infections with other pathogens, and should not be used as the sole basis for treatment or other patient management decisions. Negative results must be combined with clinical observations, patient history, and epidemiological information. The expected result is Negative.  Fact Sheet for Patients: SugarRoll.be  Fact Sheet for Healthcare Providers: https://www.woods-mathews.com/  This test is not yet approved or cleared by the [place] FDA and  has been authorized for detection and/or diagnosis of SARS-CoV-2 by FDA under an Emergency Use Authorization (EUA). This EUA will remain  in effect (meaning this test can be used) for the duration of the COVID-19 declaration under Se ction 564(b)(1) of the Act, 21 U.S.C. section 360bbb-3(b)(1), unless the  authorization is terminated or revoked sooner.  Performed at [facility], [place] [address] [place], [place] [address]      Labs: Basic Metabolic Panel: Recent Labs  Lab 11/28/20 0447 11/29/20 0445 11/30/20 0610 12/04/20 0511  NA 133* 136 138 136  K 4.0 4.2 4.0 4.5  CL 101 105 104 102  CO2 24 24 26 23   GLUCOSE 339* 238* 141* 288*  BUN 20 17 18 16   CREATININE 0.44 0.51 0.47 0.55  CALCIUM 8.9 8.9 9.2 9.5  MG 1.9 2.0  --  1.9  PHOS 3.4 4.0  --   --  Liver Function Tests: Recent Labs  Lab 11/28/20 0447 11/29/20 0445 11/30/20 0610 12/04/20 0511  AST 74* 83* 78* 100*  ALT 144* 141* 135* 151*  ALKPHOS 52 52 51 54  BILITOT 0.5 0.3 0.7 0.9  PROT 6.6 6.6 6.7 7.3  ALBUMIN 3.2* 3.3* 3.3* 3.7   CBC: Recent Labs  Lab 11/28/20 0447 11/29/20 0445 11/30/20 0610 12/04/20 0511  WBC 12.8* 10.3 9.3 8.6  HGB 10.2* 10.5* 10.7* 10.7*  HCT 33.2* 34.8* 35.2* 35.8*  MCV 80.6 83.1 81.9 84.0  PLT 323 328 366 360   Cardiac Enzymes: Recent Labs  Lab 11/29/20 0445  CKTOTAL 2,578*    Recent Labs    02/09/20 1601 11/24/20 2212  BNP 5.7 19.6    CBG: Recent Labs  Lab 12/03/20 1146 12/03/20 1632 12/03/20 2127 12/04/20 0759 12/04/20 1155  GLUCAP 208* 213* 333* 266* 227*    Principal Problem:   Myopathy Active Problems:   Diabetes mellitus, type 2 ([facility])   HTN (hypertension)   Myositis of both lower legs   Elevated CK   Elevated liver enzymes   Bilateral leg weakness   Obesity, Class III, BMI 40-49.9 (morbid obesity) ([facility])   Transaminitis   Time coordinating discharge: 45 minutes  Signed:  Murray Hodgkins, MD  [facility]  12/04/2020, 4:49 PM

## 2020-12-04 NOTE — TOC Transition Note (Signed)
Transition of Care [facility]) - CM/SW Discharge Note  Patient Details  Name: Kristine Bauer MRN: [id number] Date of Birth: 15-Jun-1966  [facility] CM/SW Contact:  Sherie Don, LCSW Phone Number: 12/04/2020, 4:25 PM  Clinical Narrative: CSW received call from Carollee Leitz that insurance authorization has been approved. CSW updated patient. Medical necessity form done; PTAR scheduled. Discharge paperwork faxed to [facility] in [place]. COIVD test is negative. Patient will go to room 131A and the number for report is [phone]. RN updated. TOC signing off.  Final next level of care: Skilled Nursing Facility Barriers to Discharge: Barriers Resolved  Patient Goals and CMS Choice Patient states their goals for this hospitalization and ongoing recovery are:: Get stronger CMS Medicare.gov Compare Post Acute Care list provided to:: Patient Choice offered to / list presented to : Patient  Discharge Placement PASRR number recieved: 11/26/20        Patient [facility] bed [facility]: [facility] Patient to be transferred to facility by: PTAR Patient and family notified of of transfer: 12/04/20  Discharge Plan and Services In-house Referral: Clinical Social Work Post Acute Care Choice: [place]          DME Arranged: N/A DME Agency: NA  Readmission Risk Interventions No flowsheet data found.

## 2020-12-04 NOTE — Hospital Course (Signed)
55 year old woman PMH myositis, diabetes, diastolic CHF presented with bilateral lower extremity weakness and difficulty getting off the toilet.  Followed by neurology in the last 2 years for bilateral lower extremity weakness and had previously declined outpatient biopsy.  Underwent biopsy by surgical team 3/23.  SNF recommended.  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  MRI of the thoracic and lumbar spine without significant findings.  Seen by neurology.  Underwent muscle biopsy 3/23.  No need for formal surgery follow-up.  Pathology pending. --Neurology recommended outpatient follow-up with Dr. Posey Pronto neuromuscular neurologist while by our neurology to follow-up biopsy and direct treatment.  Stay off statins.  No indication for steroids.  Insulin-dependent diabetes mellitus type 2 poorly controlled.  Hemoglobin A1c 12.5.  On Lantus, Metformin, sulfonylurea at home.  Was not really taking Lantus secondary to financial reasons. --Changed to 70/30 ReliOn based on financial considerations. For discharge: Novolin ReliOn 70/30 Flexpen Insulin Pen - # [id number] --Remains hyperglycemic.  Increase NovoLog 70/30 to 28 units twice daily.  Essential hypertension --Continue amlodipine, hydrochlorothiazide and Diovan.  Consider alternative agent on discharge home.  Normocytic anemia, likely anemia of chronic disease.  No history to suggest bleeding.  Anemia panel without any clear deficiencies.  Morbid obesity.  Body mass index is 40.93 kg/m. --Follow-up as an outpatient.  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  Hepatitis panel was negative.  Atypical chest pain, resolved.  EKG nonacute.  Troponins normal. --Continue PPI.

## 2020-12-04 NOTE — Progress Notes (Signed)
Tried to call [facility] twice with no answer to give report.  Patient was given discharge instructions, and all questions were answered.  Patient was stable for discharge and was taken to [facility] by [facility].

## 2020-12-04 NOTE — Progress Notes (Signed)
Occupational Therapy Treatment Patient Details Name: Kristine Bauer MRN: [id number] DOB: 07/03/1966 Today's Date: 12/04/2020    History of present illness 54 yo female admitted with LE myositis. Hx of irritable myopathy, myositis, DM, CHF, obesity, neuropathy, asthma, falls   OT comments  Upon arrival to patient's room, found in bathroom. Patient was able to transfer off of toilet, perform standing g/h tasks without any physical assistance. Utilizing single point cane patient participate in functional ambulation with no observed loss of balance with good activity tolerance. Upon arrival back to room patient demonstrates ability to pick up garbage from floor level without loss of balance and cane use. Patient states getting her legs in/out of bed is still hard but able to do it more independently now. Patient has met acute OT care goals, will sign off at this time. Please re-consult if new needs arise.   Follow Up Recommendations  No OT follow up    Equipment Recommendations  None recommended by OT       Precautions / Restrictions Precautions Precautions: Fall       Mobility Bed Mobility               General bed mobility comments: OOB upon arrival    Transfers Overall transfer level: Modified independent Equipment used: Straight cane Transfers: Sit to/from Stand Sit to Stand: Modified independent (Device/Increase time)         General transfer comment: patient did not need any physical assistance to power up to standing, did not observe any loss of balance during functional ambulation with cane    Balance Overall balance assessment: History of Falls;Modified Independent                                         ADL either performed or assessed with clinical judgement   ADL Overall ADL's : Modified independent                                       General ADL Comments: upon arrival patient found in bathroom, able to transfer  off the toilet and stand at sink side to wash her hands all without any physical assistance. Also witness patient bend over to pick up trash from floor without loss of balance.               Cognition Arousal/Alertness: Awake/alert Behavior During Therapy: WFL for tasks assessed/performed Overall Cognitive Status: Within Functional Limits for tasks assessed                                                     Pertinent Vitals/ Pain       Pain Assessment: Faces Faces Pain Scale: Hurts a little bit Pain Location: general Pain Descriptors / Indicators: Sore            Progress Toward Goals  OT Goals(current goals can now be found in the care plan section)  Progress towards OT goals: Goals met/education completed, patient discharged from OT  Acute Rehab OT Goals Patient Stated Goal: to regain independence OT Goal Formulation: All assessment and education complete, DC therapy  Plan All goals met and education completed,  patient discharged from [facility] OT "6 Clicks" Daily Activity     Outcome Measure   Help from another person eating meals?: None Help from another person taking care of personal grooming?: None Help from another person toileting, which includes using toliet, bedpan, or urinal?: None Help from another person bathing (including washing, rinsing, drying)?: None Help from another person to put on and taking off regular upper body clothing?: None Help from another person to put on and taking off regular lower body clothing?: None 6 Click Score: 24    End of Session Equipment Utilized During Treatment: Other (comment) (cane)  OT Visit Diagnosis: Pain Pain - Right/Left: Left Pain - part of body: Leg   Activity Tolerance Patient tolerated treatment well   Patient Left Other (comment) (in room)   Nurse Communication Mobility status        Time: 8016-5537 OT Time Calculation (min): 19 min  Charges: OT General  Charges $OT Visit: 1 Visit OT Treatments $Self Care/Home Management : 8-22 mins  Delbert Phenix OT OT pager: [place] 12/04/2020, 1:06 PM

## 2020-12-10 ENCOUNTER — Encounter (HOSPITAL_COMMUNITY): Payer: Self-pay

## 2020-12-10 LAB — SURGICAL PATHOLOGY

## 2020-12-16 ENCOUNTER — Encounter: Payer: Self-pay | Admitting: Neurology

## 2020-12-16 ENCOUNTER — Other Ambulatory Visit: Payer: Self-pay

## 2020-12-16 ENCOUNTER — Ambulatory Visit (INDEPENDENT_AMBULATORY_CARE_PROVIDER_SITE_OTHER): Payer: 59 | Admitting: Neurology

## 2020-12-16 VITALS — BP 129/85 | HR 102 | Ht 63.0 in | Wt 225.0 lb

## 2020-12-16 DIAGNOSIS — M3320 Polymyositis, organ involvement unspecified: Secondary | ICD-10-CM | POA: Diagnosis not present

## 2020-12-16 NOTE — Patient Instructions (Signed)
You will need to establish care with a rheumatology for polymyositis.  Please establish care with a primary care doctor in [facility] ASAP, so they can make a referral to rheumatology to get a treatment plan.  Please also follow-up with [facility] to follow-up on your incision.  Contact information for follow-up providers        Surgery, [place]. Call.   Specialty: General Surgery Why: Call as needed for issues regarding incision.  Contact information: [address] [address] [address] [phone]

## 2020-12-16 NOTE — Addendum Note (Signed)
Addended by: Armen Pickup A on: 12/16/2020 04:21 PM   Modules accepted: Orders

## 2020-12-16 NOTE — Progress Notes (Signed)
Follow-up Visit   Date: 12/16/20    Kristine Bauer MRN: [id number] DOB: 14-Sep-1965   Interim History: Kristine Bauer is a 55 y.o. right-handed African American female with hypertension, hyperlipidemia, diabetes mellitus (SHU8H 7) complicated by neuropathy returning to the clinic for follow-up of hyperCKemia and leg weakness.  The patient was accompanied to the clinic by self.  History of present illness: Starting around October 2019, she began having difficulty climbing stairs and feels as if her legs are heavy.  Symptoms have steadily progressed and she has difficulty getting up from chairs, getting in and out of her car, transferring into her bathtub. Over thanksgiving holiday, she required assistance to help pull her up from the sofa.  She has started to avoid sitting at the break table at work, because of problems with standing back up. She had had three falls this year, first in May 2019 and she tripped over the side walk, the latter two occurred while trying to put her pants on.  With her last fall, she hit her head. She also complains of low back pain which has been getting worse.  She was ordered PT by her PCP and went for initial evaluation only, due to cost she did not proceed with therapy sessions.   Rest does not help any of her symptoms.  She denies double vision, shortness of breath, difficulty swallowing/talking.   She had known diabetic neuropathy for the past year and has numbness of the feet.  She does not have numbness in her upper legs.   She works as a Radiation protection practitioner at [employer].  She lives alone in a 2nd floor apartment.   She also complains of right facial pain, described as throbbing and achy.  There is no numbness/tingling, or electrical shooting pain.   UPDATE 09/01/2018:  She is here for follow-up visit and electrodiagnostic testing of the left side which was consistent with irritable myopathy.  Since her last visit, her labs  indicated severely elevated CK > 16,000  > 9000.  Subsequent labs including myositis panel, inflammatory markers, coxsackie panel, ACE, and SPEP is normal.  She has stopped Lipitor and states that her leg weakness is somewhat better, such that she can stand up from low chairs without asking for help.  UPDATE 12/16/2020:  Patient was last seen in 2019 at which time muscle biopsy was recommended.  She was lost to follow-up.  Last month, she was hospitalized for progressive proximal leg weakness, after she was unable to climb up to her second floor apartment and later that week, unable to stand up from the commode. She complains or bilateral leg > arm weakness.  No hand or feet weakness.  She walks with a cane.   While hospitalized, her CK was noted to be >2500 and she underwent muscle biopsy which showed findings consistent with polymyositis.  She is not on any corticosteroids currently.  She was discharged to rehab center and is planning on moving to [place], [place] upon discharge to be closer to her mother and sister.   She also tells me that her left thigh incision is still open and admits to picking off the stitches.  She has not followed-up with surgery, as recommended by her discharge team.  She is seeing wound care at her facility.   Medications:  Current Outpatient Medications on File Prior to Visit  Medication Sig Dispense Refill  . acetaminophen (TYLENOL) 325 MG tablet Take 2 tablets (650 mg total) by mouth every  6 (six) hours as needed for mild pain (or Fever >/= 101).    Marland Kitchen albuterol (VENTOLIN HFA) 108 (90 Base) MCG/ACT inhaler Inhale 1-2 puffs into the lungs every 6 (six) hours as needed for wheezing or shortness of breath. 18 g 3  . amLODipine (NORVASC) 5 MG tablet Take 1 tablet (5 mg total) by mouth daily. 90 tablet 3  . Cholecalciferol 25 MCG (1000 UT) tablet Take 1,000 Units by mouth daily.    Marland Kitchen dextromethorphan-guaiFENesin (TUSSIN DM) 10-100 MG/5ML liquid Take 10 mLs by mouth every 4  (four) hours as needed for cough. Give 10 ml by mouth every 8 hours as needed for cough and congestion for 14 days.    . Dextrose, Diabetic Use, (INSTA-GLUCOSE) 77.4 % GEL Take by mouth.    . diphenhydrAMINE (BENADRYL) 25 MG tablet Take 25 mg by mouth every 6 (six) hours as needed.    . fluticasone (FLONASE) 50 MCG/ACT nasal spray Place 2 sprays into both nostrils daily. 16 g 6  . gabapentin (NEURONTIN) 300 MG capsule Take 2 capsules (600 mg total) by mouth 3 (three) times daily.    Marland Kitchen glucose blood (PRODIGY NO CODING BLOOD GLUC) test strip Use as instructed to check blood sugar twice daily. E11.8 100 each 12  . hydrochlorothiazide (HYDRODIURIL) 25 MG tablet Take 1 tablet (25 mg total) by mouth daily. 90 tablet 1  . hydrOXYzine (VISTARIL) 25 MG capsule Take 25 mg by mouth 3 (three) times daily as needed.    Marland Kitchen ibuprofen (ADVIL) 400 MG tablet Take 2 tablets (800 mg total) by mouth every 8 (eight) hours as needed for moderate pain.    Marland Kitchen insulin aspart protamine- aspart (NOVOLOG MIX 70/30) (70-30) 100 UNIT/ML injection Inject 0.28 mLs (28 Units total) into the skin 2 (two) times daily with a meal.    . Insulin Pen Needle (TRUEPLUS PEN NEEDLES) 32G X 4 MM MISC Use as instructed to inject insulin daily. 100 each 11  . Lancets (ACCU-CHEK MULTICLIX) lancets 1 each by Other route 3 (three) times daily.    . metFORMIN (GLUCOPHAGE-XR) 500 MG 24 hr tablet Take 3 tablets (1,500 mg total) by mouth daily with breakfast. 270 tablet 0  . montelukast (SINGULAIR) 10 MG tablet TAKE 1 TABLET BY MOUTH EVERYDAY AT BEDTIME 90 tablet 0  . pantoprazole (PROTONIX) 40 MG tablet TAKE 1 TABLET BY MOUTH EVERY DAY 90 tablet 0  . polyvinyl alcohol (LIQUIFILM TEARS) 1.4 % ophthalmic solution 1 drop as needed for dry eyes.    . sitaGLIPtin (JANUVIA) 100 MG tablet Take 100 mg by mouth daily.    . valsartan (DIOVAN) 160 MG tablet TAKE 1 TABLET BY MOUTH EVERY DAY 90 tablet 2  . ciprofloxacin-dexamethasone (CIPRODEX) OTIC suspension  Place 4 drops into the right ear 2 (two) times daily. Last day 4/3 (Patient not taking: Reported on 12/16/2020)    . D3 50 MCG (2000 UT) TABS TAKE 1 TABLET BY MOUTH EVERY DAY (Patient not taking: Reported on 12/16/2020) 90 tablet 1  . Multiple Vitamins-Iron (DAILY MULTIVITAMINS/IRON PO) Take 1 tablet by mouth daily. Centrum Silver 50+ / One A day (Patient not taking: Reported on 12/16/2020)    . oxyCODONE-acetaminophen (PERCOCET/ROXICET) 5-325 MG tablet Take 1 tablet by mouth every 8 (eight) hours as needed for severe pain. (Patient not taking: Reported on 12/16/2020) 9 tablet 0   No current facility-administered medications on file prior to visit.    Allergies:  Allergies  Allergen Reactions  . Statins Other (See Comments)  Myopathy    Vital Signs:  BP 129/85   Pulse (!) 102   Ht 5' 3" (1.6 m)   Wt 225 lb (102.1 kg)   SpO2 100%   BMI 39.86 kg/m   Neurological Exam: MENTAL STATUS including orientation to time, place, person, recent and remote memory, attention span and concentration, language, and fund of knowledge is normal.  Speech is not dysarthric.  CRANIAL NERVES:  Pupils equal round and reactive to light.  Normal conjugate, extra-ocular eye movements in all directions of gaze.  No ptosis. Normal facial sensation.  Face is symmetric. Palate elevates symmetrically.  Tongue is midline.  MOTOR:  No atrophy, fasciculations or abnormal movements.  No pronator drift.  Left thigh incision is open and draining serosanguinous fluid.    Upper Extremity:  Right  Left  Deltoid  4/5   4/5   Biceps  4/5   4/5   Triceps  4/5   4/5   Infraspinatus 4/5  4/5  Medial pectoralis 4/5  4/5  Wrist extensors  5/5   5/5   Wrist flexors  5/5   5/5   Finger extensors  5/5   5/5   Finger flexors  5/5   5/5   Dorsal interossei  5/5   5/5   Abductor pollicis  5/5   5/5   Tone (Ashworth scale)  0  0   Lower Extremity:  Right  Left  Hip flexors  3+/5   3+/5   Knee flexors  5/5   5/5   Knee  extensors  5/5   5/5   Dorsiflexors  5/5   5/5   Plantarflexors  5/5   5/5   Toe extensors  5/5   5/5   Toe flexors  5/5   5/5   Tone (Ashworth scale)  0  0   COORDINATION/GAIT: Gait is slow, stable, assisted with cane.  Data: Labs 08/12/2018:  CK 16213*, aldolase136.8*, ANA neg, MG panel neg, ESR 22, CRP 0.8 Labs 08/22/2018:  CK 9280*, myositis panel III neg, ACE 9, SPEP with IFE, coxsackie panel neg  Lab Results  Component Value Date   CKTOTAL 2,578 (H) 11/29/2020   TROPONINI <0.01 10/03/2019   NCS/EMG of the left arm and leg 09/01/2018:  The electrophysiologic findings are consistent with an irritable myopathy affecting the proximal arm and leg muscles.  Muscle biopsy 11/27/2020: A. MUSCLE, LEFT THIGH, BIOPSY:  Inflammatory myopathy compatible with polymyositis. Because of the patient's age, inclusion body myositis should be a consideration, although features of this disease were not obvious (outside diagnosis from [employer]).    IMPRESSION/PLAN: Polymyositis, confirmed by muscle biopsy.  Weakness involving a limb-girdle distribution, worse in the legs.  She was last to follow-up in 2019 and recently admitted with progressive weakness during which time muscle biopsy was performed.  Findings are most suggestive of polymyositis.  The absence of rimmed vacuoles on biopsy and neurogenic changes on her EMG makes inclusion body myositis very unlikely. She tells me that she is moving to [place], [place] when she is discharged from rehab and I recommended that she establish care with PCP and rheumatology locally, as she will need close follow-up.  Patient has requested to see someone in [place] prior to her move (date uncertain).    Regarding the healing of her her left thigh incision, I recommend that patient follow-up with general surgery.    Total time spent reviewing records, interview, history/exam, documentation, and coordination of care on day of  encounter:  50 min    Thank you for allowing me to participate in patient's care.  If I can answer any additional questions, I would be pleased to do so.    Sincerely,    Arrow Tomko K. Posey Pronto, DO

## 2020-12-17 ENCOUNTER — Telehealth: Payer: Self-pay | Admitting: Neurology

## 2020-12-17 NOTE — Addendum Note (Signed)
Addended by: Armen Pickup A on: 12/17/2020 10:15 AM   Modules accepted: Orders

## 2020-12-17 NOTE — Telephone Encounter (Signed)
Kristine Bauer will send referral to Dr. Estanislado Pandy office.

## 2020-12-17 NOTE — Telephone Encounter (Signed)
Patient called and requested a referral to Dr. Cy Blamer, a rheumatologist.

## 2020-12-17 NOTE — Telephone Encounter (Signed)
Referral created and sent to Dr. Cy Blamer.

## 2021-01-07 ENCOUNTER — Ambulatory Visit (INDEPENDENT_AMBULATORY_CARE_PROVIDER_SITE_OTHER): Payer: 59 | Admitting: Podiatry

## 2021-01-07 ENCOUNTER — Other Ambulatory Visit: Payer: Self-pay

## 2021-01-07 ENCOUNTER — Encounter: Payer: Self-pay | Admitting: Podiatry

## 2021-01-07 DIAGNOSIS — D2372 Other benign neoplasm of skin of left lower limb, including hip: Secondary | ICD-10-CM | POA: Diagnosis not present

## 2021-01-07 DIAGNOSIS — B351 Tinea unguium: Secondary | ICD-10-CM

## 2021-01-07 DIAGNOSIS — D2371 Other benign neoplasm of skin of right lower limb, including hip: Secondary | ICD-10-CM | POA: Diagnosis not present

## 2021-01-07 DIAGNOSIS — M79676 Pain in unspecified toe(s): Secondary | ICD-10-CM | POA: Diagnosis not present

## 2021-01-07 NOTE — Progress Notes (Signed)
She presents today for follow-up of her bilateral foot.  States that she had a muscle biopsy which demonstrated polymyositis she is wants to know how it affects her feet she is currently on gabapentin which is going to be switched to Lyrica tonight she is requesting toenails and callus trim.  Objective: Vital signs are stable she alert oriented x3.  There is no erythema edema/drainage or odor.  Toenails are long thick yellow dystrophic with mycotic with multiple reactive hyperkeratotic lesions bilaterally.  Assessment: Pain in limb secondary to onychomycosis and benign skin lesions.  Plan: Debrided benign skin lesions debrided nails 1 through 5 bilaterally.  We discussed in great detail today how polymyositis affects the musculature of the feet and legs she understands and is amenable to it and we will go ahead and continue with the plans to change over to Lyrica.

## 2021-01-08 ENCOUNTER — Ambulatory Visit: Payer: 59 | Admitting: Cardiovascular Disease

## 2021-01-09 ENCOUNTER — Encounter: Payer: Self-pay | Admitting: Cardiovascular Disease

## 2021-01-09 NOTE — Progress Notes (Signed)
Cardiology Office Note:    Date:  01/10/2021   ID:  Kristine Bauer, DOB 22-Sep-1965, MRN [id number]  PCP:  Nicolette Bang, DO  Cardiologist:  Dawit Tankard  Electrophysiologist:  None   Referring MD: Caryl Never*   Chief Complaint  Patient presents with  . Hypertension    Previous notes:   Kristine Bauer is a 55 y.o. female with a hx of hypertension, diabetes mellitus.  We are asked to see her today by Dr. Juleen China for further evaluation of new onset of chest pain.  Has had some swelling in her legs for the past several months  Both legs, left > right .  She is not been making any effort to avoid salt and salty foods.  Eats bacon, country ham Not particularly short of breath. She is also on Norvasc.  Also has developed some cp. Mid sternal ,  Radiates down left arm . Not associated with exercise , not assoc with eating or drinkin g  She has had an echocardiogram which revealed normal left ventricular systolic function.  She has grade 1 diastolic dysfunction.  Right ventricular function is normal. Trivial mitral regurgitation  Lower extremity venous duplex scans reveal no evidence of DVT in either leg.  BP has been well controlled.  Is customer service at [employer]    Oct. 4, 2021:  Kristine Bauer is feeling well.  Walks to the bus stop to work and then home.  Does not walk or exercise .  Swelling has improved since her last visit  But is still 2-3 +   Jan 10, 2021; Kristine Bauer is seen for follow up visit  Of her chronic diastoic CHF Has leg edema  Wt.  Is  228 lbs  She has been diagnosed with polymyocitis  Has had some tachycardia   Is not not exercising .   Does OT through the week .   Has been in a nursing home for 5 weeks  Following her hospitalization for muscle weakness. Muscle bx shows polymyocitis   Has had more chest wall pain ,  Sharp pains,  Has been eating more salt than she should  Will schedule her for a lexiscan myoview  She will be  moving back to [place] soon,   We may need to cancel the stress test but will see if he can get the myoview done prior to her moving   Past Medical History:  Diagnosis Date  . Allergic rhinitis   . Anemia   . Asthma   . Chest pain    LEFT SIDED-SEVERAL YEARS  . Chronic diastolic heart failure ([place]) 01/01/2020  . Diabetes mellitus, type 2 ([place])   . ETD (Eustachian tube dysfunction), bilateral 04/20/2017  . GERD (gastroesophageal reflux disease)   . HTN (hypertension)   . Hyperlipidemia   . Mild nonproliferative diabetic retinopathy of both eyes ([place])   . Nasal turbinate hypertrophy 04/20/2017  . Vitamin D deficiency     Past Surgical History:  Procedure Laterality Date  . ENDOMETRIAL BIOPSY    . MUSCLE BIOPSY Left 11/27/2020   Procedure: MUSCLE BIOPSY RIGHT THIGH;  Surgeon: Rolm Bookbinder, MD;  Location: [facility];  Service: General;  Laterality: Left;    Current Medications: Current Meds  Medication Sig  . acetaminophen (TYLENOL) 325 MG tablet Take 2 tablets (650 mg total) by mouth every 6 (six) hours as needed for mild pain (or Fever >/= 101).  Marland Kitchen albuterol (VENTOLIN HFA) 108 (90 Base) MCG/ACT inhaler Inhale 1-2 puffs into the  lungs every 6 (six) hours as needed for wheezing or shortness of breath.  . carvedilol (COREG) 6.25 MG tablet Take 1 tablet (6.25 mg total) by mouth 2 (two) times daily.  . CVS D3 50 MCG (2000 UT) CAPS Take 2,000 Units by mouth daily.  Marland Kitchen dextromethorphan-guaiFENesin (ROBITUSSIN-DM) 10-100 MG/5ML liquid Take 10 mLs by mouth every 4 (four) hours as needed for cough. Give 10 ml by mouth every 8 hours as needed for cough and congestion for 14 days.  . Dextrose, Diabetic Use, (INSTA-GLUCOSE) 77.4 % GEL Take by mouth.  . diphenhydrAMINE (BENADRYL) 25 MG tablet Take 25 mg by mouth every 6 (six) hours as needed.  . fluticasone (FLONASE) 50 MCG/ACT nasal spray Place 2 sprays into both nostrils daily.  Marland Kitchen gabapentin (NEURONTIN) 300 MG capsule Take 2 capsules (600 mg  total) by mouth 3 (three) times daily.  Marland Kitchen glucose blood (PRODIGY NO CODING BLOOD GLUC) test strip Use as instructed to check blood sugar twice daily. E11.8  . hydrochlorothiazide (HYDRODIURIL) 25 MG tablet Take 1 tablet (25 mg total) by mouth daily.  . hydrOXYzine (VISTARIL) 25 MG capsule Take 25 mg by mouth 3 (three) times daily as needed.  Marland Kitchen ibuprofen (ADVIL) 400 MG tablet Take 2 tablets (800 mg total) by mouth every 8 (eight) hours as needed for moderate pain.  Marland Kitchen insulin aspart protamine- aspart (NOVOLOG MIX 70/30) (70-30) 100 UNIT/ML injection Inject 0.28 mLs (28 Units total) into the skin 2 (two) times daily with a meal.  . Insulin Pen Needle (TRUEPLUS PEN NEEDLES) 32G X 4 MM MISC Use as instructed to inject insulin daily.  . Lancets (ACCU-CHEK MULTICLIX) lancets 1 each by Other route 3 (three) times daily.  . metFORMIN (GLUCOPHAGE-XR) 500 MG 24 hr tablet Take 3 tablets (1,500 mg total) by mouth daily with breakfast.  . montelukast (SINGULAIR) 10 MG tablet TAKE 1 TABLET BY MOUTH EVERYDAY AT BEDTIME  . Multiple Vitamins-Iron (DAILY MULTIVITAMINS/IRON PO) Take 1 tablet by mouth daily. Centrum Silver 50+ / One A day  . OMEPRAZOLE PO Take 200 mg by mouth daily.  . polyvinyl alcohol (LIQUIFILM TEARS) 1.4 % ophthalmic solution 1 drop as needed for dry eyes.  . pregabalin (LYRICA) 100 MG capsule Take 100 mg by mouth daily.  . sitaGLIPtin (JANUVIA) 100 MG tablet Take 100 mg by mouth daily.  . valsartan (DIOVAN) 160 MG tablet TAKE 1 TABLET BY MOUTH EVERY DAY  . [DISCONTINUED] amLODipine (NORVASC) 5 MG tablet Take 1 tablet (5 mg total) by mouth daily.     Allergies:   Statins   Social History   Socioeconomic History  . Marital status: Single    Spouse name: Not on file  . Number of children: 0  . Years of education: 16  . Highest education level: Bachelor's degree (e.g., BA, AB, BS)  Occupational History  . Occupation: Sales executive: [employer]  Tobacco Use  . Smoking status: Never  Smoker  . Smokeless tobacco: Never Used  Vaping Use  . Vaping Use: Never used  Substance and Sexual Activity  . Alcohol use: No  . Drug use: Never  . Sexual activity: Not Currently  Other Topics Concern  . Not on file  Social History Narrative   Lives in an apartment on the 2nd floor.  No children.  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  Education: college.    Right Handed    Drinks Caffeine    Social Determinants of Health   Financial Resource Strain: Not  on file  Food Insecurity: Not on file  Transportation Needs: Not on file  Physical Activity: Not on file  Stress: Not on file  Social Connections: Not on file     Family History: The patient's family history includes Diabetes type II in her mother; Glaucoma in her father; Hypertension in her father and mother; Multiple sclerosis in her sister. There is no history of Breast cancer.  ROS:   Please see the history of present illness.     All other systems reviewed and are negative.  EKGs/Labs/Other Studies Reviewed:    The following studies were reviewed today:   EKG:    Jan 10, 2021: Sinus tachycardia at 103.  No ST or T wave changes.  No changes from previous EKG.  Recent Labs: 02/09/2020: TSH 1.560 11/24/2020: B Natriuretic Peptide 19.6 12/04/2020: ALT 151; BUN 16; Creatinine, Ser 0.55; Hemoglobin 10.7; Magnesium 1.9; Platelets 360; Potassium 4.5; Sodium 136  Recent Lipid Panel    Component Value Date/Time   CHOL 188 10/03/2019 1642   TRIG 66 10/03/2019 1642   HDL 71 10/03/2019 1642   CHOLHDL 2.6 10/03/2019 1642   CHOLHDL 2.5 Ratio 11/18/2009 2045   VLDL 10 11/18/2009 2045   LDLCALC 105 (H) 10/03/2019 1642    Physical Exam:    Physical Exam: Blood pressure 126/84, pulse (!) 103, height 5\' 3"  (1.6 m), weight 228 lb (103.4 kg), SpO2 99 %.  GEN:  Well nourished, well developed in no acute distress HEENT: Normal NECK: No JVD; No carotid bruits LYMPHATICS: No lymphadenopathy CARDIAC: RRR , no murmurs, rubs,  gallops RESPIRATORY:  Clear to auscultation without rales, wheezing or rhonchi  ABDOMEN: Soft, non-tender, non-distended MUSCULOSKELETAL:   1-2 + leg edema  SKIN: Warm and dry NEUROLOGIC:  Alert and oriented x 3     ASSESSMENT:    1. Precordial pain    PLAN:       1. Chronic diastolic congestive heart failure:   Stable ,  Needs to avoid salt     2.  HTN:     Still has some leg edema .  Will DC amlodipine which might help She has sinus tach . Will add coreg 6. 25 BID    3.  Chest pain:   She has had atypical chest pain off and on.  She is not able to exercise so it is difficult to tell whether this is exercise related.  We will schedule her for a Lexiscan Myoview study.  Because of her health issues she will be moving back home to [place].  Unfortunate I do not have a cardiologist that I can send her to there.  We will see her on an as-needed basis.   Medication Adjustments/Labs and Tests Ordered: Current medicines are reviewed at length with the patient today.  Concerns regarding medicines are outlined above.  Orders Placed This Encounter  Procedures  . MYOCARDIAL PERFUSION IMAGING  . EKG 12-Lead   Meds ordered this encounter  Medications  . carvedilol (COREG) 6.25 MG tablet    Sig: Take 1 tablet (6.25 mg total) by mouth 2 (two) times daily.    Dispense:  180 tablet    Refill:  3    Patient Instructions  Medication Instructions:  Your physician has recommended you make the following change in your medication:   STOP Amlodipine START Coreg 6.25mg  two times daily   *If you need a refill on your cardiac medications before your next appointment, please call your pharmacy*   Lab Work:  none If you have labs (blood work) drawn today and your tests are completely normal, you will receive your results only by: Marland Kitchen MyChart Message (if you have MyChart) OR . A paper copy in the mail If you have any lab test that is abnormal or we need to change your treatment, we  will call you to review the results.   Testing/Procedures: Your physician has requested that you have a lexiscan myoview. For further information please visit [email or web address]. Please follow instruction sheet, as given.    Follow-Up: At [facility], you and your health needs are our priority.  As part of our continuing mission to provide you with exceptional heart care, we have created designated Provider Care Teams.  These Care Teams include your primary Cardiologist (physician) and Advanced Practice Providers (APPs -  Physician Assistants and Nurse Practitioners) who all work together to provide you with the care you need, when you need it.   Your next appointment:    As needed  The format for your next appointment:   In Person  Provider:   You may see Mertie Moores, MD or one of the following Advanced Practice Providers on your designated Care Team:    Richardson Dopp, PA-C  Robbie Lis, [place]     Signed, Mertie Moores, MD  01/10/2021 10:42 AM    [place]

## 2021-01-10 ENCOUNTER — Ambulatory Visit (INDEPENDENT_AMBULATORY_CARE_PROVIDER_SITE_OTHER): Payer: 59 | Admitting: Cardiovascular Disease

## 2021-01-10 ENCOUNTER — Other Ambulatory Visit: Payer: Self-pay

## 2021-01-10 ENCOUNTER — Encounter: Payer: Self-pay | Admitting: Cardiovascular Disease

## 2021-01-10 VITALS — BP 126/84 | HR 103 | Ht 63.0 in | Wt 228.0 lb

## 2021-01-10 DIAGNOSIS — I5032 Chronic diastolic (congestive) heart failure: Secondary | ICD-10-CM | POA: Diagnosis not present

## 2021-01-10 DIAGNOSIS — I10 Essential (primary) hypertension: Secondary | ICD-10-CM | POA: Diagnosis not present

## 2021-01-10 DIAGNOSIS — R079 Chest pain, unspecified: Secondary | ICD-10-CM | POA: Diagnosis not present

## 2021-01-10 DIAGNOSIS — R072 Precordial pain: Secondary | ICD-10-CM | POA: Diagnosis not present

## 2021-01-10 MED ORDER — CARVEDILOL 6.25 MG PO TABS: 6.2500 mg | ORAL_TABLET | Freq: Two times a day (BID) | ORAL | 3 refills | Status: AC

## 2021-01-10 NOTE — Patient Instructions (Signed)
Medication Instructions:  Your physician has recommended you make the following change in your medication:   STOP Amlodipine START Coreg 6.25mg  two times daily   *If you need a refill on your cardiac medications before your next appointment, please call your pharmacy*   Lab Work: none If you have labs (blood work) drawn today and your tests are completely normal, you will receive your results only by: Marland Kitchen MyChart Message (if you have MyChart) OR . A paper copy in the mail If you have any lab test that is abnormal or we need to change your treatment, we will call you to review the results.   Testing/Procedures: Your physician has requested that you have a lexiscan myoview. For further information please visit [email or web address]. Please follow instruction sheet, as given.    Follow-Up: At [facility], you and your health needs are our priority.  As part of our continuing mission to provide you with exceptional heart care, we have created designated Provider Care Teams.  These Care Teams include your primary Cardiologist (physician) and Advanced Practice Providers (APPs -  Physician Assistants and Nurse Practitioners) who all work together to provide you with the care you need, when you need it.   Your next appointment:    As needed  The format for your next appointment:   In Person  Provider:   You may see Mertie Moores, MD or one of the following Advanced Practice Providers on your designated Care Team:    Richardson Dopp, PA-C  [place], [place]

## 2021-01-16 ENCOUNTER — Telehealth (HOSPITAL_COMMUNITY): Payer: Self-pay | Admitting: *Deleted

## 2021-01-16 ENCOUNTER — Encounter (HOSPITAL_COMMUNITY): Payer: Self-pay | Admitting: *Deleted

## 2021-01-16 NOTE — Telephone Encounter (Signed)
Letter sent via MyChart outlining instructions for upcoming stress test scheduled for 01/20/21 at 1:00.

## 2021-01-20 ENCOUNTER — Ambulatory Visit (HOSPITAL_COMMUNITY): Payer: 59 | Attending: Cardiology

## 2021-01-20 ENCOUNTER — Other Ambulatory Visit: Payer: Self-pay

## 2021-01-20 ENCOUNTER — Ambulatory Visit (HOSPITAL_COMMUNITY): Payer: 59

## 2021-01-20 DIAGNOSIS — R072 Precordial pain: Secondary | ICD-10-CM | POA: Diagnosis not present

## 2021-01-21 ENCOUNTER — Ambulatory Visit (HOSPITAL_COMMUNITY): Payer: 59 | Attending: Cardiovascular Disease

## 2021-01-21 ENCOUNTER — Ambulatory Visit (HOSPITAL_COMMUNITY): Payer: 59

## 2021-01-21 LAB — MYOCARDIAL PERFUSION IMAGING
LV dias vol: 71 mL (ref 46–106)
LV sys vol: 33 mL
Peak HR: 127 {beats}/min
Rest HR: 106 {beats}/min
SDS: 0
SRS: 0
SSS: 0
TID: 0.99

## 2021-01-22 ENCOUNTER — Telehealth: Payer: Self-pay | Admitting: Cardiovascular Disease

## 2021-01-22 NOTE — Telephone Encounter (Signed)
Patient left a voicemail on 01/21/21 about an address update for [place]. Patient was called back and spoke to about updating address. Address was updated in Epic. Patient verbally requested receiving requested records via e-mail on file instead of being mailed.  AO 01/22/21

## 2021-01-28 ENCOUNTER — Ambulatory Visit (HOSPITAL_COMMUNITY): Payer: 59

## 2021-01-29 ENCOUNTER — Ambulatory Visit (HOSPITAL_COMMUNITY): Payer: 59

## 2021-02-11 ENCOUNTER — Ambulatory Visit: Payer: 59 | Admitting: Internal Medicine

## 2024-07-08 DEATH — deceased
